# Patient Record
Sex: Male | Born: 1960 | Race: White | Hispanic: No | Smoking: Current every day smoker
Health system: Southern US, Community
[De-identification: ages and names within clinical notes are randomized; demographics above are authoritative.]

## PROBLEM LIST (undated history)

## (undated) DIAGNOSIS — H409 Unspecified glaucoma: Secondary | ICD-10-CM

## (undated) DIAGNOSIS — J439 Emphysema, unspecified: Secondary | ICD-10-CM

## (undated) DIAGNOSIS — M1990 Unspecified osteoarthritis, unspecified site: Secondary | ICD-10-CM

## (undated) DIAGNOSIS — A159 Respiratory tuberculosis unspecified: Secondary | ICD-10-CM

## (undated) DIAGNOSIS — R918 Other nonspecific abnormal finding of lung field: Secondary | ICD-10-CM

## (undated) DIAGNOSIS — J45909 Unspecified asthma, uncomplicated: Secondary | ICD-10-CM

## (undated) DIAGNOSIS — J449 Chronic obstructive pulmonary disease, unspecified: Secondary | ICD-10-CM

## (undated) DIAGNOSIS — K759 Inflammatory liver disease, unspecified: Secondary | ICD-10-CM

## (undated) DIAGNOSIS — F1910 Other psychoactive substance abuse, uncomplicated: Secondary | ICD-10-CM

## (undated) DIAGNOSIS — I10 Essential (primary) hypertension: Secondary | ICD-10-CM

## (undated) HISTORY — PX: EYE SURGERY: SHX253

## (undated) HISTORY — DX: Essential (primary) hypertension: I10

## (undated) HISTORY — DX: Chronic obstructive pulmonary disease, unspecified: J44.9

## (undated) HISTORY — DX: Unspecified glaucoma: H40.9

## (undated) HISTORY — DX: Emphysema, unspecified: J43.9

## (undated) HISTORY — DX: Other psychoactive substance abuse, uncomplicated: F19.10

## (undated) HISTORY — PX: HERNIA REPAIR: SHX51

---

## 2015-08-03 ENCOUNTER — Encounter: Payer: Self-pay | Admitting: Physician Assistant

## 2015-08-03 ENCOUNTER — Ambulatory Visit: Payer: Self-pay | Admitting: Physician Assistant

## 2015-08-03 VITALS — BP 120/76 | HR 56 | Temp 97.9°F | Ht 70.5 in | Wt 168.2 lb

## 2015-08-03 DIAGNOSIS — Z13220 Encounter for screening for lipoid disorders: Secondary | ICD-10-CM

## 2015-08-03 DIAGNOSIS — F17218 Nicotine dependence, cigarettes, with other nicotine-induced disorders: Secondary | ICD-10-CM

## 2015-08-03 DIAGNOSIS — R21 Rash and other nonspecific skin eruption: Secondary | ICD-10-CM

## 2015-08-03 DIAGNOSIS — Z125 Encounter for screening for malignant neoplasm of prostate: Secondary | ICD-10-CM

## 2015-08-03 DIAGNOSIS — J449 Chronic obstructive pulmonary disease, unspecified: Secondary | ICD-10-CM

## 2015-08-03 DIAGNOSIS — F1020 Alcohol dependence, uncomplicated: Secondary | ICD-10-CM | POA: Insufficient documentation

## 2015-08-03 DIAGNOSIS — F1021 Alcohol dependence, in remission: Secondary | ICD-10-CM

## 2015-08-03 DIAGNOSIS — Z131 Encounter for screening for diabetes mellitus: Secondary | ICD-10-CM

## 2015-08-03 LAB — GLUCOSE, POCT (MANUAL RESULT ENTRY): POC Glucose: 72 mg/dl (ref 70–99)

## 2015-08-03 MED ORDER — ALBUTEROL SULFATE (2.5 MG/3ML) 0.083% IN NEBU: 2.5000 mg | INHALATION_SOLUTION | Freq: Four times a day (QID) | RESPIRATORY_TRACT | Status: DC | PRN

## 2015-08-03 MED ORDER — MOMETASONE FURO-FORMOTEROL FUM 200-5 MCG/ACT IN AERO: 2.0000 | INHALATION_SPRAY | Freq: Two times a day (BID) | RESPIRATORY_TRACT | Status: DC

## 2015-08-03 MED ORDER — TRIAMCINOLONE ACETONIDE 0.1 % EX CREA: 1.0000 "application " | TOPICAL_CREAM | Freq: Two times a day (BID) | CUTANEOUS | Status: DC

## 2015-08-03 NOTE — Progress Notes (Signed)
BP 120/76 mmHg  Pulse 56  Temp(Src) 97.9 F (36.6 C)  Ht 5' 10.5" (1.791 m)  Wt 168 lb 3.2 oz (76.295 kg)  BMI 23.79 kg/m2  SpO2 97%   Subjective:    Patient ID: Derek [name], male    DOB: 1960-10-20, 54 y.o.   MRN: [id number]  HPI: Derek [name] is a 54 y.o. male presenting on 08/03/2015 for New Patient (Initial Visit); Rash; and Hepatitis C   HPI  Chief Complaint  Patient presents with  . New Patient (Initial Visit)  . Rash    pt states he is sensitive to the sun. pt has rash on his back.  . Hepatitis C    pt thinks he may have hep c. due to pt being a previous heavy drinker and "turning yellow" 27 years ago.     -Pt is going to [facility] (who is prescribing his Naltrexone) -pt says the rash on his back comes and goes. He says it itches and burns -he states he is sensitive to the sun.  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 Interim medical history since our last visit reviewed. Allergies and medications reviewed and updated.   Current outpatient prescriptions:  .  albuterol (PROVENTIL) (2.5 MG/3ML) 0.083% nebulizer solution, Take 2.5 mg by nebulization every 6 (six) hours as needed for wheezing or shortness of breath., Disp: , Rfl:  .  Naltrexone HCl (REVIA PO), Take by mouth daily., Disp: , Rfl:    Review of Systems  Constitutional: Negative for fever, chills, diaphoresis, appetite change, fatigue and unexpected weight change.  HENT: Positive for congestion. Negative for dental problem, drooling, ear pain, facial swelling, hearing loss, mouth sores, sneezing, sore throat, trouble swallowing and voice change.   Eyes: Negative for pain, discharge, redness, itching and visual disturbance.   Respiratory: Positive for cough, shortness of breath and wheezing. Negative for choking.   Cardiovascular: Negative for chest pain, palpitations and leg swelling.  Gastrointestinal: Negative for vomiting, abdominal pain, diarrhea, constipation and blood in stool.  Endocrine: Positive for cold intolerance and heat intolerance. Negative for polydipsia.  Genitourinary: Negative for dysuria, hematuria and decreased urine volume.  Musculoskeletal: Positive for back pain. Negative for arthralgias and gait problem.  Skin: Positive for rash.  Allergic/Immunologic: Negative for environmental allergies.  Neurological: Negative for seizures, syncope, light-headedness and headaches.  Hematological: Negative for adenopathy.  Psychiatric/Behavioral: Negative for suicidal ideas, dysphoric mood and agitation. The patient is not nervous/anxious.     Per HPI unless specifically indicated above     Objective:    BP 120/76 mmHg  Pulse 56  Temp(Src) 97.9 F (36.6 C)  Ht 5' 10.5" (1.791 m)  Wt 168 lb 3.2 oz (76.295 kg)  BMI 23.79 kg/m2  SpO2 97%  Wt Readings from Last 3 Encounters:  08/03/15 168 lb 3.2 oz (76.295 kg)    Physical Exam  Constitutional: He is oriented to person, place, and time. Vital signs are normal.  Appears older than stated age  HENT:  Head: Normocephalic and atraumatic.  Mouth/Throat: Oropharynx is clear and moist. No oropharyngeal exudate.  Eyes: Conjunctivae and EOM are normal. Pupils are equal, round, and reactive to light.  Neck: Neck supple.  No thyromegaly present.  Cardiovascular: Normal rate and regular rhythm.   Pulmonary/Chest: Effort normal and breath sounds normal. He has no wheezes. He has no rales.  Abdominal: Soft. Bowel sounds are normal. He exhibits no mass. There is no hepatosplenomegaly. There is no tenderness.  Musculoskeletal: He exhibits no edema.  Lymphadenopathy:    He has no cervical adenopathy.  Neurological: He is alert and oriented to person,  place, and time.  Skin: Skin is warm and dry. Rash noted.  Significant sun damage over all exposed skin.  Possible rosecea on nose with early rhinophyma.   There are dry areas on L flank and lower back which also have multiple tiny scabs and some excoriation.    Psychiatric: He has a normal mood and affect. His behavior is normal. Thought content normal.  Vitals reviewed.   R neck mass- 2 cm soft mobile nontender   Results for orders placed or performed in visit on 08/03/15  POCT Glucose (CBG)  Result Value Ref Range   POC Glucose 72 70 - 99 mg/dl      Assessment & Plan:   Encounter Diagnoses  Name Primary?  . Alcoholism in remission ([place])   . Chronic obstructive pulmonary disease, unspecified COPD type ([place]) Yes  . Cigarette nicotine dependence with other nicotine-induced disorder   . Screening for diabetes mellitus   . Screening cholesterol level   . Screening for prostate cancer   . Rash and nonspecific skin eruption     -Get baseline labs including hepatitis screen -Needs cxr -pt given [facility] discount application  -Counseled smoking cessation -rx TAC for rash -Gave sample dulera. Set up for [facility] and order from there. -rx Albuterol for nebs sent to [facility] -Likely lipoma on neck-  On list for eval but farther down priority list -F/u 1 mo

## 2015-08-03 NOTE — Patient Instructions (Addendum)
-get blood labs done -turn in [facility] application -get nebulizer medicine and cream for rash from pharmacy -get chest x-ray done   Smoking Cessation, Tips for Success If you are ready to quit smoking, congratulations! You have chosen to help yourself be healthier. Cigarettes bring nicotine, tar, carbon monoxide, and other irritants into your body. Your lungs, heart, and blood vessels will be able to work better without these poisons. There are many different ways to quit smoking. Nicotine gum, nicotine patches, a nicotine inhaler, or nicotine nasal spray can help with physical craving. Hypnosis, support groups, and medicines help break the habit of smoking. WHAT THINGS CAN I DO TO MAKE QUITTING EASIER?  Here are some tips to help you quit for good:  Pick a date when you will quit smoking completely. Tell all of your friends and family about your plan to quit on that date.  Do not try to slowly cut down on the number of cigarettes you are smoking. Pick a quit date and quit smoking completely starting on that day.  Throw away all cigarettes.   Clean and remove all ashtrays from your home, work, and car.  On a card, write down your reasons for quitting. Carry the card with you and read it when you get the urge to smoke.  Cleanse your body of nicotine. Drink enough water and fluids to keep your urine clear or pale yellow. Do this after quitting to flush the nicotine from your body.  Learn to predict your moods. Do not let a bad situation be your excuse to have a cigarette. Some situations in your life might tempt you into wanting a cigarette.  Never have "just one" cigarette. It leads to wanting another and another. Remind yourself of your decision to quit.  Change habits associated with smoking. If you smoked while driving or when feeling stressed, try other activities to replace smoking. Stand up when drinking your coffee. Brush your teeth after eating. Sit in a different chair when  you read the paper. Avoid alcohol while trying to quit, and try to drink fewer caffeinated beverages. Alcohol and caffeine may urge you to smoke.  Avoid foods and drinks that can trigger a desire to smoke, such as sugary or spicy foods and alcohol.  Ask people who smoke not to smoke around you.  Have something planned to do right after eating or having a cup of coffee. For example, plan to take a walk or exercise.  Try a relaxation exercise to calm you down and decrease your stress. Remember, you may be tense and nervous for the first 2 weeks after you quit, but this will pass.  Find new activities to keep your hands busy. Play with a pen, coin, or rubber band. Doodle or draw things on paper.  Brush your teeth right after eating. This will help cut down on the craving for the taste of tobacco after meals. You can also try mouthwash.   Use oral substitutes in place of cigarettes. Try using lemon drops, carrots, cinnamon sticks, or chewing gum. Keep them handy so they are available when you have the urge to smoke.  When you have the urge to smoke, try deep breathing.  Designate your home as a nonsmoking area.  If you are a heavy smoker, ask your health care provider about a prescription for nicotine chewing gum. It can ease your withdrawal from nicotine.  Reward yourself. Set aside the cigarette money you save and buy yourself something nice.  Look for  support from others. Join a support group or smoking cessation program. Ask someone at home or at work to help you with your plan to quit smoking.  Always ask yourself, "Do I need this cigarette or is this just a reflex?" Tell yourself, "Today, I choose not to smoke," or "I do not want to smoke." You are reminding yourself of your decision to quit.  Do not replace cigarette smoking with electronic cigarettes (commonly called e-cigarettes). The safety of e-cigarettes is unknown, and some may contain harmful chemicals.  If you relapse, do  not give up! Plan ahead and think about what you will do the next time you get the urge to smoke. HOW WILL I FEEL WHEN I QUIT SMOKING? You may have symptoms of withdrawal because your body is used to nicotine (the addictive substance in cigarettes). You may crave cigarettes, be irritable, feel very hungry, cough often, get headaches, or have difficulty concentrating. The withdrawal symptoms are only temporary. They are strongest when you first quit but will go away within 10-14 days. When withdrawal symptoms occur, stay in control. Think about your reasons for quitting. Remind yourself that these are signs that your body is healing and getting used to being without cigarettes. Remember that withdrawal symptoms are easier to treat than the major diseases that smoking can cause.  Even after the withdrawal is over, expect periodic urges to smoke. However, these cravings are generally short lived and will go away whether you smoke or not. Do not smoke! WHAT RESOURCES ARE AVAILABLE TO HELP ME QUIT SMOKING? Your health care provider can direct you to community resources or hospitals for support, which may include:  Group support.  Education.  Hypnosis.  Therapy.   This information is not intended to replace advice given to you by your health care provider. Make sure you discuss any questions you have with your health care provider.   Document Released: 05/12/2004 Document Revised: 09/04/2014 Document Reviewed: 01/30/2013 Elsevier Interactive Patient Education [employer].

## 2015-08-12 LAB — CBC WITH DIFFERENTIAL/PLATELET
BASOS ABS: 0.1 10*3/uL (ref 0.0–0.1)
BASOS PCT: 1 % (ref 0–1)
EOS PCT: 4 % (ref 0–5)
Eosinophils Absolute: 0.3 10*3/uL (ref 0.0–0.7)
HEMATOCRIT: 50.8 % (ref 39.0–52.0)
Hemoglobin: 17.1 g/dL — ABNORMAL HIGH (ref 13.0–17.0)
LYMPHS PCT: 31 % (ref 12–46)
Lymphs Abs: 2.6 10*3/uL (ref 0.7–4.0)
MCH: 33.1 pg (ref 26.0–34.0)
MCHC: 33.7 g/dL (ref 30.0–36.0)
MCV: 98.3 fL (ref 78.0–100.0)
MPV: 10.4 fL (ref 8.6–12.4)
Monocytes Absolute: 0.9 10*3/uL (ref 0.1–1.0)
Monocytes Relative: 11 % (ref 3–12)
Neutro Abs: 4.5 10*3/uL (ref 1.7–7.7)
Neutrophils Relative %: 53 % (ref 43–77)
PLATELETS: 341 10*3/uL (ref 150–400)
RBC: 5.17 MIL/uL (ref 4.22–5.81)
RDW: 13.4 % (ref 11.5–15.5)
WBC: 8.5 10*3/uL (ref 4.0–10.5)

## 2015-08-13 LAB — LIPID PANEL
CHOL/HDL RATIO: 8.8 ratio — AB (ref <=5.0)
CHOLESTEROL: 149 mg/dL (ref 125–200)
HDL: 17 mg/dL — ABNORMAL LOW (ref >=40)
LDL Cholesterol: 111 mg/dL (ref <130)
TRIGLYCERIDES: 105 mg/dL (ref <150)
VLDL: 21 mg/dL (ref <30)

## 2015-08-13 LAB — PSA: PSA: 0.78 ng/mL (ref <=4.00)

## 2015-08-13 LAB — COMPLETE METABOLIC PANEL WITH GFR
ALBUMIN: 3.7 g/dL (ref 3.6–5.1)
ALK PHOS: 76 U/L (ref 40–115)
ALT: 86 U/L — AB (ref 9–46)
AST: 49 U/L — ABNORMAL HIGH (ref 10–35)
BUN: 9 mg/dL (ref 7–25)
CALCIUM: 9.6 mg/dL (ref 8.6–10.3)
CHLORIDE: 103 mmol/L (ref 98–110)
CO2: 31 mmol/L (ref 20–31)
CREATININE: 0.77 mg/dL (ref 0.70–1.33)
GFR, Est Non African American: >89 mL/min (ref >=60)
Glucose, Bld: 91 mg/dL (ref 65–99)
POTASSIUM: 4.9 mmol/L (ref 3.5–5.3)
Sodium: 142 mmol/L (ref 135–146)
Total Bilirubin: 0.6 mg/dL (ref 0.2–1.2)
Total Protein: 7.9 g/dL (ref 6.1–8.1)

## 2015-08-13 LAB — HEPATITIS PANEL, ACUTE
HCV AB: REACTIVE — AB
HEP B C IGM: NONREACTIVE
HEP B S AG: NEGATIVE
Hep A IgM: NONREACTIVE

## 2015-08-15 LAB — HEPATITIS C RNA QUANTITATIVE
HCV QUANT: 1530809 [IU]/mL — AB (ref <15)
HCV Quantitative Log: 6.18 {Log} — ABNORMAL HIGH (ref <1.18)

## 2015-09-02 ENCOUNTER — Encounter: Payer: Self-pay | Admitting: Physician Assistant

## 2015-09-02 ENCOUNTER — Ambulatory Visit: Payer: Self-pay | Admitting: Physician Assistant

## 2015-09-02 VITALS — BP 112/72 | HR 87 | Temp 98.1°F | Ht 70.5 in | Wt 166.0 lb

## 2015-09-02 DIAGNOSIS — F1021 Alcohol dependence, in remission: Secondary | ICD-10-CM

## 2015-09-02 DIAGNOSIS — F17218 Nicotine dependence, cigarettes, with other nicotine-induced disorders: Secondary | ICD-10-CM

## 2015-09-02 DIAGNOSIS — B1920 Unspecified viral hepatitis C without hepatic coma: Secondary | ICD-10-CM

## 2015-09-02 DIAGNOSIS — J449 Chronic obstructive pulmonary disease, unspecified: Secondary | ICD-10-CM

## 2015-09-02 MED ORDER — TRIAMCINOLONE ACETONIDE 0.5 % EX CREA: 1.0000 "application " | TOPICAL_CREAM | Freq: Every day | CUTANEOUS | Status: DC

## 2015-09-02 NOTE — Progress Notes (Signed)
BP 112/72 mmHg  Pulse 87  Temp(Src) 98.1 F (36.7 C)  Ht 5' 10.5" (1.791 m)  Wt 166 lb (75.297 kg)  BMI 23.47 kg/m2  SpO2 92%   Subjective:    Patient ID: Derek [name], male    DOB: May 14, 1961, 55 y.o.   MRN: [id number]  HPI: Linn Goetze is a 55 y.o. male presenting on 09/02/2015 for COPD   HPI Pt states breathing much better on dulera.    Pt is still off etoh.  States dry x 96 days now.  Relevant past medical, surgical, family and social history reviewed and updated as indicated. Interim medical history since our last visit reviewed. Allergies and medications reviewed and updated.  Review of Systems  Constitutional: Positive for fever, chills, diaphoresis, appetite change, fatigue and unexpected weight change.  HENT: Positive for congestion. Negative for dental problem, drooling, ear pain, facial swelling, hearing loss, mouth sores, sneezing, sore throat, trouble swallowing and voice change.   Eyes: Negative for pain, discharge, redness, itching and visual disturbance.  Respiratory: Positive for cough, shortness of breath and wheezing. Negative for choking.   Cardiovascular: Positive for chest pain. Negative for palpitations and leg swelling.  Gastrointestinal: Negative for vomiting, abdominal pain, diarrhea, constipation and blood in stool.  Endocrine: Positive for cold intolerance, heat intolerance and polydipsia.  Genitourinary: Negative for dysuria, hematuria and decreased urine volume.  Musculoskeletal: Positive for back pain. Negative for arthralgias and gait problem.  Skin: Positive for rash.  Allergic/Immunologic: Negative for environmental allergies.  Neurological: Negative for seizures, syncope, light-headedness and headaches.  Hematological: Negative for adenopathy.  Psychiatric/Behavioral: Positive for dysphoric mood. Negative for suicidal ideas and agitation. The patient is nervous/anxious.     Per HPI unless specifically indicated above    Objective:    BP 112/72 mmHg  Pulse 87  Temp(Src) 98.1 F (36.7 C)  Ht 5' 10.5" (1.791 m)  Wt 166 lb (75.297 kg)  BMI 23.47 kg/m2  SpO2 92%  Wt Readings from Last 3 Encounters:  09/02/15 166 lb (75.297 kg)  08/03/15 168 lb 3.2 oz (76.295 kg)    Physical Exam  Constitutional: He is oriented to person, place, and time. He appears well-developed and well-nourished.  HENT:  Head: Normocephalic and atraumatic.  Neck: Neck supple.  Cardiovascular: Normal rate and regular rhythm.   Pulmonary/Chest: Effort normal. No respiratory distress. He has wheezes.  Abdominal: Soft. Bowel sounds are normal. There is no tenderness.  Musculoskeletal: He exhibits no edema.  Lymphadenopathy:    He has no cervical adenopathy.  Neurological: He is alert and oriented to person, place, and time.  Skin: Skin is warm and dry.  Psychiatric: He has a normal mood and affect. His behavior is normal.  Vitals reviewed.   Results for orders placed or performed in visit on 08/03/15  Lipid Profile  Result Value Ref Range   Cholesterol 149 125 - 200 mg/dL   Triglycerides 105 <150 mg/dL   HDL 17 (L) >=40 mg/dL   Total CHOL/HDL Ratio 8.8 (H) <=5.0 Ratio   VLDL 21 <30 mg/dL   LDL Cholesterol 111 <130 mg/dL  COMPLETE METABOLIC PANEL WITH GFR  Result Value Ref Range   Sodium 142 135 - 146 mmol/L   Potassium 4.9 3.5 - 5.3 mmol/L   Chloride 103 98 - 110 mmol/L   CO2 31 20 - 31 mmol/L   Glucose, Bld 91 65 - 99 mg/dL   BUN 9 7 - 25 mg/dL   Creat 0.77 0.70 -  1.33 mg/dL   Total Bilirubin 0.6 0.2 - 1.2 mg/dL   Alkaline Phosphatase 76 40 - 115 U/L   AST 49 (H) 10 - 35 U/L   ALT 86 (H) 9 - 46 U/L   Total Protein 7.9 6.1 - 8.1 g/dL   Albumin 3.7 3.6 - 5.1 g/dL   Calcium 9.6 8.6 - 10.3 mg/dL   GFR, Est African American >89 >=60 mL/min   GFR, Est Non African American >89 >=60 mL/min  CBC w/Diff/Platelet  Result Value Ref Range   WBC 8.5 4.0 - 10.5 K/uL   RBC 5.17 4.22 - 5.81 MIL/uL   Hemoglobin 17.1 (H) 13.0  - 17.0 g/dL   HCT 50.8 39.0 - 52.0 %   MCV 98.3 78.0 - 100.0 fL   MCH 33.1 26.0 - 34.0 pg   MCHC 33.7 30.0 - 36.0 g/dL   RDW 13.4 11.5 - 15.5 %   Platelets 341 150 - 400 K/uL   MPV 10.4 8.6 - 12.4 fL   Neutrophils Relative % 53 43 - 77 %   Neutro Abs 4.5 1.7 - 7.7 K/uL   Lymphocytes Relative 31 12 - 46 %   Lymphs Abs 2.6 0.7 - 4.0 K/uL   Monocytes Relative 11 3 - 12 %   Monocytes Absolute 0.9 0.1 - 1.0 K/uL   Eosinophils Relative 4 0 - 5 %   Eosinophils Absolute 0.3 0.0 - 0.7 K/uL   Basophils Relative 1 0 - 1 %   Basophils Absolute 0.1 0.0 - 0.1 K/uL   Smear Review Criteria for review not met   Hepatitis, Acute  Result Value Ref Range   Hepatitis B Surface Ag NEGATIVE NEGATIVE   HCV Ab REACTIVE (A) NEGATIVE   Hep B C IgM NON REACTIVE NON REACTIVE   Hep A IgM NON REACTIVE NON REACTIVE  PSA  Result Value Ref Range   PSA 0.78 <=4.00 ng/mL  Hepatitis C RNA quantitative  Result Value Ref Range   HCV Quantitative 3151761 (H) <15 IU/mL   HCV Quantitative Log 6.18 (H) <1.18 log 10  POCT Glucose (CBG)  Result Value Ref Range   POC Glucose 72 70 - 99 mg/dl      Assessment & Plan:   Encounter Diagnoses  Name Primary?  . Chronic obstructive pulmonary disease, unspecified COPD type ([place]) Yes  . Cigarette nicotine dependence with other nicotine-induced disorder   . Hepatitis C virus infection without hepatic coma, unspecified chronicity   . Alcoholism in remission [facility])      -reviewed labs with pt  -Pt to get CXR done. -Refer to GI for treatment hepatitis -Counseled on smoking cessation -counseled on Lowfat diet -f/u 3 mo. RTO sooner prn

## 2015-09-02 NOTE — Patient Instructions (Signed)

## 2015-09-06 ENCOUNTER — Ambulatory Visit (HOSPITAL_COMMUNITY)
Admission: RE | Admit: 2015-09-06 | Discharge: 2015-09-06 | Disposition: A | Discharge status: Home / Self Care | Payer: Self-pay | Source: Ambulatory Visit | Attending: Physician Assistant | Admitting: Physician Assistant

## 2015-09-06 DIAGNOSIS — J45909 Unspecified asthma, uncomplicated: Secondary | ICD-10-CM | POA: Insufficient documentation

## 2015-09-06 DIAGNOSIS — R918 Other nonspecific abnormal finding of lung field: Secondary | ICD-10-CM | POA: Insufficient documentation

## 2015-09-06 DIAGNOSIS — B182 Chronic viral hepatitis C: Secondary | ICD-10-CM | POA: Insufficient documentation

## 2015-09-06 DIAGNOSIS — R05 Cough: Secondary | ICD-10-CM | POA: Insufficient documentation

## 2015-09-20 ENCOUNTER — Encounter: Payer: Self-pay | Admitting: Gastroenterology

## 2015-10-07 ENCOUNTER — Ambulatory Visit (INDEPENDENT_AMBULATORY_CARE_PROVIDER_SITE_OTHER): Payer: Self-pay | Admitting: Gastroenterology

## 2015-10-07 ENCOUNTER — Other Ambulatory Visit: Payer: Self-pay

## 2015-10-07 ENCOUNTER — Encounter: Payer: Self-pay | Admitting: Gastroenterology

## 2015-10-07 VITALS — BP 128/80 | HR 77 | Temp 96.9°F | Ht 72.0 in | Wt 167.6 lb

## 2015-10-07 DIAGNOSIS — Z1211 Encounter for screening for malignant neoplasm of colon: Secondary | ICD-10-CM

## 2015-10-07 DIAGNOSIS — B182 Chronic viral hepatitis C: Secondary | ICD-10-CM

## 2015-10-07 NOTE — Progress Notes (Signed)
cc'ed to pcp °

## 2015-10-07 NOTE — Assessment & Plan Note (Addendum)
LIKELY CONTRACTED > 20 YRS AND ASSOCIATED WITH ETOH ABUSE AND ADDITIONAL POLYSUBSTANCE USE.  NEED HEP C GENOTYPE & PT/INR, HEP B sAb AND TOTAL HEP A Ab. RUQ U/S TO ASSESS FOR CIRRHOSIS. EGD TO SCREEN FOR BARRETT'S AND VARICES. DISCUSSED PROCEDURE, BENEFITS, & RISKS: < 1% chance of medication reaction, PERFORATION, OR bleeding. FOLLOW UP IN 3 MOS.   GREATER THAN 50% WAS SPENT IN COUNSELING & COORDINATION OF CARE WITH THE PATIENT: DISCUSSED DIFFERENTIAL DIAGNOSIS, PROCEDURE, BENEFITS, RISKS, AND MANAGEMENT OF HEPATITIS C, BARRETT'S ESOPHAGUS, COLON CANCER SCREENING. TOTAL ENCOUNTER TIME: 41 MINS.

## 2015-10-07 NOTE — Patient Instructions (Addendum)
Your ENDOSCOPY NEEDS TO BE WITH PROPOFOL. YOU WILL NEED TO COME FOR A PREOP VISIT AND THEY WILL DRAW YOUR LABS AT THAT TIME. YOUR ENDOSCOPY WIILL BE MAR 14.  COMPLETE ultrasound IN 2-3 WEEKS.  FOLLOW UP IN 4 MOS.

## 2015-10-07 NOTE — Progress Notes (Signed)
ON RECALL  °

## 2015-10-07 NOTE — Assessment & Plan Note (Addendum)
AVERAGE RISK. NO WARNING SIGNS/SYMPTOMS  TCS W/ MAC DUE TO POLYSUBSTANCE ABUSE. LATE MAR 2017 OR EARLY APR 2017. DISCUSSED PROCEDURE, BENEFITS, & RISKS: < 1% chance of medication reaction, bleeding, perforation, or rupture of spleen/liver. APPLYING FOR [facility] ASSISTANCE FOLLOW UP IN 4 MOS.

## 2015-10-07 NOTE — Progress Notes (Addendum)
Subjective:    Patient ID: Derek [name], male    DOB: 1960-09-30, 55 y.o.   MRN: [id number]  Soyla Dryer, PA-C  HPI Was using MDA and cocaine IN THE 70s. Grew up in [place], AT AGE 17 TURNED [place] 2-3 WEEKS. WENT TO EMERGENCY CARE. ETOH: DRY FOR 126 DAYS. MAY HAVE DARK URINE. NEVER HAD A COLONOSCOPY. BMs: REGULAR. HAS COPD: 1-2 PKS/DAY FOR 40 YRS. USU WEIGHT: 160-170 LBS. MARIJUANA YESTERDAY. NO OTHER RECREATIONAL DRUGS. RARE HEARTBURN.  PT DENIES FEVER, CHILLS, HEMATOCHEZIA, nausea, vomiting, melena, diarrhea, CHEST PAIN, SHORTNESS OF BREATH, WEIGHT LOSS, CHANGE IN BOWEL IN HABITS, constipation, abdominal pain, problems swallowing, OR problems with sedation.  Past Medical History  Diagnosis Date  . COPD (chronic obstructive pulmonary disease) ([place])   . Substance abuse    Past Surgical History  Procedure Laterality Date  . Hernia repair Left   . Eye surgery Right    No Known Allergies  Current Outpatient Prescriptions  Medication Sig Dispense Refill  . albuterol (PROVENTIL) (2.5 MG/3ML) 0.083% nebulizer solution Take 3 mLs (2.5 mg total) by nebulization every 6 (six) hours as needed for wheezing or shortness of breath.    . mometasone-formoterol (DULERA) 200-5 MCG/ACT AERO Inhale 2 puffs into the lungs 2 (two) times daily.    . Naltrexone HCl (REVIA PO) Take by mouth daily. TO HELP IN STOP DRINKING    .      Marland Kitchen       Family History  Problem Relation Age of Onset  . Cancer Mother     lymph nodes  . Ulcers Father   . Cholecystitis Father   . Colon cancer Neg Hx   . Colon polyps Neg Hx      Social History   Social History  . Marital Status: Married    Spouse Name: N/A  . Number of Children: N/A  . Years of Education: N/A   Social History Main Topics  . Smoking status: Current Every Day Smoker -- 2.00 packs/day for 40 years    Types: Cigarettes  . Smokeless tobacco: Never Used  . Alcohol Use: No     Comment: hx alcoholism. sober  since 04-2015  . Drug Use: Yes    Special: Marijuana, Cocaine  . Sexual Activity: Not Asked   Other Topics Concern  . None   Social History Narrative   SON LIVES IN [place], [place] LIVES IN [place] AND WORKING IN [employer]. BORN AND RAISED IN [employer]. 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        Review of Systems PER HPI OTHERWISE ALL SYSTEMS ARE NEGATIVE.    Objective:   Physical Exam  Constitutional: He is oriented to person, place, and time. He appears well-developed and well-nourished. No distress.  HENT:  Head: Normocephalic and atraumatic.  Mouth/Throat: Oropharynx is clear and moist. No oropharyngeal exudate.  SCLERA ANICTERIC   Eyes: Pupils are equal, round, and reactive to light. No scleral icterus.  Neck: Normal range of motion. Neck supple.  Cardiovascular: Normal rate, regular rhythm and normal heart sounds.   Pulmonary/Chest: Effort normal and breath sounds normal. No respiratory distress.  Abdominal: Soft. Bowel sounds are normal. He exhibits no distension. There is no tenderness.  Musculoskeletal: He exhibits no edema.  Lymphadenopathy:    He has no cervical adenopathy.  Neurological: He is alert and oriented to person, place, and time.  NO FOCAL DEFICITS  Psychiatric: He has  a normal mood and affect.  Vitals reviewed.     Assessment & Plan:

## 2015-10-11 ENCOUNTER — Ambulatory Visit (HOSPITAL_COMMUNITY)
Admission: RE | Admit: 2015-10-11 | Discharge: 2015-10-11 | Disposition: A | Discharge status: Home / Self Care | Payer: Self-pay | Source: Ambulatory Visit | Attending: Gastroenterology | Admitting: Gastroenterology

## 2015-10-11 DIAGNOSIS — B182 Chronic viral hepatitis C: Secondary | ICD-10-CM | POA: Insufficient documentation

## 2015-10-20 ENCOUNTER — Telehealth: Payer: Self-pay | Admitting: Gastroenterology

## 2015-10-20 NOTE — Telephone Encounter (Signed)
PLEASE CALL PT. HIS U/S SHOWS GALLSTONES. HE DOES NOT HAVE CIRRHOSIS.

## 2015-10-20 NOTE — Telephone Encounter (Signed)
Tried to call and could not leave a message.  

## 2015-10-21 NOTE — Telephone Encounter (Signed)
LMOM to call. Letter mailed to call also.  

## 2015-11-03 ENCOUNTER — Other Ambulatory Visit: Payer: Self-pay

## 2015-11-03 NOTE — Patient Instructions (Signed)
Derek [name]  11/03/2015     @PREFPERIOPPHARMACY @   Your procedure is scheduled on 11/09/15.  Report to [facility] at 9:00 A.M.  Call this number if you have problems the morning of surgery:  [phone]   Remember:  Do not eat food or drink liquids after midnight.  Take these medicines the morning of surgery with A SIP OF WATER NA - Take Dulera inhaler and Albuterol nebulizer   Do not wear jewelry, make-up or nail polish.  Do not wear lotions, powders, or perfumes.  You may wear deodorant.  Do not shave 48 hours prior to surgery.  Men may shave face and neck.  Do not bring valuables to the hospital.  [facility] is not responsible for any belongings or valuables.  Contacts, dentures or bridgework may not be worn into surgery.  Leave your suitcase in the car.  After surgery it may be brought to your room.  For patients admitted to the hospital, discharge time will be determined by your treatment team.  Patients discharged the day of surgery will not be allowed to drive home.    Please read over the following fact sheets that you were given. Anesthesia Post-op Instructions    PATIENT INSTRUCTIONS POST-ANESTHESIA  IMMEDIATELY FOLLOWING SURGERY:  Do not drive or operate machinery for the first twenty four hours after surgery.  Do not make any important decisions for twenty four hours after surgery or while taking narcotic pain medications or sedatives.  If you develop intractable nausea and vomiting or a severe headache please notify your doctor immediately.  FOLLOW-UP:  Please make an appointment with your surgeon as instructed. You do not need to follow up with anesthesia unless specifically instructed to do so.  WOUND CARE INSTRUCTIONS (if applicable):  Keep a dry clean dressing on the anesthesia/puncture wound site if there is drainage.  Once the wound has quit draining you may leave it open to air.  Generally you should leave the bandage intact for twenty four hours  unless there is drainage.  If the epidural site drains for more than 36-48 hours please call the anesthesia department.  QUESTIONS?:  Please feel free to call your physician or the hospital operator if you have any questions, and they will be happy to assist you.         Esophagogastroduodenoscopy Esophagogastroduodenoscopy (EGD) is a procedure that is used to examine the lining of the esophagus, stomach, and first part of the small intestine (duodenum). A long, flexible, lighted tube with a camera attached (endoscope) is inserted down the throat to view these organs. This procedure is done to detect problems or abnormalities, such as inflammation, bleeding, ulcers, or growths, in order to treat them. The procedure lasts 5-20 minutes. It is usually an outpatient procedure, but it may need to be performed in a hospital in emergency cases. LET [facility] CARE PROVIDER KNOW ABOUT:  Any allergies you have.  All medicines you are taking, including vitamins, herbs, eye drops, creams, and over-the-counter medicines.  Previous problems you or members of your family have had with the use of anesthetics.  Any blood disorders you have.  Previous surgeries you have had.  Medical conditions you have. RISKS AND COMPLICATIONS Generally, this is a safe procedure. However, problems can occur and include:  Infection.  Bleeding.  Tearing (perforation) of the esophagus, stomach, or duodenum.  Difficulty breathing or not being able to breathe.  Excessive sweating.  Spasms of the larynx.  Slowed heartbeat.  Low blood pressure. BEFORE THE PROCEDURE  Do not eat or drink anything after midnight on the night before the procedure or as directed by your health care provider.  Do not take your regular medicines before the procedure if your health care provider asks you not to. Ask your health care provider about changing or stopping those medicines.  If you wear dentures, be prepared to remove them  before the procedure.  Arrange for someone to drive you home after the procedure. PROCEDURE  A numbing medicine (local anesthetic) may be sprayed in your throat for comfort and to stop you from gagging or coughing.  You will have an IV tube inserted in a vein in your hand or arm. You will receive medicines and fluids through this tube.  You will be given a medicine to relax you (sedative).  A pain reliever will be given through the IV tube.  A mouth guard may be placed in your mouth to protect your teeth and to keep you from biting on the endoscope.  You will be asked to lie on your left side.  The endoscope will be inserted down your throat and into your esophagus, stomach, and duodenum.  Air will be put through the endoscope to allow your health care provider to clearly view the lining of your esophagus.  The lining of your esophagus, stomach, and duodenum will be examined. During the exam, your health care provider may:  Remove tissue to be examined under a microscope (biopsy) for inflammation, infection, or other medical problems.  Remove growths.  Remove objects (foreign bodies) that are stuck.  Treat any bleeding with medicines or other devices that stop tissues from bleeding (hot cautery, clipping devices).  Widen (dilate) or stretch narrowed areas of your esophagus and stomach.  The endoscope will be withdrawn. AFTER THE PROCEDURE  You will be taken to a recovery area for observation. Your blood pressure, heart rate, breathing rate, and blood oxygen level will be monitored often until the medicines you were given have worn off.  Do not eat or drink anything until the numbing medicine has worn off and your gag reflex has returned. You may choke.  Your health care provider should be able to discuss his or her findings with you. It will take longer to discuss the test results if any biopsies were taken.   This information is not intended to replace advice given to you  by your health care provider. Make sure you discuss any questions you have with your health care provider.   Document Released: 12/15/2004 Document Revised: 09/04/2014 Document Reviewed: 07/17/2012 Elsevier Interactive Patient Education 2016 [employer]. Colonoscopy A colonoscopy is an exam to look at the entire large intestine (colon). This exam can help find problems such as tumors, polyps, inflammation, and areas of bleeding. The exam takes about 1 hour.  LET [facility] CARE PROVIDER KNOW ABOUT:   Any allergies you have.  All medicines you are taking, including vitamins, herbs, eye drops, creams, and over-the-counter medicines.  Previous problems you or members of your family have had with the use of anesthetics.  Any blood disorders you have.  Previous surgeries you have had.  Medical conditions you have. RISKS AND COMPLICATIONS  Generally, this is a safe procedure. However, as with any procedure, complications can occur. Possible complications include:  Bleeding.  Tearing or rupture of the colon wall.  Reaction to medicines given during the exam.  Infection (rare). BEFORE THE PROCEDURE   Ask your health care provider  about changing or stopping your regular medicines.  You may be prescribed an oral bowel prep. This involves drinking a large amount of medicated liquid, starting the day before your procedure. The liquid will cause you to have multiple loose stools until your stool is almost clear or light green. This cleans out your colon in preparation for the procedure.  Do not eat or drink anything else once you have started the bowel prep, unless your health care provider tells you it is safe to do so.  Arrange for someone to drive you home after the procedure. PROCEDURE   You will be given medicine to help you relax (sedative).  You will lie on your side with your knees bent.  A long, flexible tube with a light and camera on the end (colonoscope) will be inserted  through the rectum and into the colon. The camera sends video back to a computer screen as it moves through the colon. The colonoscope also releases carbon dioxide gas to inflate the colon. This helps your health care provider see the area better.  During the exam, your health care provider may take a small tissue sample (biopsy) to be examined under a microscope if any abnormalities are found.  The exam is finished when the entire colon has been viewed. AFTER THE PROCEDURE   Do not drive for 24 hours after the exam.  You may have a small amount of blood in your stool.  You may pass moderate amounts of gas and have mild abdominal cramping or bloating. This is caused by the gas used to inflate your colon during the exam.  Ask when your test results will be ready and how you will get your results. Make sure you get your test results.   This information is not intended to replace advice given to you by your health care provider. Make sure you discuss any questions you have with your health care provider.   Document Released: 08/11/2000 Document Revised: 06/04/2013 Document Reviewed: 04/21/2013 Elsevier Interactive Patient Education [employer].

## 2015-11-03 NOTE — Patient Instructions (Signed)
Derek [name]  11/03/2015     @PREFPERIOPPHARMACY @   Your procedure is scheduled on 11/09/2015.  Report to [facility] at 9:00 A.M.  Call this number if you have problems the morning of surgery:  [phone]   Remember:  Do not eat food or drink liquids after midnight.  Take these medicines the morning of surgery with A SIP OF WATER Dulera and Albuterol Neb   Do not wear jewelry, make-up or nail polish.  Do not wear lotions, powders, or perfumes.  You may wear deodorant.  Do not shave 48 hours prior to surgery.  Men may shave face and neck.  Do not bring valuables to the hospital.  [facility] is not responsible for any belongings or valuables.  Contacts, dentures or bridgework may not be worn into surgery.  Leave your suitcase in the car.  After surgery it may be brought to your room.  For patients admitted to the hospital, discharge time will be determined by your treatment team.  Patients discharged the day of surgery will not be allowed to drive home.    Please read over the following fact sheets that you were given. Anesthesia Post-op Instructions     PATIENT INSTRUCTIONS POST-ANESTHESIA  IMMEDIATELY FOLLOWING SURGERY:  Do not drive or operate machinery for the first twenty four hours after surgery.  Do not make any important decisions for twenty four hours after surgery or while taking narcotic pain medications or sedatives.  If you develop intractable nausea and vomiting or a severe headache please notify your doctor immediately.  FOLLOW-UP:  Please make an appointment with your surgeon as instructed. You do not need to follow up with anesthesia unless specifically instructed to do so.  WOUND CARE INSTRUCTIONS (if applicable):  Keep a dry clean dressing on the anesthesia/puncture wound site if there is drainage.  Once the wound has quit draining you may leave it open to air.  Generally you should leave the bandage intact for twenty four hours unless there is  drainage.  If the epidural site drains for more than 36-48 hours please call the anesthesia department.  QUESTIONS?:  Please feel free to call your physician or the hospital operator if you have any questions, and they will be happy to assist you.      Esophagogastroduodenoscopy Esophagogastroduodenoscopy (EGD) is a procedure that is used to examine the lining of the esophagus, stomach, and first part of the small intestine (duodenum). A long, flexible, lighted tube with a camera attached (endoscope) is inserted down the throat to view these organs. This procedure is done to detect problems or abnormalities, such as inflammation, bleeding, ulcers, or growths, in order to treat them. The procedure lasts 5-20 minutes. It is usually an outpatient procedure, but it may need to be performed in a hospital in emergency cases. LET [facility] CARE PROVIDER KNOW ABOUT:  Any allergies you have.  All medicines you are taking, including vitamins, herbs, eye drops, creams, and over-the-counter medicines.  Previous problems you or members of your family have had with the use of anesthetics.  Any blood disorders you have.  Previous surgeries you have had.  Medical conditions you have. RISKS AND COMPLICATIONS Generally, this is a safe procedure. However, problems can occur and include:  Infection.  Bleeding.  Tearing (perforation) of the esophagus, stomach, or duodenum.  Difficulty breathing or not being able to breathe.  Excessive sweating.  Spasms of the larynx.  Slowed heartbeat.  Low blood pressure. BEFORE THE PROCEDURE  Do not eat or drink anything after midnight on the night before the procedure or as directed by your health care provider.  Do not take your regular medicines before the procedure if your health care provider asks you not to. Ask your health care provider about changing or stopping those medicines.  If you wear dentures, be prepared to remove them before the  procedure.  Arrange for someone to drive you home after the procedure. PROCEDURE  A numbing medicine (local anesthetic) may be sprayed in your throat for comfort and to stop you from gagging or coughing.  You will have an IV tube inserted in a vein in your hand or arm. You will receive medicines and fluids through this tube.  You will be given a medicine to relax you (sedative).  A pain reliever will be given through the IV tube.  A mouth guard may be placed in your mouth to protect your teeth and to keep you from biting on the endoscope.  You will be asked to lie on your left side.  The endoscope will be inserted down your throat and into your esophagus, stomach, and duodenum.  Air will be put through the endoscope to allow your health care provider to clearly view the lining of your esophagus.  The lining of your esophagus, stomach, and duodenum will be examined. During the exam, your health care provider may:  Remove tissue to be examined under a microscope (biopsy) for inflammation, infection, or other medical problems.  Remove growths.  Remove objects (foreign bodies) that are stuck.  Treat any bleeding with medicines or other devices that stop tissues from bleeding (hot cautery, clipping devices).  Widen (dilate) or stretch narrowed areas of your esophagus and stomach.  The endoscope will be withdrawn. AFTER THE PROCEDURE  You will be taken to a recovery area for observation. Your blood pressure, heart rate, breathing rate, and blood oxygen level will be monitored often until the medicines you were given have worn off.  Do not eat or drink anything until the numbing medicine has worn off and your gag reflex has returned. You may choke.  Your health care provider should be able to discuss his or her findings with you. It will take longer to discuss the test results if any biopsies were taken.   This information is not intended to replace advice given to you by your  health care provider. Make sure you discuss any questions you have with your health care provider.   Document Released: 12/15/2004 Document Revised: 09/04/2014 Document Reviewed: 07/17/2012 Elsevier Interactive Patient Education 2016 [employer]. Colonoscopy A colonoscopy is an exam to look at the entire large intestine (colon). This exam can help find problems such as tumors, polyps, inflammation, and areas of bleeding. The exam takes about 1 hour.  LET [facility] CARE PROVIDER KNOW ABOUT:   Any allergies you have.  All medicines you are taking, including vitamins, herbs, eye drops, creams, and over-the-counter medicines.  Previous problems you or members of your family have had with the use of anesthetics.  Any blood disorders you have.  Previous surgeries you have had.  Medical conditions you have. RISKS AND COMPLICATIONS  Generally, this is a safe procedure. However, as with any procedure, complications can occur. Possible complications include:  Bleeding.  Tearing or rupture of the colon wall.  Reaction to medicines given during the exam.  Infection (rare). BEFORE THE PROCEDURE   Ask your health care provider about changing or stopping your regular medicines.  You may be prescribed an oral bowel prep. This involves drinking a large amount of medicated liquid, starting the day before your procedure. The liquid will cause you to have multiple loose stools until your stool is almost clear or light green. This cleans out your colon in preparation for the procedure.  Do not eat or drink anything else once you have started the bowel prep, unless your health care provider tells you it is safe to do so.  Arrange for someone to drive you home after the procedure. PROCEDURE   You will be given medicine to help you relax (sedative).  You will lie on your side with your knees bent.  A long, flexible tube with a light and camera on the end (colonoscope) will be inserted through  the rectum and into the colon. The camera sends video back to a computer screen as it moves through the colon. The colonoscope also releases carbon dioxide gas to inflate the colon. This helps your health care provider see the area better.  During the exam, your health care provider may take a small tissue sample (biopsy) to be examined under a microscope if any abnormalities are found.  The exam is finished when the entire colon has been viewed. AFTER THE PROCEDURE   Do not drive for 24 hours after the exam.  You may have a small amount of blood in your stool.  You may pass moderate amounts of gas and have mild abdominal cramping or bloating. This is caused by the gas used to inflate your colon during the exam.  Ask when your test results will be ready and how you will get your results. Make sure you get your test results.   This information is not intended to replace advice given to you by your health care provider. Make sure you discuss any questions you have with your health care provider.   Document Released: 08/11/2000 Document Revised: 06/04/2013 Document Reviewed: 04/21/2013 Elsevier Interactive Patient Education [employer].

## 2015-11-04 ENCOUNTER — Encounter (HOSPITAL_COMMUNITY)
Admission: RE | Admit: 2015-11-04 | Discharge: 2015-11-04 | Disposition: A | Discharge status: Home / Self Care | Payer: Self-pay | Source: Ambulatory Visit | Attending: Gastroenterology | Admitting: Gastroenterology

## 2015-11-04 ENCOUNTER — Telehealth: Payer: Self-pay

## 2015-11-04 ENCOUNTER — Encounter (HOSPITAL_COMMUNITY): Payer: Self-pay

## 2015-11-04 ENCOUNTER — Other Ambulatory Visit: Payer: Self-pay

## 2015-11-04 DIAGNOSIS — Z01812 Encounter for preprocedural laboratory examination: Secondary | ICD-10-CM | POA: Insufficient documentation

## 2015-11-04 DIAGNOSIS — Z01810 Encounter for preprocedural cardiovascular examination: Secondary | ICD-10-CM | POA: Insufficient documentation

## 2015-11-04 HISTORY — DX: Unspecified asthma, uncomplicated: J45.909

## 2015-11-04 HISTORY — DX: Respiratory tuberculosis unspecified: A15.9

## 2015-11-04 HISTORY — DX: Inflammatory liver disease, unspecified: K75.9

## 2015-11-04 LAB — CBC
HEMATOCRIT: 46.9 % (ref 39.0–52.0)
HEMOGLOBIN: 15.8 g/dL (ref 13.0–17.0)
MCH: 33.4 pg (ref 26.0–34.0)
MCHC: 33.7 g/dL (ref 30.0–36.0)
MCV: 99.2 fL (ref 78.0–100.0)
Platelets: 295 10*3/uL (ref 150–400)
RBC: 4.73 MIL/uL (ref 4.22–5.81)
RDW: 14.6 % (ref 11.5–15.5)
WBC: 12.8 10*3/uL — ABNORMAL HIGH (ref 4.0–10.5)

## 2015-11-04 LAB — BASIC METABOLIC PANEL
ANION GAP: 8 (ref 5–15)
BUN: 18 mg/dL (ref 6–20)
CALCIUM: 9.7 mg/dL (ref 8.9–10.3)
CO2: 25 mmol/L (ref 22–32)
Chloride: 104 mmol/L (ref 101–111)
Creatinine, Ser: 0.78 mg/dL (ref 0.61–1.24)
GLUCOSE: 83 mg/dL (ref 65–99)
POTASSIUM: 4.1 mmol/L (ref 3.5–5.1)
Sodium: 137 mmol/L (ref 135–145)

## 2015-11-04 NOTE — Telephone Encounter (Signed)
Called pt and patients sister. Reviewed medications and no changes noted.

## 2015-11-09 ENCOUNTER — Ambulatory Visit (HOSPITAL_COMMUNITY): Payer: Self-pay | Admitting: Anesthesiology

## 2015-11-09 ENCOUNTER — Encounter (HOSPITAL_COMMUNITY)
Admission: RE | Disposition: A | Discharge status: Home / Self Care | Payer: Self-pay | Source: Ambulatory Visit | Attending: Gastroenterology

## 2015-11-09 ENCOUNTER — Ambulatory Visit (HOSPITAL_COMMUNITY)
Admission: RE | Admit: 2015-11-09 | Discharge: 2015-11-09 | Disposition: A | Discharge status: Home / Self Care | Payer: Self-pay | Source: Ambulatory Visit | Attending: Gastroenterology | Admitting: Gastroenterology

## 2015-11-09 ENCOUNTER — Encounter (HOSPITAL_COMMUNITY): Payer: Self-pay | Admitting: *Deleted

## 2015-11-09 DIAGNOSIS — F17210 Nicotine dependence, cigarettes, uncomplicated: Secondary | ICD-10-CM | POA: Insufficient documentation

## 2015-11-09 DIAGNOSIS — K648 Other hemorrhoids: Secondary | ICD-10-CM | POA: Insufficient documentation

## 2015-11-09 DIAGNOSIS — D127 Benign neoplasm of rectosigmoid junction: Secondary | ICD-10-CM

## 2015-11-09 DIAGNOSIS — Z1211 Encounter for screening for malignant neoplasm of colon: Secondary | ICD-10-CM

## 2015-11-09 DIAGNOSIS — D128 Benign neoplasm of rectum: Secondary | ICD-10-CM

## 2015-11-09 DIAGNOSIS — D125 Benign neoplasm of sigmoid colon: Secondary | ICD-10-CM

## 2015-11-09 DIAGNOSIS — Z13810 Encounter for screening for upper gastrointestinal disorder: Secondary | ICD-10-CM

## 2015-11-09 DIAGNOSIS — K3189 Other diseases of stomach and duodenum: Secondary | ICD-10-CM

## 2015-11-09 DIAGNOSIS — K319 Disease of stomach and duodenum, unspecified: Secondary | ICD-10-CM | POA: Insufficient documentation

## 2015-11-09 DIAGNOSIS — J45909 Unspecified asthma, uncomplicated: Secondary | ICD-10-CM | POA: Insufficient documentation

## 2015-11-09 DIAGNOSIS — J449 Chronic obstructive pulmonary disease, unspecified: Secondary | ICD-10-CM | POA: Insufficient documentation

## 2015-11-09 HISTORY — PX: POLYPECTOMY: SHX5525

## 2015-11-09 HISTORY — PX: COLONOSCOPY WITH PROPOFOL: SHX5780

## 2015-11-09 HISTORY — PX: ESOPHAGOGASTRODUODENOSCOPY (EGD) WITH PROPOFOL: SHX5813

## 2015-11-09 HISTORY — PX: BIOPSY: SHX5522

## 2015-11-09 LAB — RAPID URINE DRUG SCREEN, HOSP PERFORMED
AMPHETAMINES: NOT DETECTED
Barbiturates: NOT DETECTED
Benzodiazepines: POSITIVE — AB
COCAINE: NOT DETECTED
OPIATES: NOT DETECTED
TETRAHYDROCANNABINOL: POSITIVE — AB

## 2015-11-09 SURGERY — COLONOSCOPY WITH PROPOFOL
Anesthesia: Monitor Anesthesia Care

## 2015-11-09 MED ORDER — PROPOFOL 10 MG/ML IV BOLUS
INTRAVENOUS | Status: AC
  Filled 2015-11-09: qty 20

## 2015-11-09 MED ORDER — LIDOCAINE VISCOUS 2 % MT SOLN
OROMUCOSAL | Status: AC
  Filled 2015-11-09: qty 15

## 2015-11-09 MED ORDER — STERILE WATER FOR IRRIGATION IR SOLN
Status: DC | PRN
  Administered 2015-11-09: 10:00:00

## 2015-11-09 MED ORDER — MIDAZOLAM HCL 2 MG/2ML IJ SOLN
INTRAMUSCULAR | Status: AC
  Filled 2015-11-09: qty 2

## 2015-11-09 MED ORDER — LIDOCAINE HCL (CARDIAC) 10 MG/ML IV SOLN
INTRAVENOUS | Status: DC | PRN
  Administered 2015-11-09: 50 mg via INTRAVENOUS

## 2015-11-09 MED ORDER — LACTATED RINGERS IV SOLN
INTRAVENOUS | Status: DC
  Administered 2015-11-09 (×2): via INTRAVENOUS

## 2015-11-09 MED ORDER — OMEPRAZOLE 20 MG PO CPDR: DELAYED_RELEASE_CAPSULE | ORAL | Status: DC

## 2015-11-09 MED ORDER — LIDOCAINE VISCOUS 2 % MT SOLN
15.0000 mL | Freq: Once | OROMUCOSAL | Status: AC
  Administered 2015-11-09: 6 mL via OROMUCOSAL

## 2015-11-09 MED ORDER — PROPOFOL 500 MG/50ML IV EMUL
INTRAVENOUS | Status: DC | PRN
  Administered 2015-11-09: 11:00:00 via INTRAVENOUS
  Administered 2015-11-09: 150 ug/kg/min via INTRAVENOUS
  Administered 2015-11-09: 11:00:00 via INTRAVENOUS

## 2015-11-09 MED ORDER — FENTANYL CITRATE (PF) 100 MCG/2ML IJ SOLN
INTRAMUSCULAR | Status: AC
  Filled 2015-11-09: qty 2

## 2015-11-09 MED ORDER — MIDAZOLAM HCL 5 MG/5ML IJ SOLN
INTRAMUSCULAR | Status: DC | PRN
  Administered 2015-11-09: 2 mg via INTRAVENOUS

## 2015-11-09 MED ORDER — LIDOCAINE HCL (PF) 1 % IJ SOLN
INTRAMUSCULAR | Status: AC
  Filled 2015-11-09: qty 5

## 2015-11-09 MED ORDER — MIDAZOLAM HCL 2 MG/2ML IJ SOLN
1.0000 mg | INTRAMUSCULAR | Status: DC | PRN
  Administered 2015-11-09: 2 mg via INTRAVENOUS

## 2015-11-09 MED ORDER — FENTANYL CITRATE (PF) 100 MCG/2ML IJ SOLN
25.0000 ug | INTRAMUSCULAR | Status: AC
  Administered 2015-11-09 (×2): 25 ug via INTRAVENOUS

## 2015-11-09 NOTE — Op Note (Signed)
[facility] Patient Name: Derek [name] Procedure Date: 11/09/2015 11:08 AM MRN: [id number] Date of Birth: [birth year] Attending MD: Barney Drain , MD CSN: [id number] Age: 55 Admit Type: Outpatient Procedure:                Upper GI endoscopy Indications:              Screening for Barrett's esophagus, Variceal                            screening (no known varices or prior bleeding) Providers:                Barney Drain, MD, Janeece Riggers, RN, Randa Spike,                            Technician Referring MD:             Charlott Holler Medicines:                Propofol per Anesthesia Complications:            No immediate complications. Estimated Blood Loss:     Estimated blood loss: none. Procedure:                Pre-Anesthesia Assessment:                           - Prior to the procedure, a History and Physical                            was performed, and patient medications and                            allergies were reviewed. The patient's tolerance of                            previous anesthesia was also reviewed. The risks                            and benefits of the procedure and the sedation                            options and risks were discussed with the patient.                            All questions were answered, and informed consent                            was obtained. Prior Anticoagulants: The patient has                            taken no previous anticoagulant or antiplatelet                            agents. ASA Grade Assessment: II - A patient with  mild systemic disease. After reviewing the risks                            and benefits, the patient was deemed in                            satisfactory condition to undergo the procedure.                           After obtaining informed consent, the endoscope was                            passed under direct vision. Throughout the    procedure, the patient's blood pressure, pulse, and                            oxygen saturations were monitored continuously. The                            [id number] scope was introduced through the                            mouth, and advanced to the second part of duodenum.                            The upper GI endoscopy was accomplished without                            difficulty. The patient tolerated the procedure                            well. Scope In: 11:12:22 AM Scope Out: 11:19:20 AM Total Procedure Duration: 0 hours 6 minutes 58 seconds  Findings:      The examined esophagus was normal.      Multiple dispersed, non-bleeding erosions were found in the gastric       antrum. There were no stigmata of recent bleeding. Biopsies were taken       with a cold forceps for Helicobacter pylori testing.      Patchy mildly erythematous mucosa without active bleeding and with no       stigmata of bleeding was found in the duodenal bulb. Impression:               - Normal esophagus.                           - Non-bleeding erosive gastropathy. Biopsied.                           - Erythematous duodenopathy. Moderate Sedation:      Per Anesthesia Care Recommendation:           - Patient has a contact number available for                            emergencies. The signs and symptoms of potential  delayed complications were discussed with the                            patient. Return to normal activities tomorrow.                            Written discharge instructions were provided to the                            patient.                           - High fiber diet and low fat diet.                           - Medication reconciliation was performed, and a                            list of the patient's discharge medications was                            provided to the patient.                           - Await pathology results.                            - No repeat upper endoscopy.                           AVOID ITEMS THAT TRIGGER GASTRITIS.                           START OMEPRAZOLE. TAKE 30 MINUTES PRIOR TO YOUR                            FIRST MEAL.                           FOLLOW UP IN MAY 2017. Procedure Code(s):        --- Professional ---                           [phone], Esophagogastroduodenoscopy, flexible,                            transoral; with biopsy, single or multiple Diagnosis Code(s):        --- Professional ---                           K31.89, Other diseases of stomach and duodenum                           Z13.810, Encounter for screening for upper                            gastrointestinal disorder  CPT copyright 2016 [employer]. All rights reserved. The codes documented in this report are preliminary and upon coder review may  be revised to meet current compliance requirements. Barney Drain, MD Barney Drain, MD 11/09/2015 3:27:36 PM This report has been signed electronically. Number of Addenda: 0

## 2015-11-09 NOTE — Discharge Instructions (Signed)
You have internal hemorrhoids, and HAD 6 Polyps removed. You have EROSIVE GASTRITIS DUE TO NAPROXEN. I biopsied your stomach.    FOLLOW A HIGH FIBER/LOW FAT DIET. AVOID ITEMS THAT CAUSE BLOATING. SEE INFO BELOW.  AVOID ITEMS THAT TRIGGER GASTRITIS. SEE INFO BELOW.  START OMEPRAZOLE.  TAKE 30 MINUTES PRIOR TO YOUR FIRST MEAL.  FOLLOW UP IN MAY 2017.   Next colonoscopy in 5-10 years.   ENDOSCOPY Care After Read the instructions outlined below and refer to this sheet in the next week. These discharge instructions provide you with general information on caring for yourself after you leave the hospital. While your treatment has been planned according to the most current medical practices available, unavoidable complications occasionally occur. If you have any problems or questions after discharge, call DR. Purity Irmen, [phone].  ACTIVITY  You may resume your regular activity, but move at a slower pace for the next 24 hours.   Take frequent rest periods for the next 24 hours.   Walking will help get rid of the air and reduce the bloated feeling in your belly (abdomen).   No driving for 24 hours (because of the medicine (anesthesia) used during the test).   You may shower.   Do not sign any important legal documents or operate any machinery for 24 hours (because of the anesthesia used during the test).    NUTRITION  Drink plenty of fluids.   You may resume your normal diet as instructed by your doctor.   Begin with a light meal and progress to your normal diet. Heavy or fried foods are harder to digest and may make you feel sick to your stomach (nauseated).   Avoid alcoholic beverages for 24 hours or as instructed.    MEDICATIONS  You may resume your normal medications.   WHAT YOU CAN EXPECT TODAY  Some feelings of bloating in the abdomen.   Passage of more gas than usual.   Spotting of blood in your stool or on the toilet paper  .  IF YOU HAD POLYPS REMOVED DURING  THE ENDOSCOPY:  Eat a soft diet IF YOU HAVE NAUSEA, BLOATING, ABDOMINAL PAIN, OR VOMITING.    FINDING OUT THE RESULTS OF YOUR TEST Not all test results are available during your visit. DR. Oneida Alar WILL CALL YOU WITHIN 14 DAYS OF YOUR PROCEDUE WITH YOUR RESULTS. Do not assume everything is normal if you have not heard from DR. Dov Dill , CALL HER OFFICE AT [phone].  SEEK IMMEDIATE MEDICAL ATTENTION AND CALL THE OFFICE: [phone] IF:  You have more than a spotting of blood in your stool.   Your belly is swollen (abdominal distention).   You are nauseated or vomiting.   You have a temperature over 101F.   You have abdominal pain or discomfort that is severe or gets worse throughout the day.  Polyps, Colon  A polyp is extra tissue that grows inside your body. Colon polyps grow in the large intestine. The large intestine, also called the colon, is part of your digestive system. It is a long, hollow tube at the end of your digestive tract where your body makes and stores stool. Most polyps are not dangerous. They are benign. This means they are not cancerous. But over time, some types of polyps can turn into cancer. Polyps that are smaller than a pea are usually not harmful. But larger polyps could someday become or may already be cancerous. To be safe, doctors remove all polyps and test them.   PREVENTION  There is not one sure way to prevent polyps. You might be able to lower your risk of getting them if you:  Eat more fruits and vegetables and less fatty food.   Do not smoke.   Avoid alcohol.   Exercise every day.   Lose weight if you are overweight.   Eating more calcium and folate can also lower your risk of getting polyps. Some foods that are rich in calcium are milk, cheese, and broccoli. Some foods that are rich in folate are chickpeas, kidney beans, and spinach.    Gastritis  Gastritis is an inflammation (the body's way of reacting to injury and/or infection) of the  stomach. It is often caused by viral or bacterial (germ) infections. It can also be caused BY ASPIRIN, BC/GOODY POWDER'S, (IBUPROFEN) MOTRIN, OR ALEVE (NAPROXEN), chemicals (including alcohol), SPICY FOODS, and medications. This illness may be associated with generalized malaise (feeling tired, not well), UPPER ABDOMINAL STOMACH cramps, and fever. One common bacterial cause of gastritis is an organism known as H. Pylori. This can be treated with antibiotics.    High-Fiber Diet A high-fiber diet changes your normal diet to include more whole grains, legumes, fruits, and vegetables. Changes in the diet involve replacing refined carbohydrates with unrefined foods. The calorie level of the diet is essentially unchanged. The Dietary Reference Intake (recommended amount) for adult males is 38 grams per day. For adult females, it is 25 grams per day. Pregnant and lactating women should consume 28 grams of fiber per day. Fiber is the intact part of a plant that is not broken down during digestion. Functional fiber is fiber that has been isolated from the plant to provide a beneficial effect in the body. PURPOSE  Increase stool bulk.   Ease and regulate bowel movements.   Lower cholesterol.  REDUCE RISK OF COLON CANCER  INDICATIONS THAT YOU NEED MORE FIBER  Constipation and hemorrhoids.   Uncomplicated diverticulosis (intestine condition) and irritable bowel syndrome.   Weight management.   As a protective measure against hardening of the arteries (atherosclerosis), diabetes, and cancer.   GUIDELINES FOR INCREASING FIBER IN THE DIET  Start adding fiber to the diet slowly. A gradual increase of about 5 more grams (2 slices of whole-wheat bread, 2 servings of most fruits or vegetables, or 1 bowl of high-fiber cereal) per day is best. Too rapid an increase in fiber may result in constipation, flatulence, and bloating.   Drink enough water and fluids to keep your urine clear or pale yellow. Water,  juice, or caffeine-free drinks are recommended. Not drinking enough fluid may cause constipation.   Eat a variety of high-fiber foods rather than one type of fiber.   Try to increase your intake of fiber through using high-fiber foods rather than fiber pills or supplements that contain small amounts of fiber.   The goal is to change the types of food eaten. Do not supplement your present diet with high-fiber foods, but replace foods in your present diet.   INCLUDE A VARIETY OF FIBER SOURCES  Replace refined and processed grains with whole grains, canned fruits with fresh fruits, and incorporate other fiber sources. White rice, white breads, and most bakery goods contain little or no fiber.   Brown whole-grain rice, buckwheat oats, and many fruits and vegetables are all good sources of fiber. These include: broccoli, Brussels sprouts, cabbage, cauliflower, beets, sweet potatoes, white potatoes (skin on), carrots, tomatoes, eggplant, squash, berries, fresh fruits, and dried fruits.   Cereals appear to  be the richest source of fiber. Cereal fiber is found in whole grains and bran. Bran is the fiber-rich outer coat of cereal grain, which is largely removed in refining. In whole-grain cereals, the bran remains. In breakfast cereals, the largest amount of fiber is found in those with "bran" in their names. The fiber content is sometimes indicated on the label.   You may need to include additional fruits and vegetables each day.   In baking, for 1 cup white flour, you may use the following substitutions:   1 cup whole-wheat flour minus 2 tablespoons.   1/2 cup white flour plus 1/2 cup whole-wheat flour.   Low-Fat Diet BREADS, CEREALS, PASTA, RICE, DRIED PEAS, AND BEANS These products are high in carbohydrates and most are low in fat. Therefore, they can be increased in the diet as substitutes for fatty foods. They too, however, contain calories and should not be eaten in excess. Cereals can be  eaten for snacks as well as for breakfast.  Include foods that contain fiber (fruits, vegetables, whole grains, and legumes). Research shows that fiber may lower blood cholesterol levels, especially the water-soluble fiber found in fruits, vegetables, oat products, and legumes. FRUITS AND VEGETABLES It is good to eat fruits and vegetables. Besides being sources of fiber, both are rich in vitamins and some minerals. They help you get the daily allowances of these nutrients. Fruits and vegetables can be used for snacks and desserts. MEATS Limit lean meat, chicken, [place], and fish to no more than 6 ounces per day. Beef, Pork, and Lamb Use lean cuts of beef, pork, and lamb. Lean cuts include:  Extra-lean ground beef.  Arm roast.  Sirloin tip.  Center-cut ham.  Round steak.  Loin chops.  Rump roast.  Tenderloin.  Trim all fat off the outside of meats before cooking. It is not necessary to severely decrease the intake of red meat, but lean choices should be made. Lean meat is rich in protein and contains a highly absorbable form of iron. Premenopausal women, in particular, should avoid reducing lean red meat because this could increase the risk for low red blood cells (iron-deficiency anemia). The organ meats, such as liver, sweetbreads, kidneys, and brain are very rich in cholesterol. They should be limited. Chicken and [place] These are good sources of protein. The fat of poultry can be reduced by removing the skin and underlying fat layers before cooking. Chicken and [place] can be substituted for lean red meat in the diet. Poultry should not be fried or covered with high-fat sauces. Fish and Shellfish Fish is a good source of protein. Shellfish contain cholesterol, but they usually are low in saturated fatty acids. The preparation of fish is important. Like chicken and [place], they should not be fried or covered with high-fat sauces. EGGS Egg whites contain no fat or cholesterol. They can be  eaten often. Try 1 to 2 egg whites instead of whole eggs in recipes or use egg substitutes that do not contain yolk. MILK AND DAIRY PRODUCTS Use skim or 1% milk instead of 2% or whole milk. Decrease whole milk, natural, and processed cheeses. Use nonfat or low-fat (2%) cottage cheese or low-fat cheeses made from vegetable oils. Choose nonfat or low-fat (1 to 2%) yogurt. Experiment with evaporated skim milk in recipes that call for heavy cream. Substitute low-fat yogurt or low-fat cottage cheese for sour cream in dips and salad dressings. Have at least 2 servings of low-fat dairy products, such as 2 glasses of skim (  or 1%) milk each day to help get your daily calcium intake.  FATS AND OILS Reduce the total intake of fats, especially saturated fat. Butterfat, lard, and beef fats are high in saturated fat and cholesterol. These should be avoided as much as possible. Vegetable fats do not contain cholesterol, but certain vegetable fats, such as coconut oil, palm oil, and palm kernel oil are very high in saturated fats. These should be limited. These fats are often used in bakery goods, processed foods, popcorn, oils, and nondairy creamers. Vegetable shortenings and some peanut butters contain hydrogenated oils, which are also saturated fats. Read the labels on these foods and check for saturated vegetable oils. Unsaturated vegetable oils and fats do not raise blood cholesterol. However, they should be limited because they are fats and are high in calories. Total fat should still be limited to 30% of your daily caloric intake. Desirable liquid vegetable oils are corn oil, cottonseed oil, olive oil, canola oil, safflower oil, soybean oil, and sunflower oil. Peanut oil is not as good, but small amounts are acceptable. Buy a heart-healthy tub margarine that has no partially hydrogenated oils in the ingredients. Mayonnaise and salad dressings often are made from unsaturated fats, but they should also be limited because  of their high calorie and fat content. Seeds, nuts, peanut butter, olives, and avocados are high in fat, but the fat is mainly the unsaturated type. These foods should be limited mainly to avoid excess calories and fat. OTHER EATING TIPS Snacks  Most sweets should be limited as snacks. They tend to be rich in calories and fats, and their caloric content outweighs their nutritional value. Some good choices in snacks are graham crackers, melba toast, soda crackers, bagels (no egg), English muffins, fruits, and vegetables. These snacks are preferable to snack crackers, [place] fries, and chips. Popcorn should be air-popped or cooked in small amounts of liquid vegetable oil. Desserts Eat fruit, low-fat yogurt, and fruit ices. AVOID pastries, cake, and cookies. Sherbet, angel food cake, gelatin dessert, frozen low-fat yogurt, or other frozen products that do not contain saturated fat (pure fruit juice bars, frozen ice pops) are also acceptable.  COOKING METHODS Choose those methods that use little or no fat. They include: Poaching.  Braising.  Steaming.  Grilling.  Baking.  Stir-frying.  Broiling.  Microwaving.  Foods can be cooked in a nonstick pan without added fat, or use a nonfat cooking spray in regular cookware. Limit fried foods and avoid frying in saturated fat. Add moisture to lean meats by using water, broth, cooking wines, and other nonfat or low-fat sauces along with the cooking methods mentioned above. Soups and stews should be chilled after cooking. The fat that forms on top after a few hours in the refrigerator should be skimmed off. When preparing meals, avoid using excess salt. Salt can contribute to raising blood pressure in some people. EATING AWAY FROM HOME Order entres, potatoes, and vegetables without sauces or butter. When meat exceeds the size of a deck of cards (3 to 4 ounces), the rest can be taken home for another meal. Choose vegetable or fruit salads and ask for  low-calorie salad dressings to be served on the side. Use dressings sparingly. Limit high-fat toppings, such as bacon, crumbled eggs, cheese, sunflower seeds, and olives. Ask for heart-healthy tub margarine instead of butter.   Hemorrhoids Hemorrhoids are dilated (enlarged) veins around the rectum. Sometimes clots will form in the veins. This makes them swollen and painful. These are called thrombosed  hemorrhoids. Causes of hemorrhoids include:  Constipation.   Straining to have a bowel movement.   HEAVY LIFTING  HOME CARE INSTRUCTIONS  Eat a well balanced diet and drink 6 to 8 glasses of water every day to avoid constipation. You may also use a bulk laxative.   Avoid straining to have bowel movements.   Keep anal area dry and clean.   Do not use a donut shaped pillow or sit on the toilet for long periods. This increases blood pooling and pain.   Move your bowels when your body has the urge; this will require less straining and will decrease pain and pressure.

## 2015-11-09 NOTE — Telephone Encounter (Signed)
REVIEWED-NO ADDITIONAL RECOMMENDATIONS. 

## 2015-11-09 NOTE — Transfer of Care (Signed)
Immediate Anesthesia Transfer of Care Note  Patient: Derek [name]  Procedure(s) Performed: Procedure(s) with comments: COLONOSCOPY WITH PROPOFOL (N/A) - 1030 - pt unable to come earlier due to transportation  ESOPHAGOGASTRODUODENOSCOPY (EGD) WITH PROPOFOL (N/A) - 1030  BIOPSY - sigmoid colon polyps x3 POLYPECTOMY - rectal polyps x3( one not retrieved  Patient Location: PACU  Anesthesia Type:MAC  Level of Consciousness: awake, alert , oriented and patient cooperative  Airway & Oxygen Therapy: Patient Spontanous Breathing and Patient connected to nasal cannula oxygen  Post-op Assessment: Report given to RN and Post -op Vital signs reviewed and stable  Post vital signs: Reviewed and stable  Last Vitals:  Filed Vitals:   11/09/15 1005 11/09/15 1010  BP: 127/84 126/79  Temp:    Resp: 20 17    Complications: No apparent anesthesia complications

## 2015-11-09 NOTE — Anesthesia Preprocedure Evaluation (Signed)
Anesthesia Evaluation  Patient identified by MRN, date of birth, ID band Patient awake    Reviewed: Allergy & Precautions, NPO status , Patient's Chart, lab work & pertinent test results  Airway Mallampati: II  TM Distance: >3 FB     Dental  (+) Teeth Intact   Pulmonary asthma , COPD, Current Smoker,    breath sounds clear to auscultation       Cardiovascular negative cardio ROS   Rhythm:Regular Rate:Normal     Neuro/Psych    GI/Hepatic (+)     substance abuse  alcohol use, Hepatitis -, C  Endo/Other    Renal/GU      Musculoskeletal   Abdominal   Peds  Hematology   Anesthesia Other Findings   Reproductive/Obstetrics                             Anesthesia Physical Anesthesia Plan  ASA: III  Anesthesia Plan: MAC   Post-op Pain Management:    Induction: Intravenous  Airway Management Planned: Simple Face Mask  Additional Equipment:   Intra-op Plan:   Post-operative Plan:   Informed Consent: I have reviewed the patients History and Physical, chart, labs and discussed the procedure including the risks, benefits and alternatives for the proposed anesthesia with the patient or authorized representative who has indicated his/her understanding and acceptance.     Plan Discussed with:   Anesthesia Plan Comments:         Anesthesia Quick Evaluation

## 2015-11-09 NOTE — H&P (Signed)
  Primary Care Physician:  Soyla Dryer, PA-C Primary Gastroenterologist:  Dr. Oneida Alar  Pre-Procedure History & Physical: HPI:  Derek [name] is a 55 y.o. male here for SCREENING FOR COLON CA AND VARICES.  Past Medical History  Diagnosis Date  . COPD (chronic obstructive pulmonary disease) ([place])   . Substance abuse   . Asthma   . Tuberculosis   . Hepatitis     Past Surgical History  Procedure Laterality Date  . Hernia repair Left   . Eye surgery Right     Prior to Admission medications   Medication Sig Start Date End Date Taking? Authorizing Provider  albuterol (PROVENTIL) (2.5 MG/3ML) 0.083% nebulizer solution Take 3 mLs (2.5 mg total) by nebulization every 6 (six) hours as needed for wheezing or shortness of breath. 08/03/15  Yes Soyla Dryer, PA-C  Cyanocobalamin (VITAMIN B-12 PO) Take 1 tablet by mouth daily.   Yes Historical Provider, MD  mometasone-formoterol (DULERA) 200-5 MCG/ACT AERO Inhale 2 puffs into the lungs 2 (two) times daily. 08/03/15  Yes Soyla Dryer, PA-C  naproxen sodium (ANAPROX) 220 MG tablet Take 220 mg by mouth daily as needed (pain).   Yes Historical Provider, MD    Allergies as of 10/07/2015  . (No Known Allergies)    Family History  Problem Relation Age of Onset  . Cancer Mother     lymph nodes  . Ulcers Father   . Cholecystitis Father   . Colon cancer Neg Hx   . Colon polyps Neg Hx     Social History   Social History  . Marital Status: Married    Spouse Name: N/A  . Number of Children: N/A  . Years of Education: N/A   Occupational History  . Not on file.   Social History Main Topics  . Smoking status: Current Every Day Smoker -- 2.00 packs/day for 40 years    Types: Cigarettes  . Smokeless tobacco: Never Used  . Alcohol Use: No     Comment: hx alcoholism. sober since 04-2015  . Drug Use: Yes    Special: Marijuana, Cocaine     Comment: Remote cocaine use, Current marjiuana use  . Sexual Activity: Not on file   Other  Topics Concern  . Not on file   Social History Narrative   SON LIVES IN [place], [place] IN [place] AND WORKING IN [employer]. BORN AND RAISED IN [employer]. 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     Review of Systems: See HPI, otherwise negative ROS   Physical Exam: BP 126/79 mmHg  Temp(Src) 97.8 F (36.6 C) (Oral)  Resp 17  SpO2 96% General:   Alert,  pleasant and cooperative in NAD Head:  Normocephalic and atraumatic. Neck:  Supple; Lungs:  Clear throughout to auscultation.    Heart:  Regular rate and rhythm. Abdomen:  Soft, nontender and nondistended. Normal bowel sounds, without guarding, and without rebound.   Neurologic:  Alert and  oriented x4;  grossly normal neurologically.  Impression/Plan:     SCREENING FOR COLON CA AND VARICES  PLAN:  1.EGD/TCS TODAY

## 2015-11-09 NOTE — Op Note (Signed)
[facility] Patient Name: Derek [name] Procedure Date: 11/09/2015 10:37 AM MRN: [id number] Date of Birth: 1961/05/05 Attending MD: Barney Drain , MD CSN: [id number] Age: 55 Admit Type: Outpatient Procedure:                Colonoscopy Indications:              Screening for colorectal malignant neoplasm Providers:                Barney Drain, MD, Janeece Riggers, RN, Randa Spike,                            Technician Referring MD:             Charlott Holler Medicines:                Propofol per Anesthesia Complications:            No immediate complications. Estimated Blood Loss:     Estimated blood loss: none. Procedure:                Pre-Anesthesia Assessment:                           - Prior to the procedure, a History and Physical                            was performed, and patient medications and                            allergies were reviewed. The patient's tolerance of                            previous anesthesia was also reviewed. The risks                            and benefits of the procedure and the sedation                            options and risks were discussed with the patient.                            All questions were answered, and informed consent                            was obtained. Prior Anticoagulants: The patient has                            taken naproxen, last dose was day of procedure. ASA                            Grade Assessment: II - A patient with mild systemic                            disease. After reviewing the risks and benefits,  the patient was deemed in satisfactory condition to                            undergo the procedure.                           - Prior to the procedure, a History and Physical                            was performed, and patient medications and                            allergies were reviewed. The patient's tolerance of                            previous  anesthesia was also reviewed. The risks                            and benefits of the procedure and the sedation                            options and risks were discussed with the patient.                            All questions were answered, and informed consent                            was obtained. Prior Anticoagulants: The patient has                            taken naproxen, last dose was day of procedure. ASA                            Grade Assessment: II - A patient with mild systemic                            disease. After reviewing the risks and benefits,                            the patient was deemed in satisfactory condition to                            undergo the procedure.                           After obtaining informed consent, the colonoscope                            was passed under direct vision. Throughout the                            procedure, the patient's blood pressure, pulse, and  oxygen saturations were monitored continuously. The                            [id number] scope was introduced through                            the anus and advanced to the the cecum, identified                            by appendiceal orifice and ileocecal valve. The                            colonoscopy was performed without difficulty. The                            patient tolerated the procedure well. The quality                            of the bowel preparation was good. The ileocecal                            valve, appendiceal orifice, and rectum were                            photographed. Scope In: 10:36:44 AM Scope Out: 11:05:11 AM Scope Withdrawal Time: 0 hours 23 minutes 30 seconds  Total Procedure Duration: 0 hours 28 minutes 27 seconds  Findings:      Five sessile polyps were found in the recto-sigmoid colon and sigmoid       colon. The polyps were 3 to 5 mm in size. These polyps were removed with       a  cold biopsy forceps. Resection and retrieval were complete.      A 6 mm polyp was found in the rectum. The polyp was sessile. The polyp       was removed with a hot snare. Resection and retrieval were complete.      Internal hemorrhoids were found. The hemorrhoids were small.      The digital rectal exam findings include non-thrombosed external       hemorrhoids. [pertinent negatives]. Impression:               - Five 3 to 5 mm polyps at the recto-sigmoid colon                            and in the sigmoid colon, removed with a cold                            biopsy forceps. Resected and retrieved.                           - One 6 mm polyp in the rectum, removed with a hot                            snare. Resected and retrieved.                           -  Internal hemorrhoids. Moderate Sedation:      Per Anesthesia Care Recommendation:           - Patient has a contact number available for                            emergencies. The signs and symptoms of potential                            delayed complications were discussed with the                            patient. Return to normal activities tomorrow.                            Written discharge instructions were provided to the                            patient.                           - High fiber diet and low fat diet.                           - Medication reconciliation was performed, and a                            list of the patient's discharge medications was                            provided to the patient.                           - Await pathology results.                           - Repeat colonoscopy in 5-10 years for surveillance.                           - Return to GI clinic in 2 months. Procedure Code(s):        --- Professional ---                           [phone], Colonoscopy, flexible; with removal of                            tumor(s), polyp(s), or other lesion(s) by snare                             technique                           [id number], 59, Colonoscopy, flexible; with biopsy,                            single or multiple Diagnosis Code(s):        ---  Professional ---                           Z12.11, Encounter for screening for malignant                            neoplasm of colon                           D12.7, Benign neoplasm of rectosigmoid junction                           D12.5, Benign neoplasm of sigmoid colon                           K62.1, Rectal polyp                           K64.8, Other hemorrhoids CPT copyright 2016 American Medical Association. All rights reserved. The codes documented in this report are preliminary and upon coder review may  be revised to meet current compliance requirements. Barney Drain, MD Barney Drain, MD 11/09/2015 3:33:35 PM This report has been signed electronically. Number of Addenda: 0

## 2015-11-09 NOTE — Anesthesia Procedure Notes (Signed)
Procedure Name: MAC Date/Time: 11/09/2015 10:22 AM Performed by: Andree Elk, AMY A Pre-anesthesia Checklist: Patient identified, Emergency Drugs available, Suction available, Patient being monitored and Timeout performed Oxygen Delivery Method: Simple face mask

## 2015-11-09 NOTE — Anesthesia Postprocedure Evaluation (Signed)
Anesthesia Post Note  Patient: Derek [name]  Procedure(s) Performed: Procedure(s) (LRB): COLONOSCOPY WITH PROPOFOL (N/A) ESOPHAGOGASTRODUODENOSCOPY (EGD) WITH PROPOFOL (N/A) BIOPSY POLYPECTOMY  Patient location during evaluation: PACU Anesthesia Type: MAC Level of consciousness: awake and alert and oriented Pain management: pain level controlled Vital Signs Assessment: post-procedure vital signs reviewed and stable Respiratory status: spontaneous breathing and patient connected to nasal cannula oxygen Cardiovascular status: stable Postop Assessment: no signs of nausea or vomiting Anesthetic complications: no    Last Vitals:  Filed Vitals:   11/09/15 1005 11/09/15 1010  BP: 127/84 126/79  Temp:    Resp: 20 17    Last Pain: There were no vitals filed for this visit.               ADAMS, AMY A

## 2015-11-15 ENCOUNTER — Encounter (HOSPITAL_COMMUNITY): Payer: Self-pay | Admitting: Gastroenterology

## 2015-12-01 ENCOUNTER — Encounter: Payer: Self-pay | Admitting: Physician Assistant

## 2015-12-01 ENCOUNTER — Ambulatory Visit: Payer: Self-pay | Admitting: Physician Assistant

## 2015-12-01 VITALS — BP 126/74 | HR 73 | Temp 98.1°F | Ht 70.5 in | Wt 164.1 lb

## 2015-12-01 DIAGNOSIS — K219 Gastro-esophageal reflux disease without esophagitis: Secondary | ICD-10-CM

## 2015-12-01 DIAGNOSIS — F17218 Nicotine dependence, cigarettes, with other nicotine-induced disorders: Secondary | ICD-10-CM

## 2015-12-01 DIAGNOSIS — J449 Chronic obstructive pulmonary disease, unspecified: Secondary | ICD-10-CM

## 2015-12-01 DIAGNOSIS — B182 Chronic viral hepatitis C: Secondary | ICD-10-CM

## 2015-12-01 NOTE — Progress Notes (Signed)
BP 126/74 mmHg  Pulse 73  Temp(Src) 98.1 F (36.7 C)  Ht 5' 10.5" (1.791 m)  Wt 164 lb 1.6 oz (74.435 kg)  BMI 23.21 kg/m2  SpO2 97%   Subjective:    Patient ID: Derek [name], male    DOB: 05-03-1961, 55 y.o.   MRN: [id number]  HPI: Derek [name] is a 55 y.o. male presenting on 12/01/2015 for COPD   HPI Pt feeling much better on dulera.  He is still smoking.    He is not drinking  He has seen GI for hepatitis  Relevant past medical, surgical, family and social history reviewed and updated as indicated. Interim medical history since our last visit reviewed. Allergies and medications reviewed and updated.  Current outpatient prescriptions:  .  albuterol (PROVENTIL) (2.5 MG/3ML) 0.083% nebulizer solution, Take 3 mLs (2.5 mg total) by nebulization every 6 (six) hours as needed for wheezing or shortness of breath., Disp: 150 mL, Rfl: 1 .  Cyanocobalamin (VITAMIN B-12 PO), Take 1 tablet by mouth daily., Disp: , Rfl:  .  mometasone-formoterol (DULERA) 200-5 MCG/ACT AERO, Inhale 2 puffs into the lungs 2 (two) times daily., Disp: 3 Inhaler, Rfl: 1 .  omeprazole (PRILOSEC) 20 MG capsule, 1 po every morning 30 minutes prior to your first meal., Disp: 31 capsule, Rfl: 11  Review of Systems  Constitutional: Negative for fever, chills, diaphoresis, appetite change, fatigue and unexpected weight change.  HENT: Negative for congestion, dental problem, drooling, ear pain, facial swelling, hearing loss, mouth sores, sneezing, sore throat, trouble swallowing and voice change.   Eyes: Negative for pain, discharge, redness, itching and visual disturbance.  Respiratory: Positive for cough, shortness of breath and wheezing. Negative for choking.   Cardiovascular: Negative for chest pain, palpitations and leg swelling.  Gastrointestinal: Negative for vomiting, abdominal pain, diarrhea, constipation and blood in stool.  Endocrine: Positive for cold intolerance and heat intolerance. Negative for  polydipsia.  Genitourinary: Negative for dysuria, hematuria and decreased urine volume.  Musculoskeletal: Negative for back pain, arthralgias and gait problem.  Skin: Positive for rash.  Allergic/Immunologic: Negative for environmental allergies.  Neurological: Negative for seizures, syncope, light-headedness and headaches.  Hematological: Negative for adenopathy.  Psychiatric/Behavioral: Negative for suicidal ideas, dysphoric mood and agitation. The patient is not nervous/anxious.     Per HPI unless specifically indicated above     Objective:    BP 126/74 mmHg  Pulse 73  Temp(Src) 98.1 F (36.7 C)  Ht 5' 10.5" (1.791 m)  Wt 164 lb 1.6 oz (74.435 kg)  BMI 23.21 kg/m2  SpO2 97%  Wt Readings from Last 3 Encounters:  12/01/15 164 lb 1.6 oz (74.435 kg)  11/04/15 167 lb 6 oz (75.921 kg)  10/07/15 167 lb 9.6 oz (76.023 kg)    Physical Exam  Constitutional: He is oriented to person, place, and time. He appears well-developed and well-nourished.  HENT:  Head: Normocephalic and atraumatic.  Neck: Neck supple.  Cardiovascular: Normal rate and regular rhythm.   Pulmonary/Chest: Effort normal and breath sounds normal. He has no wheezes.  Abdominal: Soft. Bowel sounds are normal. There is no hepatosplenomegaly. There is no tenderness.  Musculoskeletal: He exhibits no edema.  Lymphadenopathy:    He has no cervical adenopathy.  Neurological: He is alert and oriented to person, place, and time.  Skin: Skin is warm and dry.  Psychiatric: He has a normal mood and affect. His behavior is normal.  Vitals reviewed.       Assessment & Plan:  Encounter Diagnoses  Name Primary?  . Chronic obstructive pulmonary disease, unspecified COPD type ([place]) Yes  . Cigarette nicotine dependence with other nicotine-induced disorder   . Gastroesophageal reflux disease, esophagitis presence not specified   . Chronic hepatitis C without hepatic coma ([facility])     -Pt counseled to call medassist for  refills of his dulera -continue current medications -continue with GI for hepatitis -counseled on smoking cessation -f/u 4 months.  RTO sooner prn

## 2015-12-05 DIAGNOSIS — K219 Gastro-esophageal reflux disease without esophagitis: Secondary | ICD-10-CM | POA: Insufficient documentation

## 2015-12-07 ENCOUNTER — Encounter (HOSPITAL_COMMUNITY): Payer: Self-pay | Admitting: Gastroenterology

## 2015-12-12 ENCOUNTER — Telehealth: Payer: Self-pay | Admitting: Gastroenterology

## 2015-12-12 NOTE — Telephone Encounter (Signed)
Please call pt. He had ONE simple adenoma AND FIVE HYPERPLASTIC POLYPS removed. His stomach Bx shows gastritis.    FOLLOW A HIGH FIBER/LOW FAT DIET. AVOID ITEMS THAT CAUSE BLOATING.  AVOID ITEMS THAT TRIGGER GASTRITIS.  CONTINUE OMEPRAZOLE.  TAKE 30 MINUTES PRIOR TO YOUR FIRST MEAL.  FOLLOW UP IN MAY 2017 E30 CIRRHOSIS/GERD.   Next colonoscopy in 5-10 years.

## 2015-12-13 NOTE — Telephone Encounter (Signed)
LMOM to call.

## 2015-12-13 NOTE — Telephone Encounter (Signed)
ON RECALL  °

## 2015-12-15 NOTE — Telephone Encounter (Signed)
Letter mailed for pt to call for results.  

## 2016-01-10 ENCOUNTER — Ambulatory Visit (INDEPENDENT_AMBULATORY_CARE_PROVIDER_SITE_OTHER): Payer: Self-pay | Admitting: Gastroenterology

## 2016-01-10 ENCOUNTER — Other Ambulatory Visit: Payer: Self-pay

## 2016-01-10 ENCOUNTER — Other Ambulatory Visit: Payer: Self-pay | Admitting: Gastroenterology

## 2016-01-10 ENCOUNTER — Encounter: Payer: Self-pay | Admitting: Gastroenterology

## 2016-01-10 VITALS — BP 131/83 | HR 76 | Temp 97.4°F | Ht 72.0 in | Wt 167.2 lb

## 2016-01-10 DIAGNOSIS — B182 Chronic viral hepatitis C: Secondary | ICD-10-CM

## 2016-01-10 DIAGNOSIS — K2970 Gastritis, unspecified, without bleeding: Secondary | ICD-10-CM | POA: Insufficient documentation

## 2016-01-10 DIAGNOSIS — K2990 Gastroduodenitis, unspecified, without bleeding: Secondary | ICD-10-CM

## 2016-01-10 NOTE — Assessment & Plan Note (Signed)
Clinically doing well. Rarely uses omeprazole. Avoiding alcohol.

## 2016-01-10 NOTE — Patient Instructions (Signed)
1. Once your labs and ultrasound are done and results reviewed, we will proceed with Harvoni approval. 2. You need to stay away from alcohol FOREVER. 3. Once we start treatment for hepatitis C, you will need to let us know any time that you decide to add or change any medications whether they are prescription or over-the-counter medications. Many medications can interfere with hepatitis C treatment drug. 4. It will be crucial that you take your hepatitis C medication at the same time every day, not forgetting any doses.  5. Please start reading over the [facility] patient information provided today. Call with any questions.

## 2016-01-10 NOTE — Assessment & Plan Note (Signed)
Patient is interested in pursuing treatment of his hepatitis C. Genotype unknown. No evidence of cirrhosis on ultrasound. He needs Elastography for fibrosis testing. We discussed potential hepatitis C treatment. He continues to need to avoid alcohol forever. We discussed potential of reinfection of hepatitis C if he pursues risky behavior. He is aware that he needs to be compliant with all of his medications. He has to inform us if there are any new medications being prescribed or any that he decided to utilize from over-the-counter prior to starting them because it can interfere with hepatitis C treatment medications. Once we obtain pending labs, fibrosis testing we will pursue patient assistance for hepatitis C treatment as appropriate.

## 2016-01-10 NOTE — Progress Notes (Signed)
CC'ED TO PCP 

## 2016-01-10 NOTE — Progress Notes (Signed)
      Primary Care Physician: Soyla Dryer, PA-C  Primary Gastroenterologist:  Barney Drain, MD   Chief Complaint  Patient presents with  . Follow-up    HPI: Derek [name] is a 55 y.o. male here for follow up of chronic HCV, GERD. Recent EGD/TCS for screening for Barrett's, varices, colon cancer. Patient had several rectal polyps removed, simple adenomas. Next colonoscopy recommended 5-10 years. On endoscopy he had nonbleeding erosive gastropathy, erythematous duodenopathy. Stomach biopsy benign.  Patient is interested in pursuing hepatitis C treatment. He has seen the abdomen TV for Harvoni. He quit drinking alcohol in September 2016. He states that he had small relapse about 1 month ago but has been clean since then. Clinically he is feeling well. He is using omeprazole as needed only. Started after his endoscopy back in March. Denies heartburn, abdominal pain, constipation, diarrhea, melena, rectal bleeding. Tries to stay away from medication as much as possible.  Current Outpatient Prescriptions  Medication Sig Dispense Refill  . albuterol (PROVENTIL) (2.5 MG/3ML) 0.083% nebulizer solution Take 3 mLs (2.5 mg total) by nebulization every 6 (six) hours as needed for wheezing or shortness of breath. 150 mL 1  . Cyanocobalamin (VITAMIN B-12 PO) Take 1 tablet by mouth daily.    . mometasone-formoterol (DULERA) 200-5 MCG/ACT AERO Inhale 2 puffs into the lungs 2 (two) times daily. 3 Inhaler 1  . omeprazole (PRILOSEC) 20 MG capsule 1 po every morning 30 minutes prior to your first meal. 31 capsule 11  . naltrexone (DEPADE) 50 MG tablet Take 50 mg by mouth daily.  2   No current facility-administered medications for this visit.    Allergies as of 01/10/2016  . (No Known Allergies)    ROS:  General: Negative for anorexia, weight loss, fever, chills, fatigue, weakness. ENT: Negative for hoarseness, difficulty swallowing , nasal congestion. CV: Negative for chest pain, angina,  palpitations, dyspnea on exertion, peripheral edema.  Respiratory: Negative for dyspnea at rest, dyspnea on exertion, cough, sputum, wheezing.  GI: See history of present illness. GU:  Negative for dysuria, hematuria, urinary incontinence, urinary frequency, nocturnal urination.  Endo: Negative for unusual weight change.    Physical Examination:   BP 131/83 mmHg  Pulse 76  Temp(Src) 97.4 F (36.3 C) (Oral)  Ht 6' (1.829 m)  Wt 167 lb 3.2 oz (75.841 kg)  BMI 22.67 kg/m2  General: Well-nourished, well-developed in no acute distress.  Eyes: No icterus. Mouth: Oropharyngeal mucosa moist and pink , no lesions erythema or exudate. Lungs: Clear to auscultation bilaterally.  Heart: Regular rate and rhythm, no murmurs rubs or gallops.  Abdomen: Bowel sounds are normal, nontender, nondistended, no hepatosplenomegaly or masses, no abdominal bruits or hernia , no rebound or guarding.   Extremities: No lower extremity edema. No clubbing or deformities. Neuro: Alert and oriented x 4   Skin: Warm and dry, no jaundice.   Psych: Alert and cooperative, normal mood and affect.  Labs:  Lab Results  Component Value Date   WBC 12.8* 11/04/2015   HGB 15.8 11/04/2015   HCT 46.9 11/04/2015   MCV 99.2 11/04/2015   PLT 295 11/04/2015   Lab Results  Component Value Date   CREATININE 0.78 11/04/2015   BUN 18 11/04/2015   NA 137 11/04/2015   K 4.1 11/04/2015   CL 104 11/04/2015   CO2 25 11/04/2015    Imaging Studies: No results found.

## 2016-01-11 ENCOUNTER — Other Ambulatory Visit: Payer: Self-pay | Admitting: Physician Assistant

## 2016-01-11 DIAGNOSIS — B182 Chronic viral hepatitis C: Secondary | ICD-10-CM

## 2016-01-11 DIAGNOSIS — K2990 Gastroduodenitis, unspecified, without bleeding: Secondary | ICD-10-CM

## 2016-01-11 DIAGNOSIS — K2970 Gastritis, unspecified, without bleeding: Secondary | ICD-10-CM

## 2016-01-13 ENCOUNTER — Ambulatory Visit (HOSPITAL_COMMUNITY)
Admission: RE | Admit: 2016-01-13 | Discharge: 2016-01-13 | Disposition: A | Discharge status: Home / Self Care | Payer: Self-pay | Source: Ambulatory Visit | Attending: Gastroenterology | Admitting: Gastroenterology

## 2016-01-13 DIAGNOSIS — K2990 Gastroduodenitis, unspecified, without bleeding: Secondary | ICD-10-CM | POA: Insufficient documentation

## 2016-01-13 DIAGNOSIS — B182 Chronic viral hepatitis C: Secondary | ICD-10-CM | POA: Insufficient documentation

## 2016-01-13 DIAGNOSIS — K2970 Gastritis, unspecified, without bleeding: Secondary | ICD-10-CM | POA: Insufficient documentation

## 2016-01-14 NOTE — Progress Notes (Signed)
Quick Note:  Liver looks normal, F3/F4 Metavir fibrosis score however. Did he eat prior to u/s or was he fasting. Await pending labs. ______

## 2016-01-20 NOTE — Progress Notes (Signed)
Quick Note:  LMOM for a return call. ______ 

## 2016-01-21 NOTE — Progress Notes (Signed)
Quick Note:  Tried to call and could not leave a message, VM is full. Mailing a letter for pt to call. ______

## 2016-02-03 ENCOUNTER — Telehealth: Payer: Self-pay | Admitting: Gastroenterology

## 2016-02-03 NOTE — Telephone Encounter (Signed)
Patient still needs to have his labs done.  Please also verify whether or not patient ever received any treatment previously for HCV.

## 2016-02-10 NOTE — Telephone Encounter (Signed)
LMOM to call.

## 2016-02-14 NOTE — Telephone Encounter (Signed)
Tried to call and mail box is full. Mailing a letter for pt to call.

## 2016-02-21 ENCOUNTER — Other Ambulatory Visit: Payer: Self-pay | Admitting: Physician Assistant

## 2016-02-21 LAB — HEPATIC FUNCTION PANEL
ALK PHOS: 80 U/L (ref 40–115)
ALT: 82 U/L — AB (ref 9–46)
AST: 72 U/L — ABNORMAL HIGH (ref 10–35)
Albumin: 3.7 g/dL (ref 3.6–5.1)
BILIRUBIN DIRECT: 0.1 mg/dL (ref <=0.2)
BILIRUBIN INDIRECT: 0.3 mg/dL (ref 0.2–1.2)
BILIRUBIN TOTAL: 0.4 mg/dL (ref 0.2–1.2)
TOTAL PROTEIN: 7.7 g/dL (ref 6.1–8.1)

## 2016-02-21 LAB — PROTIME-INR
INR: 1
Prothrombin Time: 10.7 s (ref 9.0–11.5)

## 2016-02-22 LAB — HEPATITIS A ANTIBODY, TOTAL: HEP A TOTAL AB: NONREACTIVE

## 2016-02-22 LAB — HEPATITIS B SURFACE ANTIGEN: Hepatitis B Surface Ag: NEGATIVE

## 2016-02-22 LAB — HEPATITIS B CORE ANTIBODY, TOTAL: Hep B Core Total Ab: NONREACTIVE

## 2016-02-24 LAB — HEPATITIS C GENOTYPE

## 2016-03-03 NOTE — Progress Notes (Signed)
Quick Note:  Hep B surface antibody was not done.  LFTs stable.  HCV genotype 1a.   It has been difficult to get patient to communicate with [place] and delay in care since he has not had labs in timely fashion. This may be evidence of ongoing etoh use and/or noncompliance risk.  Let's see if we can get patient to return for OV. If he is still candidate for HCV treatment (ie not abusing etoh) then he will need Hep B surf ANTIBODY and fibrosure blood testing. ______

## 2016-03-06 NOTE — Progress Notes (Signed)
Quick Note:  LMOM to call. ______ 

## 2016-03-07 NOTE — Progress Notes (Signed)
Quick Note:  Letter mailed to pt that it is VERY IMPORTANT to call and schedule an OV. ______

## 2016-03-30 ENCOUNTER — Ambulatory Visit: Payer: Self-pay | Admitting: Physician Assistant

## 2016-04-05 ENCOUNTER — Encounter: Payer: Self-pay | Admitting: Gastroenterology

## 2016-04-10 ENCOUNTER — Encounter: Payer: Self-pay | Admitting: Physician Assistant

## 2016-04-20 ENCOUNTER — Ambulatory Visit: Payer: Self-pay | Admitting: Gastroenterology

## 2016-04-27 ENCOUNTER — Ambulatory Visit (INDEPENDENT_AMBULATORY_CARE_PROVIDER_SITE_OTHER): Payer: Self-pay | Admitting: Gastroenterology

## 2016-04-27 ENCOUNTER — Encounter: Payer: Self-pay | Admitting: Gastroenterology

## 2016-04-27 VITALS — BP 138/82 | HR 72 | Temp 98.1°F | Ht 72.0 in | Wt 174.0 lb

## 2016-04-27 DIAGNOSIS — B182 Chronic viral hepatitis C: Secondary | ICD-10-CM

## 2016-04-27 NOTE — Progress Notes (Signed)
Referring Provider: Soyla Dryer, PA-C Primary Care Physician:  Soyla Dryer, PA-C  Primary GI: Dr. Oneida Alar   Chief Complaint  Patient presents with  . Follow-up    HPI:   Derek [name] is a 55 y.o. male presenting today with a history of chronic Hep C genotype 1a GERD. Next colonoscopy due in 5-10 years. EGD up-to-date. Quit drinking ETOH in Sept 2016. Hep B surface antigen negative and Hep B core total antibody negative. Metavir score F3/F4.   No relapses. No prior treatment for Hep C. Some occasional bloating otherwise no issues. No constipation or diarrhea. Prilosec 20 mg once daily. No OTC agents. Will need patient assistance.   Past Medical History:  Diagnosis Date  . Asthma   . COPD (chronic obstructive pulmonary disease) ([place])   . Hepatitis   . Substance abuse   . Tuberculosis     Past Surgical History:  Procedure Laterality Date  . BIOPSY  11/09/2015   Procedure: BIOPSY;  Surgeon: Danie Binder, MD;  Location: [facility];  Service: Endoscopy;;  sigmoid colon polyps x3  . COLONOSCOPY WITH PROPOFOL N/A 11/09/2015   SLF:5 POLYPSREMOVED FROM RECTO-SIGMOID COLON/INTERNAL HEMORRHOIDS  . ESOPHAGOGASTRODUODENOSCOPY (EGD) WITH PROPOFOL N/A 11/09/2015   [id number]  . EYE SURGERY Right   . HERNIA REPAIR Left   . POLYPECTOMY  11/09/2015   Procedure: POLYPECTOMY;  Surgeon: Danie Binder, MD;  Location: [facility];  Service: Endoscopy;;  rectal polyps x3( one not retrieved    Current Outpatient Prescriptions  Medication Sig Dispense Refill  . albuterol (PROVENTIL) (2.5 MG/3ML) 0.083% nebulizer solution Take 3 mLs (2.5 mg total) by nebulization every 6 (six) hours as needed for wheezing or shortness of breath. 150 mL 1  . Cyanocobalamin (VITAMIN B-12 PO) Take 1 tablet by mouth daily.    . mometasone-formoterol (DULERA) 200-5 MCG/ACT AERO Inhale 2 puffs into the lungs 2 (two) times daily. 3 Inhaler 1  . naltrexone (DEPADE) 50 MG tablet Take 50 mg by mouth daily.   2  . omeprazole (PRILOSEC) 20 MG capsule 1 po every morning 30 minutes prior to your first meal. 31 capsule 11   No current facility-administered medications for this visit.     Allergies as of 04/27/2016  . (No Known Allergies)    Family History  Problem Relation Age of Onset  . Cancer Mother     lymph nodes  . Ulcers Father   . Cholecystitis Father   . Colon cancer Neg Hx   . Colon polyps Neg Hx     Social History   Social History  . Marital status: Divorced    Spouse name: N/A  . Number of children: N/A  . Years of education: N/A   Social History Main Topics  . Smoking status: Current Every Day Smoker    Packs/day: 2.00    Years: 40.00    Types: Cigarettes  . Smokeless tobacco: Never Used  . Alcohol use No     Comment: hx alcoholism. sober since 04-2015  . Drug use:     Types: Marijuana, Cocaine     Comment: Remote cocaine use, Current marjiuana use  . Sexual activity: Not Asked   Other Topics Concern  . None   Social History Narrative   SON LIVES IN [place], [place] LIVES IN [place] AND WORKING IN [employer]. BORN AND RAISED IN [employer]. 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     Review of  Systems: As mentioned in HPI   Physical Exam: BP 138/82   Pulse 72   Temp 98.1 F (36.7 C) (Oral)   Ht 6' (1.829 m)   Wt 174 lb (78.9 kg)   BMI 23.60 kg/m  General:   Alert and oriented. No distress noted. Pleasant and cooperative.  Head:  Normocephalic and atraumatic. Eyes:  Conjuctiva clear without scleral icterus. Heart:  S1, S2 present without murmurs, rubs, or gallops. Regular rate and rhythm. Abdomen:  +BS, soft, non-tender and non-distended. No rebound or guarding. No HSM or masses noted. Msk:  Symmetrical without gross deformities. Normal posture. Extremities:  Without edema. Psych:  Alert and cooperative. Normal mood and affect.  Lab Results  Component Value Date   ALT 82 (H)  02/21/2016   AST 72 (H) 02/21/2016   ALKPHOS 80 02/21/2016   BILITOT 0.4 02/21/2016   Lab Results  Component Value Date   CREATININE 0.78 11/04/2015   BUN 18 11/04/2015   NA 137 11/04/2015   K 4.1 11/04/2015   CL 104 11/04/2015   CO2 25 11/04/2015   Lab Results  Component Value Date   WBC 12.8 (H) 11/04/2015   HGB 15.8 11/04/2015   HCT 46.9 11/04/2015   MCV 99.2 11/04/2015   PLT 295 11/04/2015   Lab Results  Component Value Date   INR 1.0 02/21/2016

## 2016-04-27 NOTE — Patient Instructions (Signed)
We are submitting for Harvoni approval.  Continue alcohol avoidance.  I am proud of you!

## 2016-05-05 NOTE — Assessment & Plan Note (Signed)
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 No prior treatment. Continues to avoid ETOH. He will need to obtain financial assistance. He is ready to pursue treatment. Will submit for Harvoni for 12 weeks once he has completed the paperwork. As of note, discussed at length compliance, avoidance of OTC medications, and not taking a PPI if he is able to stay off of a PPI during therapy. However, if he has to take a PPI, we will proceed with taking at the same time of day with Harvoni. Will follow closely during treatment and see at 4 weeks into treatment and then again at end of treatment. Will submit once he completes patient assistance forms.

## 2016-05-08 NOTE — Progress Notes (Signed)
CC'ED TO PCP 

## 2016-05-09 ENCOUNTER — Other Ambulatory Visit: Payer: Self-pay | Admitting: Physician Assistant

## 2016-05-15 ENCOUNTER — Ambulatory Visit: Payer: Self-pay | Admitting: Physician Assistant

## 2016-05-15 ENCOUNTER — Encounter: Payer: Self-pay | Admitting: Physician Assistant

## 2016-05-15 VITALS — BP 120/72 | HR 87 | Temp 98.1°F | Ht 72.0 in | Wt 173.0 lb

## 2016-05-15 DIAGNOSIS — F17218 Nicotine dependence, cigarettes, with other nicotine-induced disorders: Secondary | ICD-10-CM

## 2016-05-15 DIAGNOSIS — J441 Chronic obstructive pulmonary disease with (acute) exacerbation: Secondary | ICD-10-CM

## 2016-05-15 DIAGNOSIS — R062 Wheezing: Secondary | ICD-10-CM

## 2016-05-15 MED ORDER — ALBUTEROL SULFATE (2.5 MG/3ML) 0.083% IN NEBU
2.5000 mg | INHALATION_SOLUTION | Freq: Once | RESPIRATORY_TRACT | Status: AC
  Administered 2016-05-15: 2.5 mg via RESPIRATORY_TRACT

## 2016-05-15 MED ORDER — PREDNISONE 10 MG PO TABS: ORAL_TABLET | ORAL | 0 refills | Status: AC

## 2016-05-15 MED ORDER — ALBUTEROL SULFATE (2.5 MG/3ML) 0.083% IN NEBU: 2.5000 mg | INHALATION_SOLUTION | Freq: Four times a day (QID) | RESPIRATORY_TRACT | 1 refills | Status: DC | PRN

## 2016-05-15 NOTE — Progress Notes (Signed)
BP 120/72 (BP Location: Left Arm, Patient Position: Sitting, Cuff Size: Normal)   Pulse 87   Temp 98.1 F (36.7 C)   Ht 6' (1.829 m)   Wt 173 lb (78.5 kg)   SpO2 93%   BMI 23.46 kg/m    Subjective:    Patient ID: Derek [name], male    DOB: 1960-12-18, 55 y.o.   MRN: [id number]  HPI: KWAME CASTALDO is a 55 y.o. male presenting on 05/15/2016 for Follow-up   HPI   Pt sick for "3 or 4 weeks".  He was a no-show for his last appointment  Pt still smokiing  Pt seen by GI- awaiting treatment with harvoni.  Relevant past medical, surgical, family and social history reviewed and updated as indicated. Interim medical history since our last visit reviewed. Allergies and medications reviewed and updated.   Current Outpatient Prescriptions:  .  albuterol (PROVENTIL) (2.5 MG/3ML) 0.083% nebulizer solution, Take 3 mLs (2.5 mg total) by nebulization every 6 (six) hours as needed for wheezing or shortness of breath., Disp: 150 mL, Rfl: 1 .  Cyanocobalamin (VITAMIN B-12 PO), Take 1 tablet by mouth daily., Disp: , Rfl:  .  mometasone-formoterol (DULERA) 200-5 MCG/ACT AERO, Inhale 2 puffs into the lungs 2 (two) times daily. (Patient not taking: Reported on 05/15/2016), Disp: 3 Inhaler, Rfl: 1 .  naltrexone (DEPADE) 50 MG tablet, Take 50 mg by mouth daily., Disp: , Rfl: 2 .  omeprazole (PRILOSEC) 20 MG capsule, 1 po every morning 30 minutes prior to your first meal. (Patient not taking: Reported on 05/15/2016), Disp: 31 capsule, Rfl: 11   Review of Systems  Constitutional: Positive for chills, diaphoresis and fatigue. Negative for appetite change, fever and unexpected weight change.  HENT: Positive for congestion and sneezing. Negative for dental problem, drooling, ear pain, facial swelling, hearing loss, mouth sores, sore throat, trouble swallowing and voice change.   Eyes: Negative for pain, discharge, redness, itching and visual disturbance.  Respiratory: Positive for cough, shortness of  breath and wheezing. Negative for choking.   Cardiovascular: Negative for chest pain, palpitations and leg swelling.  Gastrointestinal: Negative for abdominal pain, blood in stool, constipation, diarrhea and vomiting.  Endocrine: Positive for polydipsia. Negative for cold intolerance and heat intolerance.  Genitourinary: Negative for decreased urine volume, dysuria and hematuria.  Musculoskeletal: Negative for arthralgias, back pain and gait problem.  Skin: Negative for rash.  Allergic/Immunologic: Negative for environmental allergies.  Neurological: Positive for headaches. Negative for seizures, syncope and light-headedness.  Hematological: Negative for adenopathy.  Psychiatric/Behavioral: Negative for agitation, dysphoric mood and suicidal ideas. The patient is not nervous/anxious.     Per HPI unless specifically indicated above     Objective:    BP 120/72 (BP Location: Left Arm, Patient Position: Sitting, Cuff Size: Normal)   Pulse 87   Temp 98.1 F (36.7 C)   Ht 6' (1.829 m)   Wt 173 lb (78.5 kg)   SpO2 93%   BMI 23.46 kg/m   Wt Readings from Last 3 Encounters:  05/15/16 173 lb (78.5 kg)  04/27/16 174 lb (78.9 kg)  01/10/16 167 lb 3.2 oz (75.8 kg)    Physical Exam  Constitutional: He is oriented to person, place, and time. He appears well-developed and well-nourished.  HENT:  Head: Normocephalic and atraumatic.  Neck: Neck supple.  Cardiovascular: Normal rate and regular rhythm.   Pulmonary/Chest: Effort normal. No respiratory distress. He has wheezes. He has no rales.  Abdominal: Soft. Bowel sounds are  normal. There is no hepatosplenomegaly. There is no tenderness.  Musculoskeletal: He exhibits no edema.  Lymphadenopathy:    He has no cervical adenopathy.  Neurological: He is alert and oriented to person, place, and time.  Skin: Skin is warm and dry.  Psychiatric: He has a normal mood and affect. His behavior is normal.  Vitals reviewed.  Pt breathing improved  after nebulizer treatment in office.       Assessment & Plan:   Encounter Diagnoses  Name Primary?  Marland Kitchen COPD exacerbation ([place]) Yes  . Wheezing   . Cigarette nicotine dependence with other nicotine-induced disorder     -rx Nebulizers. Urged pt to avoid smoking. rx Prednisone taper and counseled pt on proper way to take it.  He says he has been on this medication in the past and is familiar with it. Get back on dulera (he says it's in the mail) -F/u 3 months.  RTO sooner prn

## 2016-06-03 IMAGING — DX DG CHEST 2V
2 series · 2 of 2 positions shown · non-contrast
Comparison: 10/30/2009.

CLINICAL DATA: Asthma.  Cough.

EXAM:
CHEST  2 VIEW

[chest pa]
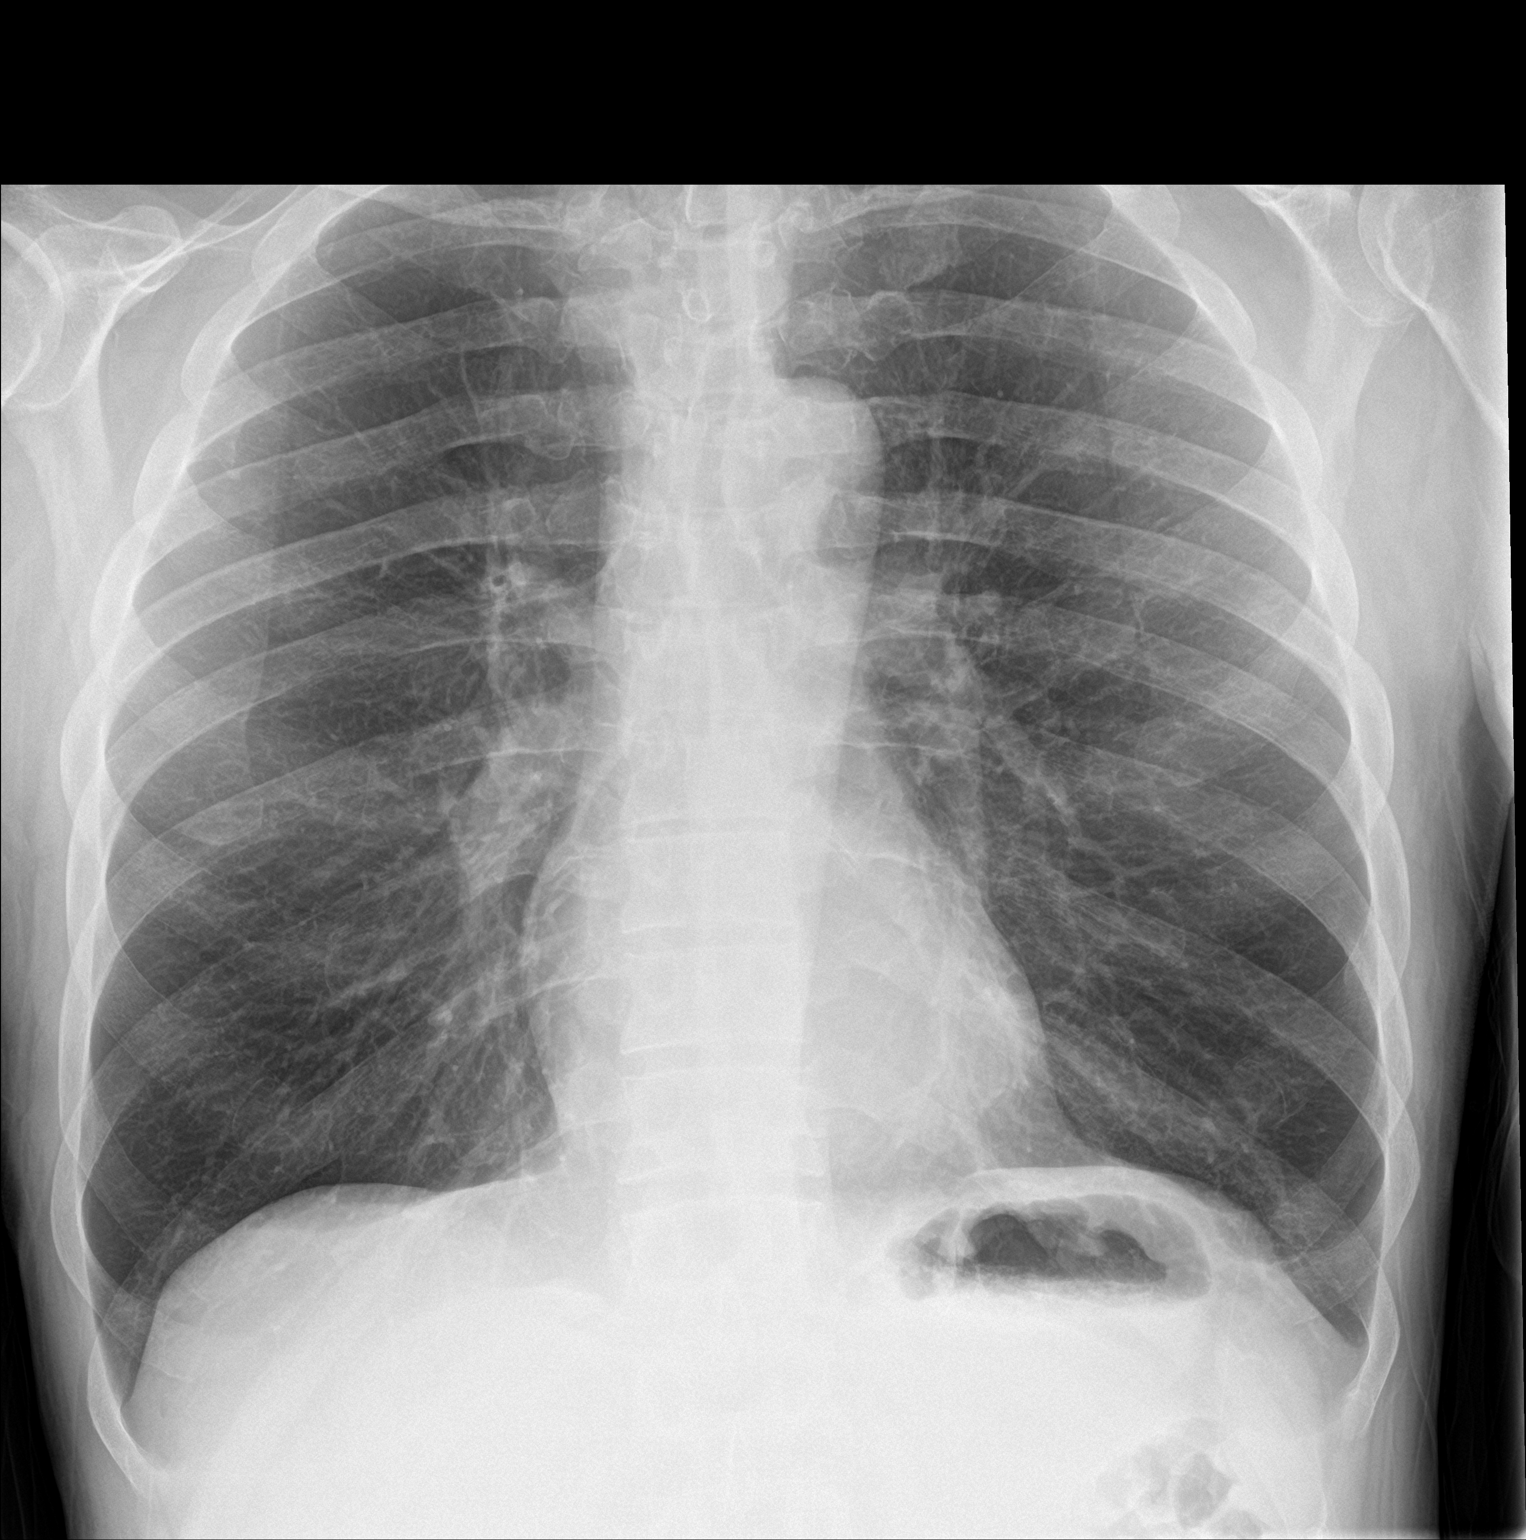

[chest lat]
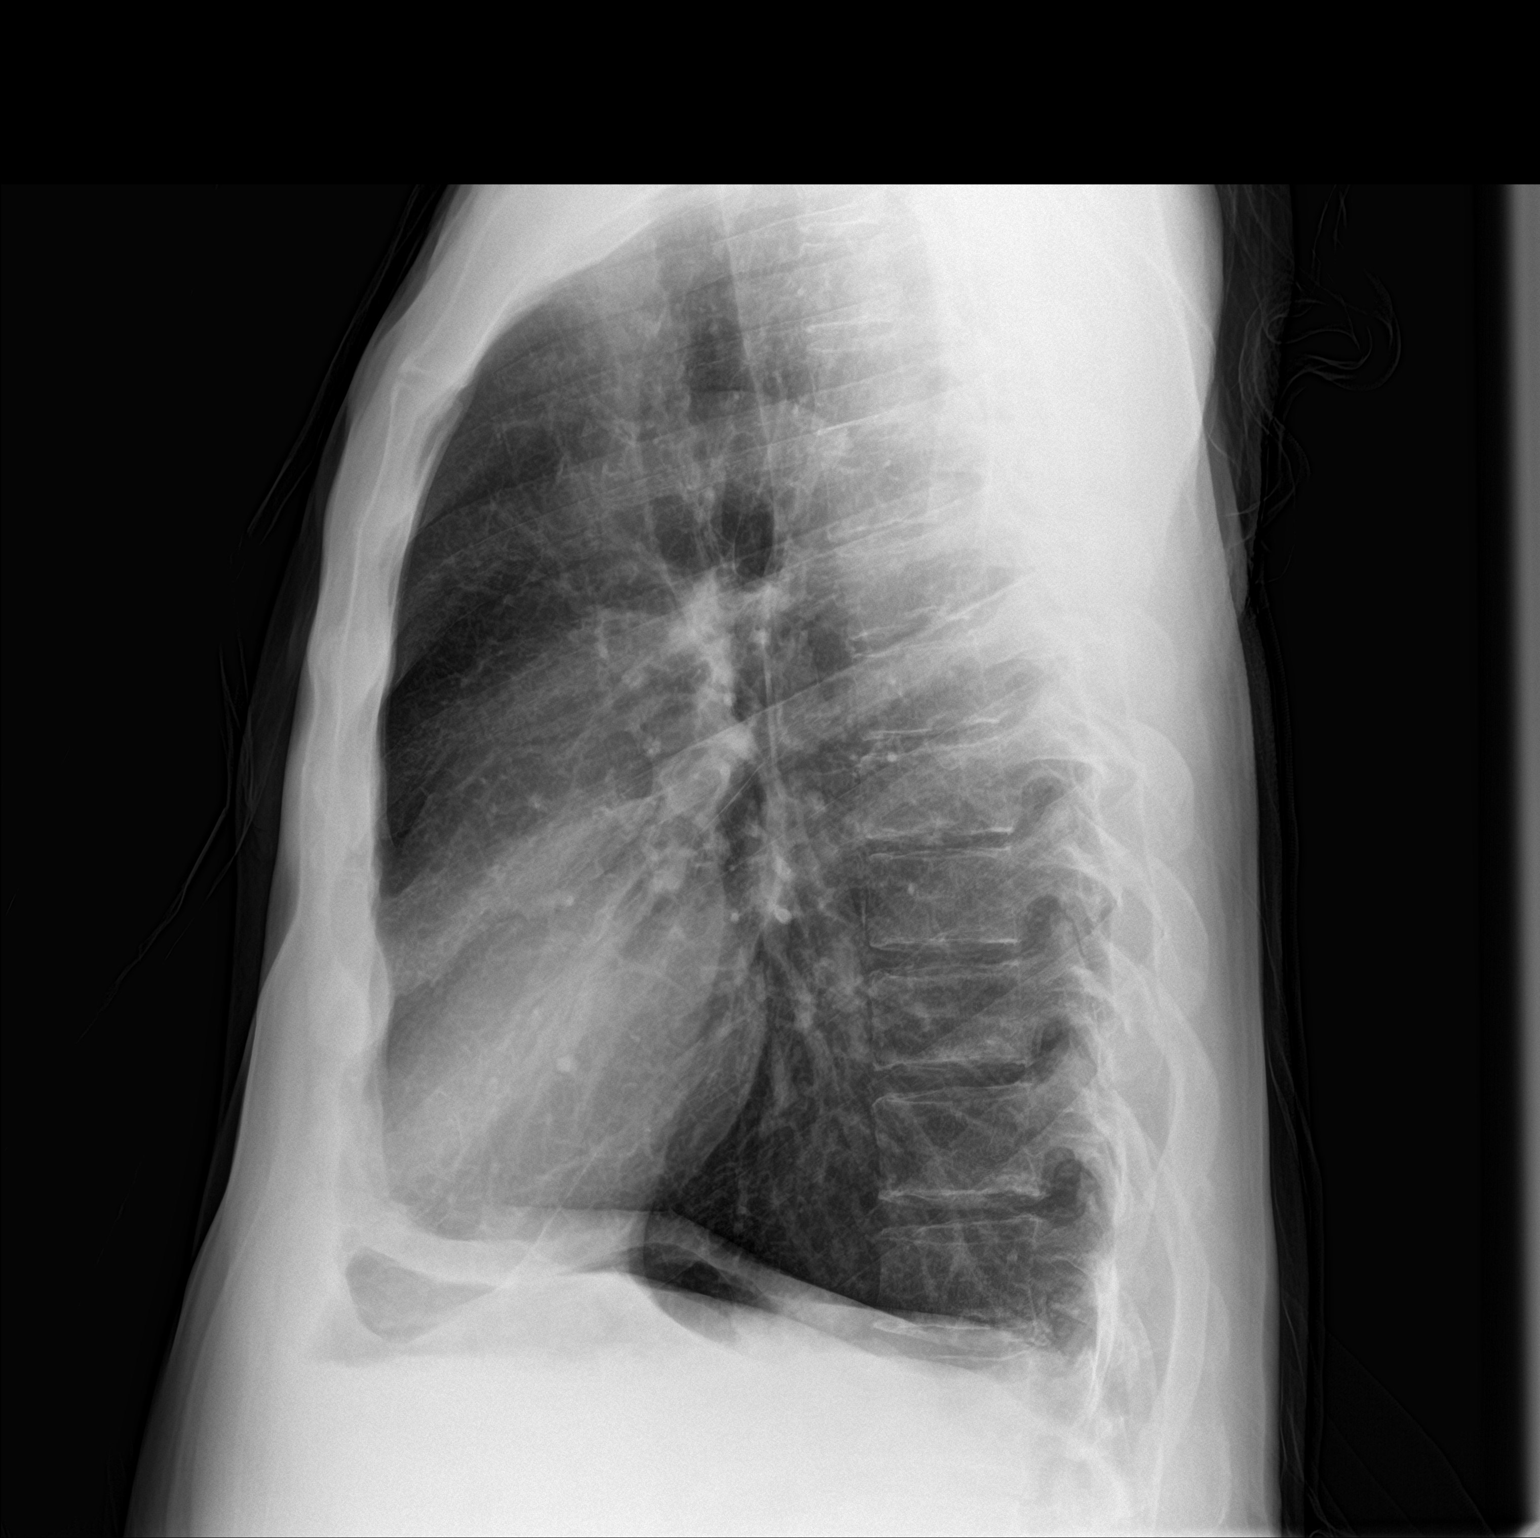

[2 of 2 positions shown; findings below may reference images not displayed]

FINDINGS: Mediastinum and hilar structures normal. No focal infiltrate.
Chronic interstitial prominence. Heart size normal. No pleural
effusion or pneumothorax . No acute bony abnormality.
IMPRESSION: No acute cardiopulmonary disease. Mild interstitial prominence again
noted suggesting chronic interstitial lung disease.

## 2016-07-13 ENCOUNTER — Telehealth: Payer: Self-pay

## 2016-07-13 ENCOUNTER — Ambulatory Visit: Payer: Self-pay | Admitting: Physician Assistant

## 2016-07-13 NOTE — Telephone Encounter (Signed)
Patients Harvoni has arrived. What instructions do you want me to give him?

## 2016-07-17 NOTE — Telephone Encounter (Signed)
He is on Prilosec. If he absolutely has to stay on this, then do the following:  1. Take Prilosec AT THE SAME TIME as Harvoni on an empty stomach.  2. Take Harvoni at the same time every day. Do not take any OTC medications without calling us first.  3. Return 4 weeks after starting Harvoni for labs.

## 2016-07-18 NOTE — Telephone Encounter (Signed)
Instructions completed. Tried to call pt- NA and voicemail was full.

## 2016-07-24 ENCOUNTER — Encounter: Payer: Self-pay | Admitting: Physician Assistant

## 2016-07-24 NOTE — Telephone Encounter (Signed)
Tried to call pt- NA-voicemail was full and couldn't leave a message.

## 2016-07-25 NOTE — Telephone Encounter (Signed)
Letter mailed to the pt to come by the office and pick up his medication.

## 2016-08-24 ENCOUNTER — Encounter: Payer: Self-pay | Admitting: Physician Assistant

## 2016-08-24 ENCOUNTER — Ambulatory Visit: Payer: Self-pay | Admitting: Physician Assistant

## 2016-08-24 VITALS — BP 160/90 | HR 75 | Temp 98.1°F | Ht 72.0 in | Wt 176.8 lb

## 2016-08-24 DIAGNOSIS — B1920 Unspecified viral hepatitis C without hepatic coma: Secondary | ICD-10-CM

## 2016-08-24 DIAGNOSIS — I159 Secondary hypertension, unspecified: Secondary | ICD-10-CM

## 2016-08-24 DIAGNOSIS — J449 Chronic obstructive pulmonary disease, unspecified: Secondary | ICD-10-CM

## 2016-08-24 DIAGNOSIS — F17218 Nicotine dependence, cigarettes, with other nicotine-induced disorders: Secondary | ICD-10-CM

## 2016-08-24 MED ORDER — ALBUTEROL SULFATE HFA 108 (90 BASE) MCG/ACT IN AERS: 2.0000 | INHALATION_SPRAY | Freq: Four times a day (QID) | RESPIRATORY_TRACT | 1 refills | Status: DC | PRN

## 2016-08-24 MED ORDER — LISINOPRIL 10 MG PO TABS: 10.0000 mg | ORAL_TABLET | Freq: Every day | ORAL | 1 refills | Status: DC

## 2016-08-24 MED ORDER — MOMETASONE FURO-FORMOTEROL FUM 200-5 MCG/ACT IN AERO: 2.0000 | INHALATION_SPRAY | Freq: Two times a day (BID) | RESPIRATORY_TRACT | 1 refills | Status: DC

## 2016-08-24 NOTE — Progress Notes (Signed)
BP (!) 160/90 (BP Location: Left Arm, Patient Position: Sitting, Cuff Size: Normal)   Pulse 75   Temp 98.1 F (36.7 C)   Ht 6' (1.829 m)   Wt 176 lb 12 oz (80.2 kg)   SpO2 92%   BMI 23.97 kg/m    Subjective:    Patient ID: Derek [name], male    DOB: [birth year], 55 y.o.   MRN: [id number]  HPI: Derek [name] is a 55 y.o. male presenting on 08/24/2016 for Follow-up   HPI   Pt still smoking  He is on harvoni.  He has no scheduled f/u with GI  Pt gets naltrexone from some place where he isn't going any longer.  He doesn't remember prescriber's name.  Relevant past medical, surgical, family and social history reviewed and updated as indicated. Interim medical history since our last visit reviewed. Allergies and medications reviewed and updated.   Current Outpatient Prescriptions:  .  albuterol (PROVENTIL) (2.5 MG/3ML) 0.083% nebulizer solution, Take 3 mLs (2.5 mg total) by nebulization every 6 (six) hours as needed for wheezing or shortness of breath., Disp: 150 mL, Rfl: 1 .  Cyanocobalamin (VITAMIN B-12 PO), Take 1 tablet by mouth daily., Disp: , Rfl:  .  Ledipasvir-Sofosbuvir (HARVONI PO), Take 1 tablet by mouth daily., Disp: , Rfl:  .  naltrexone (DEPADE) 50 MG tablet, Take 50 mg by mouth daily., Disp: , Rfl: 2 .  albuterol (PROVENTIL) (2.5 MG/3ML) 0.083% nebulizer solution, Take 3 mLs (2.5 mg total) by nebulization every 6 (six) hours as needed for wheezing or shortness of breath. (Patient not taking: Reported on 08/24/2016), Disp: 150 mL, Rfl: 1 .  mometasone-formoterol (DULERA) 200-5 MCG/ACT AERO, Inhale 2 puffs into the lungs 2 (two) times daily. (Patient not taking: Reported on 08/24/2016), Disp: 3 Inhaler, Rfl: 1 .  omeprazole (PRILOSEC) 20 MG capsule, 1 po every morning 30 minutes prior to your first meal. (Patient not taking: Reported on 08/24/2016), Disp: 31 capsule, Rfl: 11   Review of Systems  Constitutional: Negative for appetite change, chills, diaphoresis,  fatigue, fever and unexpected weight change.  HENT: Positive for congestion. Negative for dental problem, drooling, ear pain, facial swelling, hearing loss, mouth sores, sneezing, sore throat, trouble swallowing and voice change.   Eyes: Negative for pain, discharge, redness, itching and visual disturbance.  Respiratory: Positive for cough, shortness of breath and wheezing. Negative for choking.   Cardiovascular: Negative for chest pain, palpitations and leg swelling.  Gastrointestinal: Negative for abdominal pain, blood in stool, constipation, diarrhea and vomiting.  Endocrine: Negative for cold intolerance, heat intolerance and polydipsia.  Genitourinary: Negative for decreased urine volume, dysuria and hematuria.  Musculoskeletal: Negative for arthralgias, back pain and gait problem.  Skin: Negative for rash.  Allergic/Immunologic: Negative for environmental allergies.  Neurological: Negative for seizures, syncope, light-headedness and headaches.  Hematological: Negative for adenopathy.  Psychiatric/Behavioral: Negative for agitation, dysphoric mood and suicidal ideas. The patient is not nervous/anxious.     Per HPI unless specifically indicated above     Objective:    BP (!) 160/90 (BP Location: Left Arm, Patient Position: Sitting, Cuff Size: Normal)   Pulse 75   Temp 98.1 F (36.7 C)   Ht 6' (1.829 m)   Wt 176 lb 12 oz (80.2 kg)   SpO2 92%   BMI 23.97 kg/m   Wt Readings from Last 3 Encounters:  08/24/16 176 lb 12 oz (80.2 kg)  05/15/16 173 lb (78.5 kg)  04/27/16 174 lb (78.9 kg)  Physical Exam  Constitutional: He is oriented to person, place, and time. He appears well-developed and well-nourished.  HENT:  Head: Normocephalic and atraumatic.  Neck: Neck supple.  Cardiovascular: Normal rate and regular rhythm.   Pulmonary/Chest: No respiratory distress. He has wheezes. He has no rhonchi. He has no rales.  occassional expiratory wheeze  Abdominal: Soft. Bowel sounds are  normal. There is no hepatosplenomegaly. There is no tenderness.  Musculoskeletal: He exhibits no edema.  Lymphadenopathy:    He has no cervical adenopathy.  Neurological: He is alert and oriented to person, place, and time.  Skin: Skin is warm and dry.  Psychiatric: He has a normal mood and affect. His behavior is normal.  Vitals reviewed.       Assessment & Plan:   Encounter Diagnoses  Name Primary?  . Chronic obstructive pulmonary disease, unspecified COPD type ([place]) Yes  . Secondary hypertension   . Cigarette nicotine dependence with other nicotine-induced disorder   . Hepatitis C virus infection without hepatic coma, unspecified chronicity      -rx lisinopril for bp -Refilled inhalers -Counseled pt on smoking cessation -continue with GI for hepatitis treatment -follow up one month to recheck BP.  RTO sooner prn

## 2016-08-24 NOTE — Progress Notes (Signed)
follo

## 2016-08-31 NOTE — Telephone Encounter (Signed)
Pt picked up his medication on 08/03/16. He needed an office visit in 4 weeks of starting medication. His next bottle of harvoni is here. Please schedule pt ov asap and let him know. I tried to call him but got no answer, no voicemail.

## 2016-09-01 NOTE — Telephone Encounter (Signed)
APPT MADE AND PATIENT AWARE, WILL COME IN TODAY AND PICK UP HIS HARVONI

## 2016-09-05 ENCOUNTER — Encounter: Payer: Self-pay | Admitting: Gastroenterology

## 2016-09-05 ENCOUNTER — Ambulatory Visit (INDEPENDENT_AMBULATORY_CARE_PROVIDER_SITE_OTHER): Payer: Self-pay | Admitting: Gastroenterology

## 2016-09-05 VITALS — BP 139/88 | HR 84 | Temp 98.1°F | Ht 72.0 in | Wt 177.0 lb

## 2016-09-05 DIAGNOSIS — B182 Chronic viral hepatitis C: Secondary | ICD-10-CM

## 2016-09-05 NOTE — Progress Notes (Signed)
Referring Provider: Soyla Dryer, PA-C Primary Care Physician:  Soyla Dryer, PA-C Primary GI: Dr. Oneida Alar   Chief Complaint  Patient presents with  . Hepatitis C    HPI:   Derek [name] is a 56 y.o. male presenting today with a history of chronic Hep C genotype 1a, Metavir score F3/F4. EGD-up-to-date. Next colonoscopy due in 5-10 years. Started Harvoni on 08/03/16. Here for 4 week follow-up. Treatment in process with Harvoni. Due for US abdomen as well.   Not taking Prilosec often. No abdominal pain, N/V. No confusion, mental status changes. Doing well.     Past Medical History:  Diagnosis Date  . Asthma   . COPD (chronic obstructive pulmonary disease) ([place])   . Hepatitis   . Substance abuse   . Tuberculosis     Past Surgical History:  Procedure Laterality Date  . BIOPSY  11/09/2015   Procedure: BIOPSY;  Surgeon: Danie Binder, MD;  Location: [facility];  Service: Endoscopy;;  sigmoid colon polyps x3  . COLONOSCOPY WITH PROPOFOL N/A 11/09/2015   SLF:5 POLYPSREMOVED FROM RECTO-SIGMOID COLON/INTERNAL HEMORRHOIDS  . ESOPHAGOGASTRODUODENOSCOPY (EGD) WITH PROPOFOL N/A 11/09/2015   [id number]  . EYE SURGERY Right   . HERNIA REPAIR Left   . POLYPECTOMY  11/09/2015   Procedure: POLYPECTOMY;  Surgeon: Danie Binder, MD;  Location: [facility];  Service: Endoscopy;;  rectal polyps x3( one not retrieved    Current Outpatient Prescriptions  Medication Sig Dispense Refill  . albuterol (PROVENTIL HFA;VENTOLIN HFA) 108 (90 Base) MCG/ACT inhaler Inhale 2 puffs into the lungs every 6 (six) hours as needed for wheezing or shortness of breath. 3 Inhaler 1  . albuterol (PROVENTIL) (2.5 MG/3ML) 0.083% nebulizer solution Take 3 mLs (2.5 mg total) by nebulization every 6 (six) hours as needed for wheezing or shortness of breath. 150 mL 1  . Cyanocobalamin (VITAMIN B-12 PO) Take 1 tablet by mouth daily.    . Ledipasvir-Sofosbuvir (HARVONI PO) Take 1 tablet by mouth daily.    Marland Kitchen  lisinopril (PRINIVIL,ZESTRIL) 10 MG tablet Take 1 tablet (10 mg total) by mouth daily. 30 tablet 1  . mometasone-formoterol (DULERA) 200-5 MCG/ACT AERO Inhale 2 puffs into the lungs 2 (two) times daily. 3 Inhaler 1  . naltrexone (DEPADE) 50 MG tablet Take 50 mg by mouth daily.  2  . omeprazole (PRILOSEC) 20 MG capsule 1 po every morning 30 minutes prior to your first meal. 31 capsule 11  . albuterol (PROVENTIL) (2.5 MG/3ML) 0.083% nebulizer solution Take 3 mLs (2.5 mg total) by nebulization every 6 (six) hours as needed for wheezing or shortness of breath. (Patient not taking: Reported on 09/05/2016) 150 mL 1   No current facility-administered medications for this visit.     Allergies as of 09/05/2016  . (No Known Allergies)    Family History  Problem Relation Age of Onset  . Cancer Mother     lymph nodes  . Ulcers Father   . Cholecystitis Father   . Colon cancer Neg Hx   . Colon polyps Neg Hx     Social History   Social History  . Marital status: Divorced    Spouse name: N/A  . Number of children: N/A  . Years of education: N/A   Social History Main Topics  . Smoking status: Current Every Day Smoker    Packs/day: 2.00    Years: 40.00    Types: Cigarettes  . Smokeless tobacco: Never Used  . Alcohol use No  Comment: hx alcoholism. sober since 04-2015  . Drug use:     Types: Marijuana, Cocaine     Comment: Remote cocaine use, Current marjiuana use  . Sexual activity: Not Asked   Other Topics Concern  . None   Social History Narrative   SON LIVES IN [place], [place] LIVES IN [place] AND WORKING IN [employer]. BORN AND RAISED IN [employer] [place]. 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     Review of Systems: As mentioned in HPI   Physical Exam: BP 139/88   Pulse 84   Temp 98.1 F (36.7 C) (Oral)   Ht 6' (1.829 m)   Wt 177 lb (80.3 kg)   BMI 24.01 kg/m  General:   Alert and oriented. No distress  noted. Pleasant and cooperative.  Head:  Normocephalic and atraumatic. Eyes:  Conjuctiva clear without scleral icterus. Mouth:  Oral mucosa pink and moist. Good dentition. No lesions. Abdomen:  +BS, soft, non-tender and non-distended. No rebound or guarding. No HSM or masses noted. Msk:  Symmetrical without gross deformities. Normal posture. Extremities:  Without edema. Neurologic:  Alert and  oriented x4;  grossly normal neurologically. Psych:  Alert and cooperative. Normal mood and affect.

## 2016-09-05 NOTE — Patient Instructions (Signed)
Continue Harvoni. IF you need Prilosec, make sure you are taking it at the same time as Harvoni. If you can do without it, that's even better.   Please have blood work done. We have also ordered an ultrasound of your liver for routine purposes.   We will see you back in 2 months. We will do more lab work in 5 months.

## 2016-09-06 NOTE — Assessment & Plan Note (Signed)
4 weeks into Harvoni treatment, doing well. Check Hep C RNA, PT/INR, CMP, CBC. Return in 2 months and repeat labs in 5 months. Discussed avoidance of PPI unless absolutely necessary. He is to take it with Harvoni if needs. Compliant with therapy. Up-to-date on variceal screening. Routine US abdomen now as he has F3/F4 history.

## 2016-09-07 NOTE — Progress Notes (Signed)
cc'ed to pcp °

## 2016-09-08 ENCOUNTER — Ambulatory Visit (HOSPITAL_COMMUNITY)
Admission: RE | Admit: 2016-09-08 | Discharge: 2016-09-08 | Disposition: A | Discharge status: Home / Self Care | Payer: Self-pay | Source: Ambulatory Visit | Attending: Gastroenterology | Admitting: Gastroenterology

## 2016-09-08 DIAGNOSIS — N281 Cyst of kidney, acquired: Secondary | ICD-10-CM | POA: Insufficient documentation

## 2016-09-08 DIAGNOSIS — B182 Chronic viral hepatitis C: Secondary | ICD-10-CM | POA: Insufficient documentation

## 2016-09-11 NOTE — Progress Notes (Signed)
[place] without [place]. Needs to complete labs as requested at visit.

## 2016-09-11 NOTE — Progress Notes (Signed)
LMOM that [place] was fine and to please complete the lab work previously ordered. Call with questions.

## 2016-09-20 ENCOUNTER — Other Ambulatory Visit: Payer: Self-pay | Admitting: Physician Assistant

## 2016-09-20 DIAGNOSIS — B182 Chronic viral hepatitis C: Secondary | ICD-10-CM

## 2016-09-20 LAB — COMPLETE METABOLIC PANEL WITH GFR
ALBUMIN: 4.1 g/dL (ref 3.6–5.1)
ALK PHOS: 85 U/L (ref 40–115)
ALT: 25 U/L (ref 9–46)
AST: 30 U/L (ref 10–35)
BUN: 10 mg/dL (ref 7–25)
CALCIUM: 10.4 mg/dL — AB (ref 8.6–10.3)
CHLORIDE: 101 mmol/L (ref 98–110)
CO2: 29 mmol/L (ref 20–31)
Creat: 0.75 mg/dL (ref 0.70–1.33)
GFR, Est Non African American: >89 mL/min (ref >=60)
Glucose, Bld: 93 mg/dL (ref 65–99)
POTASSIUM: 5.6 mmol/L — AB (ref 3.5–5.3)
Sodium: 138 mmol/L (ref 135–146)
Total Bilirubin: 0.6 mg/dL (ref 0.2–1.2)
Total Protein: 8.3 g/dL — ABNORMAL HIGH (ref 6.1–8.1)

## 2016-09-20 LAB — CBC
HEMATOCRIT: 48.9 % (ref 38.5–50.0)
Hemoglobin: 16.5 g/dL (ref 13.2–17.1)
MCH: 34.7 pg — AB (ref 27.0–33.0)
MCHC: 33.7 g/dL (ref 32.0–36.0)
MCV: 102.7 fL — AB (ref 80.0–100.0)
MPV: 10.4 fL (ref 7.5–12.5)
Platelets: 357 10*3/uL (ref 140–400)
RBC: 4.76 MIL/uL (ref 4.20–5.80)
RDW: 13.1 % (ref 11.0–15.0)
WBC: 9.2 10*3/uL (ref 3.8–10.8)

## 2016-09-20 LAB — PROTIME-INR
INR: 1.1
Prothrombin Time: 11.2 s (ref 9.0–11.5)

## 2016-09-24 LAB — HEPATITIS C RNA QUANTITATIVE
HCV QUANT LOG: NOT DETECTED {Log_IU}/mL (ref <1.18)
HCV QUANT: NOT DETECTED [IU]/mL (ref <15)

## 2016-09-26 ENCOUNTER — Encounter: Payer: Self-pay | Admitting: Physician Assistant

## 2016-09-26 ENCOUNTER — Ambulatory Visit: Payer: Self-pay | Admitting: Physician Assistant

## 2016-09-26 VITALS — BP 136/88 | HR 79 | Temp 97.9°F | Ht 72.0 in | Wt 180.0 lb

## 2016-09-26 DIAGNOSIS — J449 Chronic obstructive pulmonary disease, unspecified: Secondary | ICD-10-CM

## 2016-09-26 DIAGNOSIS — I159 Secondary hypertension, unspecified: Secondary | ICD-10-CM

## 2016-09-26 DIAGNOSIS — B1920 Unspecified viral hepatitis C without hepatic coma: Secondary | ICD-10-CM

## 2016-09-26 DIAGNOSIS — F17218 Nicotine dependence, cigarettes, with other nicotine-induced disorders: Secondary | ICD-10-CM

## 2016-09-26 DIAGNOSIS — R062 Wheezing: Secondary | ICD-10-CM

## 2016-09-26 MED ORDER — ALBUTEROL SULFATE (2.5 MG/3ML) 0.083% IN NEBU
2.5000 mg | INHALATION_SOLUTION | Freq: Once | RESPIRATORY_TRACT | Status: AC
Start: 2016-09-26 — End: 2016-09-26
  Administered 2016-09-26: 2.5 mg via RESPIRATORY_TRACT

## 2016-09-26 NOTE — Progress Notes (Signed)
BP 136/88 (BP Location: Left Arm, Patient Position: Sitting, Cuff Size: Normal)   Pulse 79   Temp 97.9 F (36.6 C)   Ht 6' (1.829 m)   Wt 180 lb (81.6 kg)   SpO2 98%   BMI 24.41 kg/m    Subjective:    Patient ID: Derek [name], male    DOB: [birth year], 56 y.o.   MRN: [id number]  HPI: Derek [name] is a 56 y.o. male presenting on 09/26/2016 for Hypertension and COPD   HPI   Pt out of dulera- needs to renew with [facility].  Pt admits to wheezing but says breathing "isn't too bad".  He continues to smoke 2 ppd  Relevant past medical, surgical, family and social history reviewed and updated as indicated. Interim medical history since our last visit reviewed. Allergies and medications reviewed and updated.   Current Outpatient Prescriptions:  .  albuterol (PROVENTIL HFA;VENTOLIN HFA) 108 (90 Base) MCG/ACT inhaler, Inhale 2 puffs into the lungs every 6 (six) hours as needed for wheezing or shortness of breath., Disp: 3 Inhaler, Rfl: 1 .  albuterol (PROVENTIL) (2.5 MG/3ML) 0.083% nebulizer solution, Take 3 mLs (2.5 mg total) by nebulization every 6 (six) hours as needed for wheezing or shortness of breath., Disp: 150 mL, Rfl: 1 .  Cyanocobalamin (VITAMIN B-12 PO), Take 1 tablet by mouth daily., Disp: , Rfl:  .  Ledipasvir-Sofosbuvir (HARVONI PO), Take 1 tablet by mouth daily., Disp: , Rfl:  .  lisinopril (PRINIVIL,ZESTRIL) 10 MG tablet, Take 1 tablet (10 mg total) by mouth daily., Disp: 30 tablet, Rfl: 1 .  naltrexone (DEPADE) 50 MG tablet, Take 50 mg by mouth daily., Disp: , Rfl: 2 .  mometasone-formoterol (DULERA) 200-5 MCG/ACT AERO, Inhale 2 puffs into the lungs 2 (two) times daily. (Patient not taking: Reported on 09/26/2016), Disp: 3 Inhaler, Rfl: 1 .  omeprazole (PRILOSEC) 20 MG capsule, 1 po every morning 30 minutes prior to your first meal. (Patient not taking: Reported on 09/26/2016), Disp: 31 capsule, Rfl: 11   Review of Systems  Constitutional: Negative for appetite  change, chills, diaphoresis, fatigue, fever and unexpected weight change.  HENT: Positive for congestion. Negative for dental problem, drooling, ear pain, facial swelling, hearing loss, mouth sores, sneezing, sore throat, trouble swallowing and voice change.   Eyes: Negative for pain, discharge, redness, itching and visual disturbance.  Respiratory: Positive for shortness of breath and wheezing. Negative for cough and choking.   Cardiovascular: Negative for chest pain, palpitations and leg swelling.  Gastrointestinal: Negative for abdominal pain, blood in stool, constipation, diarrhea and vomiting.  Endocrine: Negative for cold intolerance, heat intolerance and polydipsia.  Genitourinary: Negative for decreased urine volume, dysuria and hematuria.  Musculoskeletal: Negative for arthralgias, back pain and gait problem.  Skin: Negative for rash.  Allergic/Immunologic: Negative for environmental allergies.  Neurological: Negative for seizures, syncope, light-headedness and headaches.  Hematological: Negative for adenopathy.  Psychiatric/Behavioral: Negative for agitation, dysphoric mood and suicidal ideas. The patient is not nervous/anxious.     Per HPI unless specifically indicated above     Objective:    BP 136/88 (BP Location: Left Arm, Patient Position: Sitting, Cuff Size: Normal)   Pulse 79   Temp 97.9 F (36.6 C)   Ht 6' (1.829 m)   Wt 180 lb (81.6 kg)   SpO2 98%   BMI 24.41 kg/m   Wt Readings from Last 3 Encounters:  09/26/16 180 lb (81.6 kg)  09/05/16 177 lb (80.3 kg)  08/24/16 176 lb  12 oz (80.2 kg)    Physical Exam  Constitutional: He is oriented to person, place, and time. He appears well-developed and well-nourished.  HENT:  Head: Normocephalic and atraumatic.  Neck: Neck supple.  Cardiovascular: Normal rate and regular rhythm.   Pulmonary/Chest: Effort normal. No tachypnea and no bradypnea. No respiratory distress. He has wheezes. He has no rhonchi. He has no rales.   After nebulizer treatment, wheezes lessened and air flow increased.   Abdominal: Soft. Bowel sounds are normal. There is no hepatosplenomegaly. There is no tenderness.  Musculoskeletal: He exhibits no edema.  Lymphadenopathy:    He has no cervical adenopathy.  Neurological: He is alert and oriented to person, place, and time.  Skin: Skin is warm and dry.  Psychiatric: He has a normal mood and affect. His behavior is normal.  Vitals reviewed.   Results for orders placed or performed in visit on 09/20/16  CBC  Result Value Ref Range   WBC 9.2 3.8 - 10.8 K/uL   RBC 4.76 4.20 - 5.80 MIL/uL   Hemoglobin 16.5 13.2 - 17.1 g/dL   HCT 48.9 38.5 - 50.0 %   MCV 102.7 (H) 80.0 - 100.0 fL   MCH 34.7 (H) 27.0 - 33.0 pg   MCHC 33.7 32.0 - 36.0 g/dL   RDW 13.1 11.0 - 15.0 %   Platelets 357 140 - 400 K/uL   MPV 10.4 7.5 - 12.5 fL  COMPLETE METABOLIC PANEL WITH GFR  Result Value Ref Range   Sodium 138 135 - 146 mmol/L   Potassium 5.6 (H) 3.5 - 5.3 mmol/L   Chloride 101 98 - 110 mmol/L   CO2 29 20 - 31 mmol/L   Glucose, Bld 93 65 - 99 mg/dL   BUN 10 7 - 25 mg/dL   Creat 0.75 0.70 - 1.33 mg/dL   Total Bilirubin 0.6 0.2 - 1.2 mg/dL   Alkaline Phosphatase 85 40 - 115 U/L   AST 30 10 - 35 U/L   ALT 25 9 - 46 U/L   Total Protein 8.3 (H) 6.1 - 8.1 g/dL   Albumin 4.1 3.6 - 5.1 g/dL   Calcium 10.4 (H) 8.6 - 10.3 mg/dL   GFR, Est African American >89 >=60 mL/min   GFR, Est Non African American >89 >=60 mL/min  Hepatitis C RNA quantitative  Result Value Ref Range   HCV Quantitative <15 NOT DETECTED <15 IU/mL   HCV Quantitative Log <1.18 NOT DETECTED <1.18 Log IU/mL  INR/PT  Result Value Ref Range   Prothrombin Time 11.2 9.0 - 11.5 sec   INR 1.1       Assessment & Plan:    Encounter Diagnoses  Name Primary?  . Chronic obstructive pulmonary disease, unspecified COPD type ([place]) Yes  . Wheezing   . Cigarette nicotine dependence with other nicotine-induced disorder   . Secondary  hypertension   . Hepatitis C virus infection without hepatic coma, unspecified chronicity    -Pt renewed with [facility].  He is given sample dulera -Counseled smoking cessation -Continue current medications -follow up 3 months.  RTO sooner prn

## 2016-09-27 ENCOUNTER — Other Ambulatory Visit: Payer: Self-pay | Admitting: Physician Assistant

## 2016-09-27 DIAGNOSIS — E875 Hyperkalemia: Secondary | ICD-10-CM

## 2016-09-28 ENCOUNTER — Telehealth: Payer: Self-pay | Admitting: Gastroenterology

## 2016-09-28 NOTE — Telephone Encounter (Signed)
Pt is coming in the morning to pick up his harvoni

## 2016-09-28 NOTE — Telephone Encounter (Signed)
harvoni is at the front desk.

## 2016-10-02 NOTE — Progress Notes (Signed)
HCV RNA not detected. CONTINUE HARVONI. Looks like this was routed to PCP, who addressed hyperkalemia. HOWEVER, he has not gotten it rechecked. We need to see if he will do this ASAP.

## 2016-10-02 NOTE — Progress Notes (Signed)
LMOM for a return call.  

## 2016-10-03 NOTE — Progress Notes (Signed)
LMOM for pt to call and it is very important.

## 2016-10-03 NOTE — Progress Notes (Signed)
Mailing a letter to pt with results and that he needs to have his potassium checked, his PCP has been trying to get in touch with him also.

## 2016-10-04 LAB — POTASSIUM: Potassium: 4.7 mmol/L (ref 3.5–5.3)

## 2016-11-06 ENCOUNTER — Ambulatory Visit: Payer: Self-pay | Admitting: Gastroenterology

## 2016-11-08 ENCOUNTER — Other Ambulatory Visit: Payer: Self-pay | Admitting: Physician Assistant

## 2016-11-08 MED ORDER — ALBUTEROL SULFATE (2.5 MG/3ML) 0.083% IN NEBU: 2.5000 mg | INHALATION_SOLUTION | Freq: Four times a day (QID) | RESPIRATORY_TRACT | 1 refills | Status: DC | PRN

## 2016-11-08 MED ORDER — MOMETASONE FURO-FORMOTEROL FUM 200-5 MCG/ACT IN AERO: 2.0000 | INHALATION_SPRAY | Freq: Two times a day (BID) | RESPIRATORY_TRACT | 1 refills | Status: DC

## 2016-11-08 MED ORDER — LISINOPRIL 10 MG PO TABS: 10.0000 mg | ORAL_TABLET | Freq: Every day | ORAL | 1 refills | Status: DC

## 2016-11-08 MED ORDER — ALBUTEROL SULFATE HFA 108 (90 BASE) MCG/ACT IN AERS: 2.0000 | INHALATION_SPRAY | Freq: Four times a day (QID) | RESPIRATORY_TRACT | 1 refills | Status: AC | PRN

## 2016-11-30 ENCOUNTER — Encounter: Payer: Self-pay | Admitting: Gastroenterology

## 2016-11-30 ENCOUNTER — Ambulatory Visit (INDEPENDENT_AMBULATORY_CARE_PROVIDER_SITE_OTHER): Payer: Self-pay | Admitting: Gastroenterology

## 2016-11-30 ENCOUNTER — Other Ambulatory Visit: Payer: Self-pay | Admitting: Gastroenterology

## 2016-11-30 VITALS — BP 140/84 | HR 78 | Temp 98.0°F | Ht 72.0 in | Wt 177.6 lb

## 2016-11-30 DIAGNOSIS — B182 Chronic viral hepatitis C: Secondary | ICD-10-CM

## 2016-11-30 NOTE — Progress Notes (Signed)
CC'ED TO PCP 

## 2016-11-30 NOTE — Progress Notes (Signed)
Referring Provider: Soyla Dryer, PA-C Primary Care Physician:  Soyla Dryer, PA-C Primary GI: Dr. Oneida Alar   Chief Complaint  Patient presents with  . Hepatitis C    HPI:   Derek [name] is a 56 y.o. male presenting today with a history of Hep C genotype 1a, Metavir score F3/F4, EGD up-to-date. Next colonoscopy due in 5-10 years. Started Harvoni 08/03/16. He has completed treatment as of the first week of March.  Seems to have more energy. HCV RNA undetectable at 4 weeks. No abdominal pain. No N/V. No reflux. Only takes Prilosec as needed.   Past Medical History:  Diagnosis Date  . Asthma   . COPD (chronic obstructive pulmonary disease) ([place])   . Hepatitis   . Substance abuse   . Tuberculosis     Past Surgical History:  Procedure Laterality Date  . BIOPSY  11/09/2015   Procedure: BIOPSY;  Surgeon: Danie Binder, MD;  [facility];  Service: Endoscopy;;  sigmoid colon polyps x3  . COLONOSCOPY WITH PROPOFOL N/A 11/09/2015   SLF:5 POLYPSREMOVED FROM RECTO-SIGMOID COLON/INTERNAL HEMORRHOIDS  . ESOPHAGOGASTRODUODENOSCOPY (EGD) WITH PROPOFOL N/A 11/09/2015   TOI:[id number]  . EYE SURGERY Right   . HERNIA REPAIR Left   . POLYPECTOMY  11/09/2015   Procedure: POLYPECTOMY;  Surgeon: Danie Binder, MD;  [facility];  Service: Endoscopy;;  rectal polyps x3( one not retrieved    Current Outpatient Prescriptions  Medication Sig Dispense Refill  . albuterol (PROVENTIL HFA;VENTOLIN HFA) 108 (90 Base) MCG/ACT inhaler Inhale 2 puffs into the lungs every 6 (six) hours as needed for wheezing or shortness of breath. 3 Inhaler 1  . albuterol (PROVENTIL) (2.5 MG/3ML) 0.083% nebulizer solution Take 3 mLs (2.5 mg total) by nebulization every 6 (six) hours as needed for wheezing or shortness of breath. 150 mL 1  . Cyanocobalamin (VITAMIN B-12 PO) Take 1 tablet by mouth daily.    Marland Kitchen lisinopril (PRINIVIL,ZESTRIL) 10 MG tablet Take 1 tablet (10 mg total) by mouth daily. 90  tablet 1  . mometasone-formoterol (DULERA) 200-5 MCG/ACT AERO Inhale 2 puffs into the lungs 2 (two) times daily. 3 Inhaler 1  . naltrexone (DEPADE) 50 MG tablet Take 50 mg by mouth daily.  2  . omeprazole (PRILOSEC) 20 MG capsule 1 po every morning 30 minutes prior to your first meal. 31 capsule 11   No current facility-administered medications for this visit.     Allergies as of 11/30/2016  . (No Known Allergies)    Family History  Problem Relation Age of Onset  . Cancer Mother     lymph nodes  . Ulcers Father   . Cholecystitis Father   . Colon cancer Neg Hx   . Colon polyps Neg Hx     Social History   Social History  . Marital status: Divorced    Spouse name: N/A  . Number of children: N/A  . Years of education: N/A   Social History Main Topics  . Smoking status: Current Every Day Smoker    Packs/day: 2.00    Years: 40.00    Types: Cigarettes  . Smokeless tobacco: Never Used  . Alcohol use No     Comment: hx alcoholism. sober since 04-2015  . Drug use: Yes    Types: Marijuana, Cocaine     Comment: Remote cocaine use, Current marjiuana use  . Sexual activity: Not Asked   Other Topics Concern  . None   Social History Narrative  SON LIVES IN [place], [place] LIVES IN [place] AND WORKING IN [employer]. BORN AND RAISED IN [employer]. 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     Review of Systems: Negative unless mentioned in HPI   Physical Exam: BP 140/84   Pulse 78   Temp 98 F (36.7 C) (Oral)   Ht 6' (1.829 m)   Wt 177 lb 9.6 oz (80.6 kg)   BMI 24.09 kg/m  General:   Alert and oriented. No distress noted. Pleasant and cooperative.  Head:  Normocephalic and atraumatic. Eyes:  Conjuctiva clear without scleral icterus. Mouth:  Oral mucosa pink and moist. Abdomen:  +BS, soft, non-tender and non-distended. No rebound or guarding. No HSM or masses noted. Msk:  Symmetrical without gross  deformities. Normal posture. Extremities:  Without edema. Neurologic:  Alert and  oriented x4;  grossly normal neurologically. Psych:  Alert and cooperative. Normal mood and affect.  Lab Results  Component Value Date   ALT 25 09/20/2016   AST 30 09/20/2016   ALKPHOS 85 09/20/2016   BILITOT 0.6 09/20/2016   Lab Results  Component Value Date   CREATININE 0.75 09/20/2016   BUN 10 09/20/2016   NA 138 09/20/2016   K 4.7 10/04/2016   CL 101 09/20/2016   CO2 29 09/20/2016   Lab Results  Component Value Date   WBC 9.2 09/20/2016   HGB 16.5 09/20/2016   HCT 48.9 09/20/2016   MCV 102.7 (H) 09/20/2016   PLT 357 09/20/2016

## 2016-11-30 NOTE — Assessment & Plan Note (Signed)
56 year old male completing Harvoni the first week of March. Will need labs to include HCV RNA, CMP, CBC, INR in 2 months. Next US abdomen due in July 2018. Return in July 2018 for follow-up.

## 2016-11-30 NOTE — Patient Instructions (Signed)
We will do labs in 2 months and send these to you.  We will see you in July 2018!  I am glad you are doing great!

## 2016-12-11 NOTE — Progress Notes (Signed)
REVIEWED-NO ADDITIONAL RECOMMENDATIONS. 

## 2016-12-25 ENCOUNTER — Ambulatory Visit: Payer: Self-pay | Admitting: Physician Assistant

## 2016-12-25 ENCOUNTER — Encounter: Payer: Self-pay | Admitting: Physician Assistant

## 2016-12-25 VITALS — BP 126/80 | HR 84 | Temp 98.1°F | Ht 72.0 in | Wt 177.5 lb

## 2016-12-25 DIAGNOSIS — F17218 Nicotine dependence, cigarettes, with other nicotine-induced disorders: Secondary | ICD-10-CM

## 2016-12-25 DIAGNOSIS — J449 Chronic obstructive pulmonary disease, unspecified: Secondary | ICD-10-CM

## 2016-12-25 DIAGNOSIS — I10 Essential (primary) hypertension: Secondary | ICD-10-CM

## 2016-12-25 NOTE — Progress Notes (Signed)
BP 126/80 (BP Location: Left Arm, Patient Position: Sitting, Cuff Size: Normal)   Pulse 84   Temp 98.1 F (36.7 C)   Ht 6' (1.829 m)   Wt 177 lb 8 oz (80.5 kg)   SpO2 95%   BMI 24.07 kg/m    Subjective:    Patient ID: Derek [name], male    DOB: 06/14/1961, 56 y.o.   MRN: [id number]  HPI: LONNEL GJERDE is a 56 y.o. male presenting on 12/25/2016 for Hypertension and Hyperlipidemia   HPI   Pt states dulera working well.  Says breathing okay.  Says he "tries" to keep his smoking under 1 1/2 ppd  Pt says he is feeling pretty well today  Relevant past medical, surgical, family and social history reviewed and updated as indicated. Interim medical history since our last visit reviewed. Allergies and medications reviewed and updated.   Current Outpatient Prescriptions:  .  albuterol (PROVENTIL HFA;VENTOLIN HFA) 108 (90 Base) MCG/ACT inhaler, Inhale 2 puffs into the lungs every 6 (six) hours as needed for wheezing or shortness of breath., Disp: 3 Inhaler, Rfl: 1 .  albuterol (PROVENTIL) (2.5 MG/3ML) 0.083% nebulizer solution, Take 3 mLs (2.5 mg total) by nebulization every 6 (six) hours as needed for wheezing or shortness of breath., Disp: 150 mL, Rfl: 1 .  Cyanocobalamin (VITAMIN B-12 PO), Take 1 tablet by mouth daily., Disp: , Rfl:  .  lisinopril (PRINIVIL,ZESTRIL) 10 MG tablet, Take 1 tablet (10 mg total) by mouth daily., Disp: 90 tablet, Rfl: 1 .  mometasone-formoterol (DULERA) 200-5 MCG/ACT AERO, Inhale 2 puffs into the lungs 2 (two) times daily., Disp: 3 Inhaler, Rfl: 1 .  naltrexone (DEPADE) 50 MG tablet, Take 50 mg by mouth daily., Disp: , Rfl: 2 .  omeprazole (PRILOSEC) 20 MG capsule, 1 po every morning 30 minutes prior to your first meal. (Patient not taking: Reported on 12/25/2016), Disp: 31 capsule, Rfl: 11   Review of Systems  Constitutional: Negative for appetite change, chills, diaphoresis, fatigue, fever and unexpected weight change.  HENT: Negative for congestion,  dental problem, drooling, ear pain, facial swelling, hearing loss, mouth sores, sneezing, sore throat, trouble swallowing and voice change.   Eyes: Negative for pain, discharge, redness, itching and visual disturbance.  Respiratory: Positive for cough and wheezing. Negative for choking and shortness of breath.   Cardiovascular: Negative for chest pain, palpitations and leg swelling.  Gastrointestinal: Negative for abdominal pain, blood in stool, constipation, diarrhea and vomiting.  Endocrine: Negative for cold intolerance, heat intolerance and polydipsia.  Genitourinary: Negative for decreased urine volume, dysuria and hematuria.  Musculoskeletal: Negative for arthralgias, back pain and gait problem.  Skin: Negative for rash.  Allergic/Immunologic: Negative for environmental allergies.  Neurological: Negative for seizures, syncope, light-headedness and headaches.  Hematological: Negative for adenopathy.  Psychiatric/Behavioral: Negative for agitation, dysphoric mood and suicidal ideas. The patient is not nervous/anxious.     Per HPI unless specifically indicated above     Objective:    BP 126/80 (BP Location: Left Arm, Patient Position: Sitting, Cuff Size: Normal)   Pulse 84   Temp 98.1 F (36.7 C)   Ht 6' (1.829 m)   Wt 177 lb 8 oz (80.5 kg)   SpO2 95%   BMI 24.07 kg/m   Wt Readings from Last 3 Encounters:  12/25/16 177 lb 8 oz (80.5 kg)  11/30/16 177 lb 9.6 oz (80.6 kg)  09/26/16 180 lb (81.6 kg)    Physical Exam  Constitutional: He is oriented  to person, place, and time. He appears well-developed and well-nourished.  HENT:  Head: Normocephalic and atraumatic.  Neck: Neck supple.  Cardiovascular: Normal rate and regular rhythm.   Pulmonary/Chest: Effort normal and breath sounds normal. He has no wheezes.  Abdominal: Soft. Bowel sounds are normal. There is no hepatosplenomegaly. There is no tenderness.  Musculoskeletal: He exhibits no edema.  Lymphadenopathy:    He has  no cervical adenopathy.  Neurological: He is alert and oriented to person, place, and time.  Skin: Skin is warm and dry.  Nails bitten.  Hands with multiple open bloody spots where he has picked.   Psychiatric: His speech is normal and behavior is normal. His mood appears anxious.  Pt pleasant but very fidgety. Can't sit still.  Vitals reviewed.       Assessment & Plan:    Encounter Diagnoses  Name Primary?  . Chronic obstructive pulmonary disease, unspecified COPD type ([place]) Yes  . Cigarette nicotine dependence with other nicotine-induced disorder   . Hypertension, unspecified type      -gave cardinal card to call for appointment for anxiety -pt counseled on smoking cessation -pt to continue current medication -pt to continue with GI for hepatitis -pt to follow up in 4 months.  RTO sooner prn

## 2017-01-08 ENCOUNTER — Ambulatory Visit: Payer: Self-pay | Admitting: Physician Assistant

## 2017-01-08 ENCOUNTER — Encounter: Payer: Self-pay | Admitting: Physician Assistant

## 2017-01-08 VITALS — BP 126/70 | HR 89 | Temp 97.5°F | Resp 22 | Ht 72.0 in | Wt 176.5 lb

## 2017-01-08 DIAGNOSIS — J441 Chronic obstructive pulmonary disease with (acute) exacerbation: Secondary | ICD-10-CM

## 2017-01-08 DIAGNOSIS — F17218 Nicotine dependence, cigarettes, with other nicotine-induced disorders: Secondary | ICD-10-CM

## 2017-01-08 MED ORDER — PREDNISONE 10 MG PO TABS: ORAL_TABLET | ORAL | 0 refills | Status: AC

## 2017-01-08 MED ORDER — SULFAMETHOXAZOLE-TRIMETHOPRIM 800-160 MG PO TABS: 1.0000 | ORAL_TABLET | Freq: Two times a day (BID) | ORAL | 0 refills | Status: DC

## 2017-01-08 NOTE — Progress Notes (Signed)
BP 126/70 (BP Location: Left Arm, Patient Position: Sitting, Cuff Size: Normal)   Pulse 89   Temp 97.5 F (36.4 C)   Resp (!) 22   Ht 6' (1.829 m)   Wt 176 lb 8 oz (80.1 kg)   SpO2 94%   BMI 23.94 kg/m    Subjective:    Patient ID: Derek [name], male    DOB: 09-23-1960, 56 y.o.   MRN: [id number]  HPI: Derek [name] is a 56 y.o. male presenting on 01/08/2017 for Nasal Congestion   HPI Pt states cough and congestion started Saturday.  Pt has been using his albuterol inhaler and nebulizer more than usual. + wheezing.  Pt is still smoking more than a pack a day. No fever.   Relevant past medical, surgical, family and social history reviewed and updated as indicated. Interim medical history since our last visit reviewed. Allergies and medications reviewed and updated.   Current Outpatient Prescriptions:  .  albuterol (PROVENTIL HFA;VENTOLIN HFA) 108 (90 Base) MCG/ACT inhaler, Inhale 2 puffs into the lungs every 6 (six) hours as needed for wheezing or shortness of breath., Disp: 3 Inhaler, Rfl: 1 .  albuterol (PROVENTIL) (2.5 MG/3ML) 0.083% nebulizer solution, Take 3 mLs (2.5 mg total) by nebulization every 6 (six) hours as needed for wheezing or shortness of breath., Disp: 150 mL, Rfl: 1 .  Cyanocobalamin (VITAMIN B-12 PO), Take 1 tablet by mouth daily., Disp: , Rfl:  .  lisinopril (PRINIVIL,ZESTRIL) 10 MG tablet, Take 1 tablet (10 mg total) by mouth daily., Disp: 90 tablet, Rfl: 1 .  mometasone-formoterol (DULERA) 200-5 MCG/ACT AERO, Inhale 2 puffs into the lungs 2 (two) times daily., Disp: 3 Inhaler, Rfl: 1 .  naltrexone (DEPADE) 50 MG tablet, Take 50 mg by mouth daily., Disp: , Rfl: 2 .  omeprazole (PRILOSEC) 20 MG capsule, 1 po every morning 30 minutes prior to your first meal. (Patient not taking: Reported on 01/08/2017), Disp: 31 capsule, Rfl: 11  Review of Systems  Constitutional: Negative for appetite change, chills, diaphoresis, fatigue, fever and unexpected weight  change.  HENT: Positive for congestion and sore throat. Negative for dental problem, drooling, ear pain, facial swelling, hearing loss, mouth sores, sneezing, trouble swallowing and voice change.   Eyes: Negative for pain, discharge, redness, itching and visual disturbance.  Respiratory: Positive for shortness of breath and wheezing. Negative for cough and choking.   Cardiovascular: Negative for chest pain, palpitations and leg swelling.  Gastrointestinal: Negative for abdominal pain, blood in stool, constipation, diarrhea and vomiting.  Endocrine: Negative for cold intolerance, heat intolerance and polydipsia.  Genitourinary: Negative for decreased urine volume, dysuria and hematuria.  Musculoskeletal: Negative for arthralgias, back pain and gait problem.  Skin: Negative for rash.  Allergic/Immunologic: Negative for environmental allergies.  Neurological: Negative for seizures, syncope, light-headedness and headaches.  Hematological: Negative for adenopathy.  Psychiatric/Behavioral: Negative for agitation, dysphoric mood and suicidal ideas. The patient is not nervous/anxious.     Per HPI unless specifically indicated above     Objective:    BP 126/70 (BP Location: Left Arm, Patient Position: Sitting, Cuff Size: Normal)   Pulse 89   Temp 97.5 F (36.4 C)   Resp (!) 22   Ht 6' (1.829 m)   Wt 176 lb 8 oz (80.1 kg)   SpO2 94%   BMI 23.94 kg/m   Wt Readings from Last 3 Encounters:  01/08/17 176 lb 8 oz (80.1 kg)  12/25/16 177 lb 8 oz (80.5 kg)  11/30/16 177 lb 9.6 oz (80.6 kg)    Physical Exam  Constitutional: He is oriented to person, place, and time. He appears well-developed and well-nourished.  HENT:  Head: Normocephalic and atraumatic.  Right Ear: Hearing, tympanic membrane, external ear and ear canal normal.  Left Ear: Hearing, tympanic membrane, external ear and ear canal normal.  Nose: Nose normal.  Mouth/Throat: Uvula is midline and oropharynx is clear and moist. No  uvula swelling. No oropharyngeal exudate, posterior oropharyngeal edema, posterior oropharyngeal erythema or tonsillar abscesses.  Neck: Neck supple.  Cardiovascular: Normal rate and regular rhythm.   Pulmonary/Chest: Effort normal. No respiratory distress. He has wheezes. He has no rales.  Lymphadenopathy:    He has no cervical adenopathy.  Neurological: He is alert and oriented to person, place, and time.  Skin: Skin is warm and dry.  Psychiatric: He has a normal mood and affect. His behavior is normal.  Vitals reviewed.       Assessment & Plan:   Encounter Diagnoses  Name Primary?  Marland Kitchen COPD exacerbation ([place]) Yes  . Cigarette nicotine dependence with other nicotine-induced disorder      -rx prednisone taper and septra -counseled pt No smoking -F/u as scheduled. RTO sooner prn new symptoms or if persists

## 2017-01-08 NOTE — Patient Instructions (Signed)
Chronic Obstructive Pulmonary Disease Exacerbation  Chronic obstructive pulmonary disease (COPD) is a common lung problem. In COPD, the flow of air from the lungs is limited. COPD exacerbations are times that breathing gets worse and you need extra treatment. Without treatment they can be life threatening. If they happen often, your lungs can become more damaged. If your COPD gets worse, your doctor may treat you with:  ? Medicines.  ? Oxygen.  ? Different ways to clear your airway, such as using a mask.    Follow these instructions at home:  ? Do not smoke.  ? Avoid tobacco smoke and other things that bother your lungs.  ? If given, take your antibiotic medicine as told. Finish the medicine even if you start to feel better.  ? Only take medicines as told by your doctor.  ? Drink enough fluids to keep your pee (urine) clear or pale yellow (unless your doctor has told you not to).  ? Use a cool mist machine (vaporizer).  ? If you use oxygen or a machine that turns liquid medicine into a mist (nebulizer), continue to use them as told.  ? Keep up with shots (vaccinations) as told by your doctor.  ? Exercise regularly.  ? Eat healthy foods.  ? Keep all doctor visits as told.  Get help right away if:  ? You are very short of breath and it gets worse.  ? You have trouble talking.  ? You have bad chest pain.  ? You have blood in your spit (sputum).  ? You have a fever.  ? You keep throwing up (vomiting).  ? You feel weak, or you pass out (faint).  ? You feel confused.  ? You keep getting worse.  This information is not intended to replace advice given to you by your health care provider. Make sure you discuss any questions you have with your health care provider.  Document Released: 08/03/2011 Document Revised: 01/20/2016 Document Reviewed: 04/18/2013  Elsevier Interactive Patient Education ? 2017 [employer] Inc[employer]

## 2017-01-15 ENCOUNTER — Other Ambulatory Visit: Payer: Self-pay

## 2017-01-15 DIAGNOSIS — B182 Chronic viral hepatitis C: Secondary | ICD-10-CM

## 2017-03-05 ENCOUNTER — Ambulatory Visit (INDEPENDENT_AMBULATORY_CARE_PROVIDER_SITE_OTHER): Payer: Self-pay | Admitting: Gastroenterology

## 2017-03-05 ENCOUNTER — Encounter: Payer: Self-pay | Admitting: Gastroenterology

## 2017-03-05 VITALS — BP 157/90 | HR 107 | Temp 97.4°F | Ht 72.0 in | Wt 176.4 lb

## 2017-03-05 DIAGNOSIS — B182 Chronic viral hepatitis C: Secondary | ICD-10-CM

## 2017-03-05 NOTE — Progress Notes (Signed)
Referring Provider: Soyla Dryer, PA-C Primary Care Physician:  Soyla Dryer, PA-C Primary GI: Dr. Oneida Alar   Chief Complaint  Patient presents with  . Hepatitis C    HPI:   JAQUES MINEER is a 56 y.o. male presenting today with a history of Hep C genotype 1a, Metavir score F3/F4, EGD up-to-date. Next colonoscopy due in 5-10 years. Started Harvoni 08/03/16. Completed treatment early March. Due for HCV RNA, CMP, CBC, INR now. US abdomen due now.   Drinks a 6 pack of beer about twice a week. No abdominal pain, N/V, confusion, mental status changes, jaundice, rectal bleeding. Feels much better since treatment. Going to see his daughter soon in [place].   Past Medical History:  Diagnosis Date  . Asthma   . COPD (chronic obstructive pulmonary disease) ([place])   . Hepatitis   . Substance abuse   . Tuberculosis     Past Surgical History:  Procedure Laterality Date  . BIOPSY  11/09/2015   Procedure: BIOPSY;  Surgeon: Danie Binder, MD;  [facility];  Service: Endoscopy;;  sigmoid colon polyps x3  . COLONOSCOPY WITH PROPOFOL N/A 11/09/2015   SLF:5 POLYPSREMOVED FROM RECTO-SIGMOID COLON/INTERNAL HEMORRHOIDS  . ESOPHAGOGASTRODUODENOSCOPY (EGD) WITH PROPOFOL N/A 11/09/2015   HAL:[id number]  . EYE SURGERY Right   . HERNIA REPAIR Left   . POLYPECTOMY  11/09/2015   Procedure: POLYPECTOMY;  Surgeon: Danie Binder, MD;  [facility];  Service: Endoscopy;;  rectal polyps x3( one not retrieved    Current Outpatient Prescriptions  Medication Sig Dispense Refill  . albuterol (PROVENTIL HFA;VENTOLIN HFA) 108 (90 Base) MCG/ACT inhaler Inhale 2 puffs into the lungs every 6 (six) hours as needed for wheezing or shortness of breath. 3 Inhaler 1  . albuterol (PROVENTIL) (2.5 MG/3ML) 0.083% nebulizer solution Take 3 mLs (2.5 mg total) by nebulization every 6 (six) hours as needed for wheezing or shortness of breath. 150 mL 1  . Cyanocobalamin (VITAMIN B-12 PO) Take 1 tablet  by mouth daily.    Marland Kitchen lisinopril (PRINIVIL,ZESTRIL) 10 MG tablet Take 1 tablet (10 mg total) by mouth daily. 90 tablet 1  . mometasone-formoterol (DULERA) 200-5 MCG/ACT AERO Inhale 2 puffs into the lungs 2 (two) times daily. 3 Inhaler 1  . naltrexone (DEPADE) 50 MG tablet Take 50 mg by mouth daily.  2  . omeprazole (PRILOSEC) 20 MG capsule 1 po every morning 30 minutes prior to your first meal. (Patient not taking: Reported on 01/08/2017) 31 capsule 11  . sulfamethoxazole-trimethoprim (BACTRIM DS,SEPTRA DS) 800-160 MG tablet Take 1 tablet by mouth 2 (two) times daily. (Patient not taking: Reported on 03/05/2017) 14 tablet 0   No current facility-administered medications for this visit.     Allergies as of 03/05/2017  . (No Known Allergies)    Family History  Problem Relation Age of Onset  . Cancer Mother        lymph nodes  . Ulcers Father   . Cholecystitis Father   . Colon cancer Neg Hx   . Colon polyps Neg Hx     Social History   Social History  . Marital status: Divorced    Spouse name: N/A  . Number of children: N/A  . Years of education: N/A   Social History Main Topics  . Smoking status: Current Every Day Smoker    Packs/day: 2.00    Years: 40.00    Types: Cigarettes  . Smokeless tobacco: Never Used  . Alcohol use No  Comment: hx alcoholism. sober since 04-2015  . Drug use: Yes    Types: Marijuana, Cocaine     Comment: Remote cocaine use, Current marjiuana use  . Sexual activity: Not Asked   Other Topics Concern  . None   Social History Narrative   SON LIVES IN [place], [place] LIVES IN [place] AND WORKING IN [employer]. BORN AND RAISED IN [employer]. 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     Review of Systems: As mentioned in HPI   Physical Exam: BP (!) 157/90   Pulse (!) 107   Temp (!) 97.4 F (36.3 C) (Oral)   Ht 6' (1.829 m)   Wt 176 lb 6.4 oz (80 kg)   BMI 23.92 kg/m  General:    Alert and oriented. No distress noted. Pleasant and cooperative.  Head:  Normocephalic and atraumatic. Eyes:  Conjuctiva clear without scleral icterus. Mouth:  Oral mucosa pink and moist. Good dentition. No lesions. Abdomen:  +BS, soft, non-tender and non-distended. No rebound or guarding. No HSM or masses noted. Msk:  Symmetrical without gross deformities. Normal posture. Extremities:  Without edema. Neurologic:  Alert and  oriented x4;  grossly normal neurologically. Psych:  Alert and cooperative. Normal mood and affect.

## 2017-03-05 NOTE — Patient Instructions (Signed)
Please complete labs today and the ultrasound as scheduled.  I am glad you are doing well!  We will see you back in 1 year!  Cut back/eliminate alcohol. It is best for your liver to avoid this.

## 2017-03-06 NOTE — Assessment & Plan Note (Signed)
56 year old male completing course of Harvoni in early March, due for 3 month post-treatment labs now. US abdomen due as well. History of Metavir score F3/F4. Discussed avoidance of ETOH due to history of liver disease. Return in 1 year or sooner as indicated.

## 2017-03-07 NOTE — Progress Notes (Signed)
CC'D TO PCP °

## 2017-03-09 ENCOUNTER — Ambulatory Visit (HOSPITAL_COMMUNITY)
Admission: RE | Admit: 2017-03-09 | Discharge: 2017-03-09 | Disposition: A | Discharge status: Home / Self Care | Payer: Medicaid Other | Source: Ambulatory Visit | Attending: Gastroenterology | Admitting: Gastroenterology

## 2017-03-09 DIAGNOSIS — B182 Chronic viral hepatitis C: Secondary | ICD-10-CM | POA: Insufficient documentation

## 2017-03-09 DIAGNOSIS — N281 Cyst of kidney, acquired: Secondary | ICD-10-CM | POA: Diagnosis not present

## 2017-03-09 DIAGNOSIS — K7460 Unspecified cirrhosis of liver: Secondary | ICD-10-CM | POA: Diagnosis present

## 2017-03-20 NOTE — Progress Notes (Signed)
LMOM for a return call.  

## 2017-03-22 NOTE — Progress Notes (Signed)
LMOM for a return call.  

## 2017-03-26 ENCOUNTER — Telehealth: Payer: Self-pay

## 2017-03-26 NOTE — Progress Notes (Signed)
LMOM that [place] was OK to call if questions.

## 2017-03-26 NOTE — Telephone Encounter (Signed)
Pt is aware of results from the [place].

## 2017-03-26 NOTE — Progress Notes (Signed)
LMOM to call.

## 2017-04-01 NOTE — Progress Notes (Signed)
REVIEWED-NO ADDITIONAL RECOMMENDATIONS. 

## 2017-04-17 ENCOUNTER — Other Ambulatory Visit: Payer: Self-pay | Admitting: Physician Assistant

## 2017-04-17 MED ORDER — ALBUTEROL SULFATE (2.5 MG/3ML) 0.083% IN NEBU: 2.5000 mg | INHALATION_SOLUTION | Freq: Four times a day (QID) | RESPIRATORY_TRACT | 1 refills | Status: DC | PRN

## 2017-04-19 ENCOUNTER — Encounter: Payer: Self-pay | Admitting: Physician Assistant

## 2017-04-26 ENCOUNTER — Ambulatory Visit: Payer: Self-pay | Admitting: Physician Assistant

## 2017-05-01 ENCOUNTER — Encounter: Payer: Self-pay | Admitting: Physician Assistant

## 2017-05-01 ENCOUNTER — Ambulatory Visit: Payer: Self-pay | Admitting: Physician Assistant

## 2017-05-01 VITALS — BP 130/60 | HR 106 | Temp 97.5°F | Ht 72.0 in | Wt 178.0 lb

## 2017-05-01 DIAGNOSIS — J449 Chronic obstructive pulmonary disease, unspecified: Secondary | ICD-10-CM

## 2017-05-01 DIAGNOSIS — B1920 Unspecified viral hepatitis C without hepatic coma: Secondary | ICD-10-CM

## 2017-05-01 DIAGNOSIS — Z125 Encounter for screening for malignant neoplasm of prostate: Secondary | ICD-10-CM

## 2017-05-01 DIAGNOSIS — I10 Essential (primary) hypertension: Secondary | ICD-10-CM

## 2017-05-01 DIAGNOSIS — Z13220 Encounter for screening for lipoid disorders: Secondary | ICD-10-CM

## 2017-05-01 DIAGNOSIS — F17218 Nicotine dependence, cigarettes, with other nicotine-induced disorders: Secondary | ICD-10-CM

## 2017-05-01 NOTE — Progress Notes (Signed)
BP 130/60 (BP Location: Left Arm, Patient Position: Sitting, Cuff Size: Normal)   Pulse (!) 106   Temp (!) 97.5 F (36.4 C)   Ht 6' (1.829 m)   Wt 178 lb (80.7 kg)   SpO2 97%   BMI 24.14 kg/m    Subjective:    Patient ID: Derek [name], male    DOB: 22-Oct-1960, 56 y.o.   MRN: [id number]  HPI: Derek [name] is a 56 y.o. male presenting on 05/01/2017 for COPD and Hypertension   HPI   Pt didn't contact [facility] fo help with his anxiety.   He is still smoking but has cut back to about 1 ppd.  He is continuing with GI for hepatitis treatment which he has finished.   He didn't get the labs that GI ordered.   He is still drinking alcohol sometimes  Relevant past medical, surgical, family and social history reviewed and updated as indicated. Interim medical history since our last visit reviewed. Allergies and medications reviewed and updated.   Current Outpatient Prescriptions:  .  albuterol (PROVENTIL HFA;VENTOLIN HFA) 108 (90 Base) MCG/ACT inhaler, Inhale 2 puffs into the lungs every 6 (six) hours as needed for wheezing or shortness of breath., Disp: 3 Inhaler, Rfl: 1 .  albuterol (PROVENTIL) (2.5 MG/3ML) 0.083% nebulizer solution, Take 3 mLs (2.5 mg total) by nebulization every 6 (six) hours as needed for wheezing or shortness of breath., Disp: 150 mL, Rfl: 1 .  Cyanocobalamin (VITAMIN B-12 PO), Take 1 tablet by mouth daily., Disp: , Rfl:  .  lisinopril (PRINIVIL,ZESTRIL) 10 MG tablet, Take 1 tablet (10 mg total) by mouth daily., Disp: 90 tablet, Rfl: 1 .  mometasone-formoterol (DULERA) 200-5 MCG/ACT AERO, Inhale 2 puffs into the lungs 2 (two) times daily., Disp: 3 Inhaler, Rfl: 1   Review of Systems  Constitutional: Negative for appetite change, chills, diaphoresis, fatigue, fever and unexpected weight change.  HENT: Negative for congestion, dental problem, drooling, ear pain, facial swelling, hearing loss, mouth sores, sneezing, sore throat, trouble swallowing and voice  change.   Eyes: Negative for pain, discharge, redness, itching and visual disturbance.  Respiratory: Positive for cough, shortness of breath and wheezing. Negative for choking.   Cardiovascular: Negative for chest pain, palpitations and leg swelling.  Gastrointestinal: Negative for abdominal pain, blood in stool, constipation, diarrhea and vomiting.  Endocrine: Negative for cold intolerance, heat intolerance and polydipsia.  Genitourinary: Negative for decreased urine volume, dysuria and hematuria.  Musculoskeletal: Negative for arthralgias, back pain and gait problem.  Skin: Negative for rash.  Allergic/Immunologic: Negative for environmental allergies.  Neurological: Negative for seizures, syncope, light-headedness and headaches.  Hematological: Negative for adenopathy.  Psychiatric/Behavioral: Negative for agitation, dysphoric mood and suicidal ideas. The patient is not nervous/anxious.     Per HPI unless specifically indicated above     Objective:    BP 130/60 (BP Location: Left Arm, Patient Position: Sitting, Cuff Size: Normal)   Pulse (!) 106   Temp (!) 97.5 F (36.4 C)   Ht 6' (1.829 m)   Wt 178 lb (80.7 kg)   SpO2 97%   BMI 24.14 kg/m   Wt Readings from Last 3 Encounters:  05/01/17 178 lb (80.7 kg)  03/05/17 176 lb 6.4 oz (80 kg)  01/08/17 176 lb 8 oz (80.1 kg)    Physical Exam  Constitutional: He is oriented to person, place, and time. He appears well-developed and well-nourished.  HENT:  Head: Normocephalic and atraumatic.  Neck: Neck supple.  Cardiovascular:  Normal rate and regular rhythm.   Pulmonary/Chest: Effort normal. No respiratory distress. He has wheezes. He has no rales.  Abdominal: Soft. Bowel sounds are normal. There is no hepatosplenomegaly. There is no tenderness.  Musculoskeletal: He exhibits no edema.  Lymphadenopathy:    He has no cervical adenopathy.  Neurological: He is alert and oriented to person, place, and time.  Skin: Skin is warm and  dry.  Psychiatric: He has a normal mood and affect. His behavior is normal.  Vitals reviewed.       Assessment & Plan:   Encounter Diagnoses  Name Primary?  . Chronic obstructive pulmonary disease, unspecified COPD type ([place]) Yes  . Hypertension, unspecified type   . Cigarette nicotine dependence with other nicotine-induced disorder   . Hepatitis C virus infection without hepatic coma, unspecified chronicity   . Screening for prostate cancer   . Screening cholesterol level     -Discussed with pt that we will be glad to continue treating his COPD but that medications may not work so well as long as he continues to smoke.  Counseled on cessation  -Counseled pt to avoid etoh  -pt to get fasting labs drawn tomorrow.  Will call pt with results and forward GI labs to Roseanne Kaufman  -pt to follow up here in 4 months.  RTO sooner prn

## 2017-05-02 ENCOUNTER — Other Ambulatory Visit (HOSPITAL_COMMUNITY)
Admission: RE | Admit: 2017-05-02 | Discharge: 2017-05-02 | Disposition: A | Discharge status: Home / Self Care | Payer: Medicaid Other | Source: Ambulatory Visit | Attending: Physician Assistant | Admitting: Physician Assistant

## 2017-05-02 DIAGNOSIS — Z125 Encounter for screening for malignant neoplasm of prostate: Secondary | ICD-10-CM | POA: Insufficient documentation

## 2017-05-02 DIAGNOSIS — B1920 Unspecified viral hepatitis C without hepatic coma: Secondary | ICD-10-CM | POA: Insufficient documentation

## 2017-05-02 DIAGNOSIS — I10 Essential (primary) hypertension: Secondary | ICD-10-CM

## 2017-05-02 DIAGNOSIS — Z13220 Encounter for screening for lipoid disorders: Secondary | ICD-10-CM

## 2017-05-02 LAB — CBC WITH DIFFERENTIAL/PLATELET
BASOS ABS: 0.2 10*3/uL — AB (ref 0.0–0.1)
BASOS PCT: 2 %
Eosinophils Absolute: 0.3 10*3/uL (ref 0.0–0.7)
Eosinophils Relative: 3 %
HEMATOCRIT: 46.2 % (ref 39.0–52.0)
Hemoglobin: 15.4 g/dL (ref 13.0–17.0)
Lymphocytes Relative: 30 %
Lymphs Abs: 2.6 10*3/uL (ref 0.7–4.0)
MCH: 34.9 pg — ABNORMAL HIGH (ref 26.0–34.0)
MCHC: 33.3 g/dL (ref 30.0–36.0)
MCV: 104.8 fL — ABNORMAL HIGH (ref 78.0–100.0)
MONO ABS: 0.8 10*3/uL (ref 0.1–1.0)
Monocytes Relative: 10 %
NEUTROS ABS: 4.6 10*3/uL (ref 1.7–7.7)
Neutrophils Relative %: 55 %
PLATELETS: 270 10*3/uL (ref 150–400)
RBC: 4.41 MIL/uL (ref 4.22–5.81)
RDW: 13.5 % (ref 11.5–15.5)
WBC: 8.4 10*3/uL (ref 4.0–10.5)

## 2017-05-02 LAB — COMPREHENSIVE METABOLIC PANEL
ALBUMIN: 4.1 g/dL (ref 3.5–5.0)
ALK PHOS: 75 U/L (ref 38–126)
ALT: 24 U/L (ref 17–63)
ANION GAP: 7 (ref 5–15)
AST: 30 U/L (ref 15–41)
BILIRUBIN TOTAL: 0.6 mg/dL (ref 0.3–1.2)
BUN: 10 mg/dL (ref 6–20)
CALCIUM: 9.6 mg/dL (ref 8.9–10.3)
CO2: 30 mmol/L (ref 22–32)
Chloride: 103 mmol/L (ref 101–111)
Creatinine, Ser: 0.65 mg/dL (ref 0.61–1.24)
GFR calc non Af Amer: >60 mL/min (ref >60)
GLUCOSE: 99 mg/dL (ref 65–99)
Potassium: 4.8 mmol/L (ref 3.5–5.1)
Sodium: 140 mmol/L (ref 135–145)
TOTAL PROTEIN: 7.4 g/dL (ref 6.5–8.1)

## 2017-05-02 LAB — PROTIME-INR
INR: 0.98
Prothrombin Time: 12.9 seconds (ref 11.4–15.2)

## 2017-05-02 LAB — LIPID PANEL
CHOL/HDL RATIO: 3 ratio
Cholesterol: 127 mg/dL (ref 0–200)
HDL: 42 mg/dL (ref >40)
LDL CALC: 74 mg/dL (ref 0–99)
TRIGLYCERIDES: 54 mg/dL (ref <150)
VLDL: 11 mg/dL (ref 0–40)

## 2017-05-02 LAB — PSA: Prostatic Specific Antigen: 1.98 ng/mL (ref 0.00–4.00)

## 2017-05-03 LAB — HCV RNA QUANT RFLX ULTRA OR GENOTYP
HCV RNA Qnt(log copy/mL): UNDETERMINED log10 IU/mL
HepC Qn: NOT DETECTED IU/mL

## 2017-05-09 ENCOUNTER — Other Ambulatory Visit: Payer: Self-pay

## 2017-05-09 DIAGNOSIS — B182 Chronic viral hepatitis C: Secondary | ICD-10-CM

## 2017-05-09 NOTE — Progress Notes (Signed)
Mailing a letter to call and lab orders on file for 07/2017.

## 2017-05-09 NOTE — Progress Notes (Signed)
Great! Hep C RNA is undetectable. LFTs look good. He has macrocytosis. Is he drinking? Let's recheck CBC and HCV RNA in 3 months.

## 2017-05-09 NOTE — Progress Notes (Signed)
Tried to call and bad connection.  

## 2017-05-09 NOTE — Progress Notes (Signed)
Tried to call and could not connect.  

## 2017-07-11 ENCOUNTER — Other Ambulatory Visit: Payer: Self-pay

## 2017-07-11 DIAGNOSIS — B182 Chronic viral hepatitis C: Secondary | ICD-10-CM

## 2017-08-27 ENCOUNTER — Other Ambulatory Visit (HOSPITAL_COMMUNITY): Payer: Self-pay | Admitting: Internal Medicine

## 2017-08-27 DIAGNOSIS — R918 Other nonspecific abnormal finding of lung field: Secondary | ICD-10-CM

## 2017-09-03 ENCOUNTER — Ambulatory Visit: Payer: Self-pay | Admitting: Physician Assistant

## 2017-09-06 ENCOUNTER — Encounter: Payer: Self-pay | Admitting: Physician Assistant

## 2017-09-11 ENCOUNTER — Ambulatory Visit (HOSPITAL_COMMUNITY)
Admission: RE | Admit: 2017-09-11 | Discharge: 2017-09-11 | Disposition: A | Discharge status: Home / Self Care | Payer: Medicaid Other | Source: Ambulatory Visit | Attending: Internal Medicine | Admitting: Internal Medicine

## 2017-09-11 DIAGNOSIS — R911 Solitary pulmonary nodule: Secondary | ICD-10-CM | POA: Insufficient documentation

## 2017-09-11 DIAGNOSIS — R918 Other nonspecific abnormal finding of lung field: Secondary | ICD-10-CM

## 2017-09-11 LAB — GLUCOSE, CAPILLARY: Glucose-Capillary: 90 mg/dL (ref 65–99)

## 2017-09-11 MED ORDER — FLUDEOXYGLUCOSE F - 18 (FDG) INJECTION
10.9300 | Freq: Once | INTRAVENOUS | Status: AC | PRN
  Administered 2017-09-11: 10.93 via INTRAVENOUS

## 2017-10-25 ENCOUNTER — Encounter: Payer: Self-pay | Admitting: Thoracic Surgery (Cardiothoracic Vascular Surgery)

## 2017-10-25 ENCOUNTER — Institutional Professional Consult (permissible substitution) (INDEPENDENT_AMBULATORY_CARE_PROVIDER_SITE_OTHER): Payer: Medicaid Other | Admitting: Thoracic Surgery (Cardiothoracic Vascular Surgery)

## 2017-10-25 VITALS — BP 160/95 | HR 80 | Resp 20 | Ht 72.0 in | Wt 176.0 lb

## 2017-10-25 DIAGNOSIS — R918 Other nonspecific abnormal finding of lung field: Secondary | ICD-10-CM | POA: Diagnosis not present

## 2017-10-25 NOTE — Progress Notes (Signed)
PCP is Soyla Dryer, PA-C Referring Provider is Ribakive, Gregary Signs, MD  Chief Complaint  Patient presents with  . Lung Lesion    Surgical eval, PET Scan 09/11/17, Chest CT 08/23/17    HPI: Derek [name] is a 57 year old gentleman with a history of tobacco, marijuana, and ethanol abuse.  He also has a history of hypertension, tuberculosis, COPD, glaucoma, and hepatitis C (treated).  Back in December he was having problems with shortness of breath and generalized weakness.  A CT of the chest demonstrated bilateral pulmonary nodules, 1.2 cm in the left upper lobe and 6-7 mm in the right lower lobe.  A PET/CT showed the left upper lobe nodule was hypermetabolic.  There was increased activity in the region of the right lower lobe nodule but given its small size it could not be confirmed.  He has a 60-pack-year history of smoking.  He says he currently is smoking less than a pack a day.  He gets short of breath both with exertion and sometimes at rest.  Sometimes he will get short of breath even with tying his shoes or taking a shower.  He has a chronic cough and wheezing.  He denies chest pain, pressure, or tightness.  He denies change in appetite or weight loss. Zubrod Score: At the time of surgery this patient's most appropriate activity status/level should be described as: []     0    Normal activity, no symptoms []     1    Restricted in physical strenuous activity but ambulatory, able to do out light work [x]     2    Ambulatory and capable of self care, unable to do work activities, up and about >50 % of waking hours                              []     3    Only limited self care, in bed greater than 50% of waking hours []     4    Completely disabled, no self care, confined to bed or chair []     5    Moribund  Past Medical History:  Diagnosis Date  . Asthma   . COPD (chronic obstructive pulmonary disease) ([place])   . Emphysema lung ([place])   . Glaucoma   . Hepatitis   . Hypertension   .  Substance abuse ([place])   . Tuberculosis     Past Surgical History:  Procedure Laterality Date  . BIOPSY  11/09/2015   Procedure: BIOPSY;  Surgeon: Danie Binder, MD;  [facility];  Service: Endoscopy;;  sigmoid colon polyps x3  . COLONOSCOPY WITH PROPOFOL N/A 11/09/2015   SLF:5 POLYPSREMOVED FROM RECTO-SIGMOID COLON/INTERNAL HEMORRHOIDS  . ESOPHAGOGASTRODUODENOSCOPY (EGD) WITH PROPOFOL N/A 11/09/2015   FBP:[id number]  . EYE SURGERY Right   . HERNIA REPAIR Left   . POLYPECTOMY  11/09/2015   Procedure: POLYPECTOMY;  Surgeon: Danie Binder, MD;  [facility];  Service: Endoscopy;;  rectal polyps x3( one not retrieved    Family History  Problem Relation Age of Onset  . Cancer Mother        lymph nodes  . Ulcers Father   . Cholecystitis Father   . Colon cancer Neg Hx   . Colon polyps Neg Hx     Social History Social History   Tobacco Use  . Smoking status: Current Every Day Smoker    Packs/day: 2.00  Years: 40.00    Pack years: 80.00    Types: Cigarettes  . Smokeless tobacco: Never Used  Substance Use Topics  . Alcohol use: Yes    Comment: hx alcoholism. sober since 04-2015  . Drug use: Yes    Types: Marijuana, Cocaine    Comment: Remote cocaine use, Current marjiuana use    Current Outpatient Medications  Medication Sig Dispense Refill  . albuterol (PROVENTIL HFA;VENTOLIN HFA) 108 (90 Base) MCG/ACT inhaler Inhale 2 puffs into the lungs every 6 (six) hours as needed for wheezing or shortness of breath. 3 Inhaler 1  . albuterol (PROVENTIL) (2.5 MG/3ML) 0.083% nebulizer solution Take 3 mLs (2.5 mg total) by nebulization every 6 (six) hours as needed for wheezing or shortness of breath. 150 mL 1  . Cyanocobalamin (VITAMIN B-12 PO) Take 1 tablet by mouth daily.    Marland Kitchen lisinopril (PRINIVIL,ZESTRIL) 10 MG tablet Take 1 tablet (10 mg total) by mouth daily. 90 tablet 1  . mometasone-formoterol (DULERA) 200-5 MCG/ACT AERO Inhale 2 puffs into the lungs 2 (two)  times daily. 3 Inhaler 1   No current facility-administered medications for this visit.     No Known Allergies  Review of Systems  Constitutional: Negative for activity change, chills, fever and unexpected weight change.  HENT: Negative for trouble swallowing and voice change.   Respiratory: Positive for cough, shortness of breath and wheezing. Negative for chest tightness.   Cardiovascular: Positive for palpitations. Negative for chest pain and leg swelling.  Gastrointestinal: Negative for abdominal distention and abdominal pain.  Genitourinary: Negative for difficulty urinating and dysuria.  Musculoskeletal: Positive for arthralgias and joint swelling.  Skin:       Subcutaneous nodule right neck  Hematological: Negative for adenopathy. Bruises/bleeds easily.  All other systems reviewed and are negative.   BP (!) 160/95   Pulse 80   Resp 20   Ht 6' (1.829 m)   Wt 176 lb (79.8 kg)   SpO2 97% Comment: RA  BMI 23.87 kg/m  Physical Exam  Constitutional: He is oriented to person, place, and time. He appears well-developed and well-nourished. No distress.  HENT:  Head: Normocephalic and atraumatic.  Mouth/Throat: No oropharyngeal exudate.  Eyes: Conjunctivae and EOM are normal. No scleral icterus.  Neck: Neck supple. No thyromegaly present.  Well-circumscribed mobile subcutaneous nodule right lateral neck  Pulmonary/Chest: No respiratory distress. He has wheezes (Bilateral). He has no rales.  Increased work of breathing, frequent cough  Abdominal: Soft. He exhibits no distension. There is no tenderness.  Musculoskeletal: He exhibits no edema or deformity.  Lymphadenopathy:    He has no cervical adenopathy.  Neurological: He is alert and oriented to person, place, and time. No cranial nerve deficit. He exhibits normal muscle tone.  Skin: Skin is warm and dry.  Positive mild clubbing and cyanosis  Vitals reviewed.    Diagnostic Tests: NUCLEAR MEDICINE PET SKULL BASE TO  THIGH  TECHNIQUE: 10.9 mCi F-18 FDG was injected intravenously. Full-ring PET imaging was performed from the skull base to thigh after the radiotracer. CT data was obtained and used for attenuation correction and anatomic localization.  FASTING BLOOD GLUCOSE:  Value: 90 mg/dl  COMPARISON:  08/23/2017  FINDINGS: NECK: No hypermetabolic lymph nodes in the neck.  CHEST: No hypermetabolic supraclavicular or axillary lymph nodes. No hypermetabolic mediastinal or hilar lymph nodes.  No pleural effusion. Mild changes of emphysema. The pulmonary nodule in the left upper lobe measures 1.3 cm and has an SUV max equal to 2.37.  Within the right lower lobe there is a perihilar nodule measuring 7 mm. SUV max associated with this nodule is equal to 1.14.  ABDOMEN/PELVIS: No abnormal hypermetabolic activity within the liver, pancreas, adrenal glands, or spleen. Aortic atherosclerosis. No hypermetabolic lymph nodes in the abdomen or pelvis.  SKELETON: No focal hypermetabolic activity to suggest skeletal metastasis.  IMPRESSION: 1. Persistent left upper lobe pulmonary nodule measuring 1.3 cm exhibits mild FDG uptake with an SUV max equal to 2.37. Cannot rule out small pulmonary neoplasm. No specific findings identified to suggest hypermetabolic hilar or mediastinal lymph node metastasis or distant metastatic disease. 2. 7 mm perihilar nodule in the right lower lobe exhibits minimal FDG uptake, but is really too small to reliably characterize by PET-CT.   Electronically Signed   By: Kerby Moors M.D.   On: 09/11/2017 16:10 I personally reviewed the CT and PET/CT images and concur with the findings noted above.  Impression: Derek [name] is a 57 year old man with a history of tobacco abuse and COPD who was found to have bilateral lung nodules.  There is a 1.2 cm nodule in the left upper lobe centrally and a 6-7 mm nodule in the superior segment of the right lower lobe.  The  left upper lobe nodule was hypermetabolic on PET CT, there also appears to be increased activity in the region of the right lower lobe nodule although radiology did not feel that was definitive.  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  Differential diagnosis includes synchronous primary bronchogenic carcinomas, primary lung carcinoma with contralateral metastasis, metastasis from other site (no evidence on PET), as well as infectious or inflammatory nodules.  The larger nodule on the left side is not amenable to wedge resection.  It is central and would require a segmentectomy.  I do not think he is a good surgical candidate given his ongoing wheezing and ongoing tobacco abuse.  I do think he would be a good candidate for stereotactic radiation.  I recommended him that we attempt a biopsy of the lesions.  With navigational bronchoscopy we can biopsy both sides and also place fiducial markers to assist with stereotactic radiation.  I described the general nature of the operation to Derek [name] and his daughter.  They understand this is an endoscopic procedure done under general anesthesia.  They understand the risks include those associated with general anesthesia.  They understand the risk of the procedure itself include but not limited to failure to make a diagnosis, pneumothorax, bleeding, as well as possibility of other unforeseeable complications.  He accepts the risks and agrees to proceed  Plan: Electromagnetic navigational bronchoscopy with fiducial placement on Friday, 11/02/2017  [name], MD [facility] [phone]

## 2017-10-25 NOTE — H&P (View-Only) (Signed)
PCP is Soyla Dryer, PA-C Referring Provider is Ribakive, Gregary Signs, MD  Chief Complaint  Patient presents with  . Lung Lesion    Surgical eval, PET Scan 09/11/17, Chest CT 08/23/17    HPI: Mr. Susman is a 57 year old gentleman with a history of tobacco, marijuana, and ethanol abuse.  He also has a history of hypertension, tuberculosis, COPD, glaucoma, and hepatitis C (treated).  Back in December he was having problems with shortness of breath and generalized weakness.  A CT of the chest demonstrated bilateral pulmonary nodules, 1.2 cm in the left upper lobe and 6-7 mm in the right lower lobe.  A PET/CT showed the left upper lobe nodule was hypermetabolic.  There was increased activity in the region of the right lower lobe nodule but given its small size it could not be confirmed.  He has a 60-pack-year history of smoking.  He says he currently is smoking less than a pack a day.  He gets short of breath both with exertion and sometimes at rest.  Sometimes he will get short of breath even with tying his shoes or taking a shower.  He has a chronic cough and wheezing.  He denies chest pain, pressure, or tightness.  He denies change in appetite or weight loss. Zubrod Score: At the time of surgery this patient's most appropriate activity status/level should be described as: []     0    Normal activity, no symptoms []     1    Restricted in physical strenuous activity but ambulatory, able to do out light work [x]     2    Ambulatory and capable of self care, unable to do work activities, up and about >50 % of waking hours                              []     3    Only limited self care, in bed greater than 50% of waking hours []     4    Completely disabled, no self care, confined to bed or chair []     5    Moribund  Past Medical History:  Diagnosis Date  . Asthma   . COPD (chronic obstructive pulmonary disease) ([place])   . Emphysema lung ([place])   . Glaucoma   . Hepatitis   . Hypertension   .  Substance abuse ([place])   . Tuberculosis     Past Surgical History:  Procedure Laterality Date  . BIOPSY  11/09/2015   Procedure: BIOPSY;  Surgeon: Danie Binder, MD;  [facility];  Service: Endoscopy;;  sigmoid colon polyps x3  . COLONOSCOPY WITH PROPOFOL N/A 11/09/2015   SLF:5 POLYPSREMOVED FROM RECTO-SIGMOID COLON/INTERNAL HEMORRHOIDS  . ESOPHAGOGASTRODUODENOSCOPY (EGD) WITH PROPOFOL N/A 11/09/2015   [id number]  . EYE SURGERY Right   . HERNIA REPAIR Left   . POLYPECTOMY  11/09/2015   Procedure: POLYPECTOMY;  Surgeon: Danie Binder, MD;  [facility];  Service: Endoscopy;;  rectal polyps x3( one not retrieved    Family History  Problem Relation Age of Onset  . Cancer Mother        lymph nodes  . Ulcers Father   . Cholecystitis Father   . Colon cancer Neg Hx   . Colon polyps Neg Hx     Social History Social History   Tobacco Use  . Smoking status: Current Every Day Smoker    Packs/day: 2.00  Years: 40.00    Pack years: 80.00    Types: Cigarettes  . Smokeless tobacco: Never Used  Substance Use Topics  . Alcohol use: Yes    Comment: hx alcoholism. sober since 04-2015  . Drug use: Yes    Types: Marijuana, Cocaine    Comment: Remote cocaine use, Current marjiuana use    Current Outpatient Medications  Medication Sig Dispense Refill  . albuterol (PROVENTIL HFA;VENTOLIN HFA) 108 (90 Base) MCG/ACT inhaler Inhale 2 puffs into the lungs every 6 (six) hours as needed for wheezing or shortness of breath. 3 Inhaler 1  . albuterol (PROVENTIL) (2.5 MG/3ML) 0.083% nebulizer solution Take 3 mLs (2.5 mg total) by nebulization every 6 (six) hours as needed for wheezing or shortness of breath. 150 mL 1  . Cyanocobalamin (VITAMIN B-12 PO) Take 1 tablet by mouth daily.    Marland Kitchen lisinopril (PRINIVIL,ZESTRIL) 10 MG tablet Take 1 tablet (10 mg total) by mouth daily. 90 tablet 1  . mometasone-formoterol (DULERA) 200-5 MCG/ACT AERO Inhale 2 puffs into the lungs 2 (two)  times daily. 3 Inhaler 1   No current facility-administered medications for this visit.     No Known Allergies  Review of Systems  Constitutional: Negative for activity change, chills, fever and unexpected weight change.  HENT: Negative for trouble swallowing and voice change.   Respiratory: Positive for cough, shortness of breath and wheezing. Negative for chest tightness.   Cardiovascular: Positive for palpitations. Negative for chest pain and leg swelling.  Gastrointestinal: Negative for abdominal distention and abdominal pain.  Genitourinary: Negative for difficulty urinating and dysuria.  Musculoskeletal: Positive for arthralgias and joint swelling.  Skin:       Subcutaneous nodule right neck  Hematological: Negative for adenopathy. Bruises/bleeds easily.  All other systems reviewed and are negative.   BP (!) 160/95   Pulse 80   Resp 20   Ht 6' (1.829 m)   Wt 176 lb (79.8 kg)   SpO2 97% Comment: RA  BMI 23.87 kg/m  Physical Exam  Constitutional: He is oriented to person, place, and time. He appears well-developed and well-nourished. No distress.  HENT:  Head: Normocephalic and atraumatic.  Mouth/Throat: No oropharyngeal exudate.  Eyes: Conjunctivae and EOM are normal. No scleral icterus.  Neck: Neck supple. No thyromegaly present.  Well-circumscribed mobile subcutaneous nodule right lateral neck  Pulmonary/Chest: No respiratory distress. He has wheezes (Bilateral). He has no rales.  Increased work of breathing, frequent cough  Abdominal: Soft. He exhibits no distension. There is no tenderness.  Musculoskeletal: He exhibits no edema or deformity.  Lymphadenopathy:    He has no cervical adenopathy.  Neurological: He is alert and oriented to person, place, and time. No cranial nerve deficit. He exhibits normal muscle tone.  Skin: Skin is warm and dry.  Positive mild clubbing and cyanosis  Vitals reviewed.    Diagnostic Tests: NUCLEAR MEDICINE PET SKULL BASE TO  THIGH  TECHNIQUE: 10.9 mCi F-18 FDG was injected intravenously. Full-ring PET imaging was performed from the skull base to thigh after the radiotracer. CT data was obtained and used for attenuation correction and anatomic localization.  FASTING BLOOD GLUCOSE:  Value: 90 mg/dl  COMPARISON:  08/23/2017  FINDINGS: NECK: No hypermetabolic lymph nodes in the neck.  CHEST: No hypermetabolic supraclavicular or axillary lymph nodes. No hypermetabolic mediastinal or hilar lymph nodes.  No pleural effusion. Mild changes of emphysema. The pulmonary nodule in the left upper lobe measures 1.3 cm and has an SUV max equal to 2.37.  Within the right lower lobe there is a perihilar nodule measuring 7 mm. SUV max associated with this nodule is equal to 1.14.  ABDOMEN/PELVIS: No abnormal hypermetabolic activity within the liver, pancreas, adrenal glands, or spleen. Aortic atherosclerosis. No hypermetabolic lymph nodes in the abdomen or pelvis.  SKELETON: No focal hypermetabolic activity to suggest skeletal metastasis.  IMPRESSION: 1. Persistent left upper lobe pulmonary nodule measuring 1.3 cm exhibits mild FDG uptake with an SUV max equal to 2.37. Cannot rule out small pulmonary neoplasm. No specific findings identified to suggest hypermetabolic hilar or mediastinal lymph node metastasis or distant metastatic disease. 2. 7 mm perihilar nodule in the right lower lobe exhibits minimal FDG uptake, but is really too small to reliably characterize by PET-CT.   Electronically Signed   By: Kerby Moors M.D.   On: 09/11/2017 16:10 I personally reviewed the CT and PET/CT images and concur with the findings noted above.  Impression: Mr. Lacek is a 57 year old man with a history of tobacco abuse and COPD who was found to have bilateral lung nodules.  There is a 1.2 cm nodule in the left upper lobe centrally and a 6-7 mm nodule in the superior segment of the right lower lobe.  The  left upper lobe nodule was hypermetabolic on PET CT, there also appears to be increased activity in the region of the right lower lobe nodule although radiology did not feel that was definitive.  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  Differential diagnosis includes synchronous primary bronchogenic carcinomas, primary lung carcinoma with contralateral metastasis, metastasis from other site (no evidence on PET), as well as infectious or inflammatory nodules.  The larger nodule on the left side is not amenable to wedge resection.  It is central and would require a segmentectomy.  I do not think he is a good surgical candidate given his ongoing wheezing and ongoing tobacco abuse.  I do think he would be a good candidate for stereotactic radiation.  I recommended him that we attempt a biopsy of the lesions.  With navigational bronchoscopy we can biopsy both sides and also place fiducial markers to assist with stereotactic radiation.  I described the general nature of the operation to Mr. Chinchilla and his daughter.  They understand this is an endoscopic procedure done under general anesthesia.  They understand the risks include those associated with general anesthesia.  They understand the risk of the procedure itself include but not limited to failure to make a diagnosis, pneumothorax, bleeding, as well as possibility of other unforeseeable complications.  He accepts the risks and agrees to proceed  Plan: Electromagnetic navigational bronchoscopy with fiducial placement on Friday, 11/02/2017  [name], MD [facility] [phone]

## 2017-10-26 ENCOUNTER — Other Ambulatory Visit: Payer: Self-pay | Admitting: *Deleted

## 2017-10-26 DIAGNOSIS — R918 Other nonspecific abnormal finding of lung field: Secondary | ICD-10-CM

## 2017-10-26 DIAGNOSIS — R911 Solitary pulmonary nodule: Secondary | ICD-10-CM

## 2017-10-30 NOTE — Pre-Procedure Instructions (Signed)
Derek [name]  10/30/2017      [place], [address] [place] [address] Phone: [phone] Fax: [phone]   [facility] - [place], [place] [address] Phone: [phone] Fax: [phone]    Your procedure is scheduled on November 02, 2017.  Report to [facility] Admitting at 730 AM.  Call this number if you have problems the morning of surgery:  [phone]   Remember:  Do not eat food or drink liquids after midnight.  Take these medicines the morning of surgery with A SIP OF WATER albuterol inhaler, dulera inhaler, (bring inhalers with you), albuterol nebulizer-if needed.  7 days prior to surgery STOP taking any Aspirin (unless otherwise instructed by your surgeon), Aleve, Naproxen, Ibuprofen, Motrin, Advil, Goody's, BC's, all herbal medications, fish oil, and all vitamins  Continue all other medications as instructed by your physician except follow the above medication instructions before surgery   Do not wear jewelry, make-up or nail polish.  Do not wear lotions, powders, or perfumes, or deodorant.  Men may shave face and neck.  Do not bring valuables to the hospital.  [facility] is not responsible for any belongings or valuables.  Contacts, dentures or bridgework may not be worn into surgery.  Leave your suitcase in the car.  After surgery it may be brought to your room.  For patients admitted to the hospital, discharge time will be determined by your treatment team.  Patients discharged the day of surgery will not be allowed to drive home.   Special instructions:  [place]- Preparing For Surgery  Before surgery, you can play an important role. Because skin is not sterile, your skin needs to be as free of germs as possible. You can reduce the number of germs on your skin by washing with CHG (chlorahexidine gluconate) Soap before surgery.  CHG is an  antiseptic cleaner which kills germs and bonds with the skin to continue killing germs even after washing.  Please do not use if you have an allergy to CHG or antibacterial soaps. If your skin becomes reddened/irritated stop using the CHG.  Do not shave (including legs and underarms) for at least 48 hours prior to first CHG shower. It is OK to shave your face.  Please follow these instructions carefully.   1. Shower the NIGHT BEFORE SURGERY and the MORNING OF SURGERY with CHG.   2. If you chose to wash your hair, wash your hair first as usual with your normal shampoo.  3. After you shampoo, rinse your hair and body thoroughly to remove the shampoo.  4. Use CHG as you would any other liquid soap. You can apply CHG directly to the skin and wash gently with a scrungie or a clean washcloth.   5. Apply the CHG Soap to your body ONLY FROM THE NECK DOWN.  Do not use on open wounds or open sores. Avoid contact with your eyes, ears, mouth and genitals (private parts). Wash Face and genitals (private parts)  with your normal soap.  6. Wash thoroughly, paying special attention to the area where your surgery will be performed.  7. Thoroughly rinse your body with warm water from the neck down.  8. DO NOT shower/wash with your normal soap after using and rinsing off the CHG Soap.  9. Pat yourself dry with a CLEAN TOWEL.  10. Wear CLEAN  PAJAMAS to bed the night before surgery, wear comfortable clothes the morning of surgery  11. Place CLEAN SHEETS on your bed the night of your first shower and DO NOT SLEEP WITH PETS.  Day of Surgery: Do not apply any deodorants/lotions. Please wear clean clothes to the hospital/surgery center.    Please read over the following fact sheets that you were given. Pain Booklet, Coughing and Deep Breathing and Surgical Site Infection Prevention

## 2017-10-31 ENCOUNTER — Inpatient Hospital Stay: Admission: RE | Admit: 2017-10-31 | Payer: Medicaid Other | Source: Ambulatory Visit

## 2017-10-31 ENCOUNTER — Other Ambulatory Visit: Payer: Self-pay

## 2017-10-31 ENCOUNTER — Encounter (HOSPITAL_COMMUNITY): Payer: Self-pay

## 2017-10-31 ENCOUNTER — Encounter (HOSPITAL_COMMUNITY)
Admission: RE | Admit: 2017-10-31 | Discharge: 2017-10-31 | Disposition: A | Discharge status: Home / Self Care | Payer: Medicaid Other | Source: Ambulatory Visit | Attending: Thoracic Surgery (Cardiothoracic Vascular Surgery) | Admitting: Thoracic Surgery (Cardiothoracic Vascular Surgery)

## 2017-10-31 ENCOUNTER — Ambulatory Visit (HOSPITAL_COMMUNITY)
Admission: RE | Admit: 2017-10-31 | Discharge: 2017-10-31 | Disposition: A | Discharge status: Home / Self Care | Payer: Medicaid Other | Source: Ambulatory Visit | Attending: Thoracic Surgery (Cardiothoracic Vascular Surgery) | Admitting: Thoracic Surgery (Cardiothoracic Vascular Surgery)

## 2017-10-31 DIAGNOSIS — Z01818 Encounter for other preprocedural examination: Secondary | ICD-10-CM | POA: Diagnosis present

## 2017-10-31 DIAGNOSIS — R918 Other nonspecific abnormal finding of lung field: Secondary | ICD-10-CM | POA: Diagnosis not present

## 2017-10-31 DIAGNOSIS — Z01810 Encounter for preprocedural cardiovascular examination: Secondary | ICD-10-CM | POA: Insufficient documentation

## 2017-10-31 DIAGNOSIS — J449 Chronic obstructive pulmonary disease, unspecified: Secondary | ICD-10-CM | POA: Diagnosis not present

## 2017-10-31 DIAGNOSIS — Z01812 Encounter for preprocedural laboratory examination: Secondary | ICD-10-CM | POA: Diagnosis present

## 2017-10-31 HISTORY — DX: Unspecified osteoarthritis, unspecified site: M19.90

## 2017-10-31 HISTORY — DX: Other nonspecific abnormal finding of lung field: R91.8

## 2017-10-31 LAB — COMPREHENSIVE METABOLIC PANEL
ALT: 20 U/L (ref 17–63)
ANION GAP: 10 (ref 5–15)
AST: 25 U/L (ref 15–41)
Albumin: 3.9 g/dL (ref 3.5–5.0)
Alkaline Phosphatase: 106 U/L (ref 38–126)
BILIRUBIN TOTAL: 0.5 mg/dL (ref 0.3–1.2)
BUN: 10 mg/dL (ref 6–20)
CHLORIDE: 104 mmol/L (ref 101–111)
CO2: 26 mmol/L (ref 22–32)
Calcium: 9.8 mg/dL (ref 8.9–10.3)
Creatinine, Ser: 0.78 mg/dL (ref 0.61–1.24)
Glucose, Bld: 100 mg/dL — ABNORMAL HIGH (ref 65–99)
POTASSIUM: 4.6 mmol/L (ref 3.5–5.1)
Sodium: 140 mmol/L (ref 135–145)
TOTAL PROTEIN: 7.4 g/dL (ref 6.5–8.1)

## 2017-10-31 LAB — CBC
HEMATOCRIT: 46.7 % (ref 39.0–52.0)
Hemoglobin: 15.9 g/dL (ref 13.0–17.0)
MCH: 35.2 pg — AB (ref 26.0–34.0)
MCHC: 34 g/dL (ref 30.0–36.0)
MCV: 103.3 fL — ABNORMAL HIGH (ref 78.0–100.0)
PLATELETS: 283 10*3/uL (ref 150–400)
RBC: 4.52 MIL/uL (ref 4.22–5.81)
RDW: 13.7 % (ref 11.5–15.5)
WBC: 12 10*3/uL — AB (ref 4.0–10.5)

## 2017-10-31 LAB — APTT: aPTT: 31 seconds (ref 24–36)

## 2017-10-31 LAB — PROTIME-INR
INR: 1.04
PROTHROMBIN TIME: 13.5 s (ref 11.4–15.2)

## 2017-10-31 NOTE — Progress Notes (Signed)
PCP - PA Soyla Dryer  Cardiologist - Denies  Chest x-ray - 10/31/17  EKG - 10/31/17  Stress Test - Denies  ECHO - Denies  Cardiac Cath - Denies  Sleep Study - No CPAP - None  LABS- 10/31/17: CBC, CMP, PT, PTT  Anesthesia- No  Pt denies having chest pain, sob, or fever at this time. All instructions explained to the pt, with a verbal understanding of the material. Pt agrees to go over the instructions while at home for a better understanding. The opportunity to ask questions was provided.

## 2017-11-02 ENCOUNTER — Other Ambulatory Visit: Payer: Medicaid Other

## 2017-11-02 ENCOUNTER — Other Ambulatory Visit: Payer: Self-pay | Admitting: *Deleted

## 2017-11-02 DIAGNOSIS — R918 Other nonspecific abnormal finding of lung field: Secondary | ICD-10-CM

## 2017-11-07 ENCOUNTER — Ambulatory Visit
Admission: RE | Admit: 2017-11-07 | Discharge: 2017-11-07 | Disposition: A | Discharge status: Home / Self Care | Payer: Medicaid Other | Source: Ambulatory Visit | Attending: Thoracic Surgery (Cardiothoracic Vascular Surgery) | Admitting: Thoracic Surgery (Cardiothoracic Vascular Surgery)

## 2017-11-07 DIAGNOSIS — R918 Other nonspecific abnormal finding of lung field: Secondary | ICD-10-CM

## 2017-11-09 ENCOUNTER — Ambulatory Visit (HOSPITAL_COMMUNITY): Payer: Medicaid Other | Admitting: Certified Registered Nurse Anesthetist

## 2017-11-09 ENCOUNTER — Ambulatory Visit (HOSPITAL_COMMUNITY)
Admission: RE | Admit: 2017-11-09 | Discharge: 2017-11-09 | Disposition: A | Discharge status: Home / Self Care | Payer: Medicaid Other | Source: Ambulatory Visit | Attending: Thoracic Surgery (Cardiothoracic Vascular Surgery) | Admitting: Thoracic Surgery (Cardiothoracic Vascular Surgery)

## 2017-11-09 ENCOUNTER — Ambulatory Visit (HOSPITAL_COMMUNITY): Payer: Medicaid Other

## 2017-11-09 ENCOUNTER — Encounter (HOSPITAL_COMMUNITY)
Admission: RE | Disposition: A | Discharge status: Home / Self Care | Payer: Self-pay | Source: Ambulatory Visit | Attending: Thoracic Surgery (Cardiothoracic Vascular Surgery)

## 2017-11-09 DIAGNOSIS — Z8611 Personal history of tuberculosis: Secondary | ICD-10-CM | POA: Insufficient documentation

## 2017-11-09 DIAGNOSIS — F17210 Nicotine dependence, cigarettes, uncomplicated: Secondary | ICD-10-CM | POA: Diagnosis not present

## 2017-11-09 DIAGNOSIS — J60 Coalworker's pneumoconiosis: Secondary | ICD-10-CM | POA: Insufficient documentation

## 2017-11-09 DIAGNOSIS — Z79899 Other long term (current) drug therapy: Secondary | ICD-10-CM | POA: Insufficient documentation

## 2017-11-09 DIAGNOSIS — R911 Solitary pulmonary nodule: Secondary | ICD-10-CM | POA: Diagnosis not present

## 2017-11-09 DIAGNOSIS — Z419 Encounter for procedure for purposes other than remedying health state, unspecified: Secondary | ICD-10-CM

## 2017-11-09 DIAGNOSIS — Z8619 Personal history of other infectious and parasitic diseases: Secondary | ICD-10-CM | POA: Diagnosis not present

## 2017-11-09 DIAGNOSIS — I10 Essential (primary) hypertension: Secondary | ICD-10-CM | POA: Diagnosis not present

## 2017-11-09 DIAGNOSIS — R918 Other nonspecific abnormal finding of lung field: Secondary | ICD-10-CM

## 2017-11-09 DIAGNOSIS — J439 Emphysema, unspecified: Secondary | ICD-10-CM | POA: Insufficient documentation

## 2017-11-09 DIAGNOSIS — Z01818 Encounter for other preprocedural examination: Secondary | ICD-10-CM

## 2017-11-09 HISTORY — PX: FUDUCIAL PLACEMENT: SHX5083

## 2017-11-09 HISTORY — PX: VIDEO BRONCHOSCOPY WITH ENDOBRONCHIAL NAVIGATION: SHX6175

## 2017-11-09 SURGERY — VIDEO BRONCHOSCOPY WITH ENDOBRONCHIAL NAVIGATION
Anesthesia: General

## 2017-11-09 MED ORDER — DEXAMETHASONE SODIUM PHOSPHATE 10 MG/ML IJ SOLN
INTRAMUSCULAR | Status: DC | PRN
  Administered 2017-11-09: 10 mg via INTRAVENOUS

## 2017-11-09 MED ORDER — ONDANSETRON HCL 4 MG/2ML IJ SOLN
INTRAMUSCULAR | Status: DC | PRN
  Administered 2017-11-09: 4 mg via INTRAVENOUS

## 2017-11-09 MED ORDER — MIDAZOLAM HCL 2 MG/2ML IJ SOLN
INTRAMUSCULAR | Status: AC
  Filled 2017-11-09: qty 2

## 2017-11-09 MED ORDER — OXYCODONE HCL 5 MG PO TABS: 5.0000 mg | ORAL_TABLET | Freq: Once | ORAL | Status: DC | PRN

## 2017-11-09 MED ORDER — ALBUTEROL SULFATE HFA 108 (90 BASE) MCG/ACT IN AERS
INHALATION_SPRAY | RESPIRATORY_TRACT | Status: DC | PRN
  Administered 2017-11-09: 4 via RESPIRATORY_TRACT

## 2017-11-09 MED ORDER — PROPOFOL 10 MG/ML IV BOLUS
INTRAVENOUS | Status: AC
  Filled 2017-11-09: qty 20

## 2017-11-09 MED ORDER — PROPOFOL 10 MG/ML IV BOLUS
INTRAVENOUS | Status: DC | PRN
  Administered 2017-11-09: 170 mg via INTRAVENOUS

## 2017-11-09 MED ORDER — FENTANYL CITRATE (PF) 250 MCG/5ML IJ SOLN
INTRAMUSCULAR | Status: DC | PRN
  Administered 2017-11-09 (×2): 50 ug via INTRAVENOUS
  Administered 2017-11-09: 100 ug via INTRAVENOUS

## 2017-11-09 MED ORDER — EPINEPHRINE PF 1 MG/ML IJ SOLN
INTRAMUSCULAR | Status: AC
  Filled 2017-11-09: qty 1

## 2017-11-09 MED ORDER — LIDOCAINE 2% (20 MG/ML) 5 ML SYRINGE
INTRAMUSCULAR | Status: DC | PRN
  Administered 2017-11-09: 60 mg via INTRAVENOUS

## 2017-11-09 MED ORDER — OXYCODONE HCL 5 MG/5ML PO SOLN: 5.0000 mg | Freq: Once | ORAL | Status: DC | PRN

## 2017-11-09 MED ORDER — SUGAMMADEX SODIUM 200 MG/2ML IV SOLN
INTRAVENOUS | Status: DC | PRN
  Administered 2017-11-09: 200 mg via INTRAVENOUS

## 2017-11-09 MED ORDER — FENTANYL CITRATE (PF) 250 MCG/5ML IJ SOLN
INTRAMUSCULAR | Status: AC
  Filled 2017-11-09: qty 5

## 2017-11-09 MED ORDER — MIDAZOLAM HCL 2 MG/2ML IJ SOLN
INTRAMUSCULAR | Status: DC | PRN
  Administered 2017-11-09: 2 mg via INTRAVENOUS

## 2017-11-09 MED ORDER — ONDANSETRON HCL 4 MG/2ML IJ SOLN: 4.0000 mg | Freq: Four times a day (QID) | INTRAMUSCULAR | Status: DC | PRN

## 2017-11-09 MED ORDER — ROCURONIUM BROMIDE 10 MG/ML (PF) SYRINGE
PREFILLED_SYRINGE | INTRAVENOUS | Status: DC | PRN
  Administered 2017-11-09 (×2): 20 mg via INTRAVENOUS
  Administered 2017-11-09: 40 mg via INTRAVENOUS
  Administered 2017-11-09: 20 mg via INTRAVENOUS

## 2017-11-09 MED ORDER — 0.9 % SODIUM CHLORIDE (POUR BTL) OPTIME
TOPICAL | Status: DC | PRN
  Administered 2017-11-09: 1000 mL

## 2017-11-09 MED ORDER — FENTANYL CITRATE (PF) 100 MCG/2ML IJ SOLN: 25.0000 ug | INTRAMUSCULAR | Status: DC | PRN

## 2017-11-09 MED ORDER — LACTATED RINGERS IV SOLN
INTRAVENOUS | Status: DC | PRN
  Administered 2017-11-09 (×2): via INTRAVENOUS

## 2017-11-09 SURGICAL SUPPLY — 44 items
ADAPTER BRONCH F/PENTAX (ADAPTER) ×3 IMPLANT
ADPR BSCP EDG PNTX (ADAPTER) ×1
BRUSH BIOPSY BRONCH 10 SDTNB (MISCELLANEOUS) ×2 IMPLANT
BRUSH BIOPSY BRONCH 10MM SDTNB (MISCELLANEOUS) ×2
BRUSH SUPERTRAX BIOPSY (INSTRUMENTS) IMPLANT
BRUSH SUPERTRAX NDL-TIP CYTO (INSTRUMENTS) ×3 IMPLANT
CANISTER SUCT 3000ML PPV (MISCELLANEOUS) ×3 IMPLANT
CHANNEL WORK EXTEND EDGE 180 (KITS) IMPLANT
CHANNEL WORK EXTEND EDGE 45 (KITS) IMPLANT
CHANNEL WORK EXTEND EDGE 90 (KITS) IMPLANT
CONT SPEC 4OZ CLIKSEAL STRL BL (MISCELLANEOUS) ×6 IMPLANT
CONT SPECI 4OZ STER CLIK (MISCELLANEOUS) ×4 IMPLANT
COVER BACK TABLE 60X90IN (DRAPES) ×3 IMPLANT
FILTER STRAW FLUID ASPIR (MISCELLANEOUS) IMPLANT
FORCEPS BIOP SUPERTRX PREMAR (INSTRUMENTS) ×2 IMPLANT
GAUZE SPONGE 4X4 12PLY STRL (GAUZE/BANDAGES/DRESSINGS) ×3 IMPLANT
GLOVE SURG SIGNA 7.5 PF LTX (GLOVE) ×3 IMPLANT
GOWN STRL REUS W/ TWL XL LVL3 (GOWN DISPOSABLE) ×1 IMPLANT
GOWN STRL REUS W/TWL XL LVL3 (GOWN DISPOSABLE) ×3
KIT CLEAN ENDO COMPLIANCE (KITS) ×3 IMPLANT
KIT PROCEDURE EDGE 180 (KITS) ×2 IMPLANT
KIT PROCEDURE EDGE 45 (KITS) IMPLANT
KIT PROCEDURE EDGE 90 (KITS) IMPLANT
KIT ROOM TURNOVER OR (KITS) ×3 IMPLANT
MARKER FIDUCIAL SL NIT COIL (Implant Marker) ×14 IMPLANT
MARKER SKIN DUAL TIP RULER LAB (MISCELLANEOUS) ×3 IMPLANT
NDL SUPERTRX PREMARK BIOPSY (NEEDLE) IMPLANT
NEEDLE SUPERTRX PREMARK BIOPSY (NEEDLE) IMPLANT
NS IRRIG 1000ML POUR BTL (IV SOLUTION) ×3 IMPLANT
OIL SILICONE PENTAX (PARTS (SERVICE/REPAIRS)) ×3 IMPLANT
PAD ARMBOARD 7.5X6 YLW CONV (MISCELLANEOUS) ×6 IMPLANT
PATCHES PATIENT (LABEL) ×9 IMPLANT
SYR 20CC LL (SYRINGE) ×3 IMPLANT
SYR 20ML ECCENTRIC (SYRINGE) ×3 IMPLANT
SYR 30ML LL (SYRINGE) ×3 IMPLANT
SYR 5ML LL (SYRINGE) ×3 IMPLANT
TOWEL GREEN STERILE (TOWEL DISPOSABLE) ×3 IMPLANT
TOWEL GREEN STERILE FF (TOWEL DISPOSABLE) ×3 IMPLANT
TRAP SPECIMEN MUCOUS 40CC (MISCELLANEOUS) ×3 IMPLANT
TUBE CONNECTING 20'X1/4 (TUBING) ×2
TUBE CONNECTING 20X1/4 (TUBING) ×4 IMPLANT
UNDERPAD 30X30 (UNDERPADS AND DIAPERS) ×3 IMPLANT
VALVE DISPOSABLE (MISCELLANEOUS) ×3 IMPLANT
WATER STERILE IRR 1000ML POUR (IV SOLUTION) ×3 IMPLANT

## 2017-11-09 NOTE — Transfer of Care (Signed)
Immediate Anesthesia Transfer of Care Note  Patient: Derek [name]  Procedure(s) Performed: VIDEO BRONCHOSCOPY WITH ENDOBRONCHIAL NAVIGATION (N/A ) PLACEMENT OF FUDUCIALS IN LEFT UPPER LOBE AND RIGHT LOWER LOBE LUNG (N/A )  Patient Location: PACU  Anesthesia Type:General  Level of Consciousness: awake, alert  and oriented  Airway & Oxygen Therapy: Patient Spontanous Breathing and Patient connected to face mask oxygen  Post-op Assessment: Report given to RN and Post -op Vital signs reviewed and stable  Post vital signs: Reviewed and stable  Last Vitals:  Vitals:   11/09/17 0539  BP: (!) 179/86  Pulse: 75  Resp: 18  Temp: 36.6 C  SpO2: 97%    Last Pain:  Vitals:   11/09/17 0539  TempSrc: Oral      Patients Stated Pain Goal: 3 (33/61/22 4497)  Complications: No apparent anesthesia complications

## 2017-11-09 NOTE — Discharge Instructions (Addendum)
Do not drive or engage in heavy physical activity for 24 hours  You may resume normal activities tomorrow  You may cough up small amounts of blood over the next few days.  You may use an over the counter cough medication if needed. You may use acetaminophen (Tylenol) if needed for discomfort  Call [phone] if you develop chest pain, shortness of breath, fever > 101 F or cough up more than 2 tablespoons of blood  My office will contact you with a follow up appointment

## 2017-11-09 NOTE — Interval H&P Note (Signed)
History and Physical Interval Note:  11/09/2017 7:12 AM  Derek [name]  has presented today for surgery, with the diagnosis of bilateral lung nodules  The various methods of treatment have been discussed with the patient and family. After consideration of risks, benefits and other options for treatment, the patient has consented to  Procedure(s): [place] (N/A) PLACEMENT OF FUDUCIAL (N/A) as a surgical intervention .  The patient's history has been reviewed, patient examined, no change in status, stable for surgery.  I have reviewed the patient's chart and labs.  Questions were answered to the patient's satisfaction.     [name]

## 2017-11-09 NOTE — Anesthesia Preprocedure Evaluation (Signed)
Anesthesia Evaluation  Patient identified by MRN, date of birth, ID band Patient awake    Reviewed: Allergy & Precautions, H&P , NPO status , Patient's Chart, lab work & pertinent test results  Airway Mallampati: II   Neck ROM: full    Dental   Pulmonary asthma , COPD, Current Smoker,    breath sounds clear to auscultation       Cardiovascular hypertension,  Rhythm:regular Rate:Normal     Neuro/Psych    GI/Hepatic GERD  ,(+)     substance abuse  cocaine use and marijuana use, Hepatitis -, C  Endo/Other    Renal/GU      Musculoskeletal  (+) Arthritis ,   Abdominal   Peds  Hematology   Anesthesia Other Findings   Reproductive/Obstetrics                             Anesthesia Physical Anesthesia Plan  ASA: III  Anesthesia Plan: General   Post-op Pain Management:    Induction: Intravenous  PONV Risk Score and Plan: 1 and Ondansetron, Dexamethasone, Midazolam and Treatment may vary due to age or medical condition  Airway Management Planned: Oral ETT  Additional Equipment:   Intra-op Plan:   Post-operative Plan: Extubation in OR  Informed Consent: I have reviewed the patients History and Physical, chart, labs and discussed the procedure including the risks, benefits and alternatives for the proposed anesthesia with the patient or authorized representative who has indicated his/her understanding and acceptance.     Plan Discussed with: CRNA, Anesthesiologist and Surgeon  Anesthesia Plan Comments:         Anesthesia Quick Evaluation

## 2017-11-09 NOTE — Anesthesia Procedure Notes (Signed)
Procedure Name: Intubation Date/Time: 11/09/2017 7:37 AM Performed by: Bryson Corona, CRNA Pre-anesthesia Checklist: Patient identified, Emergency Drugs available, Suction available and Patient being monitored Patient Re-evaluated:Patient Re-evaluated prior to induction Oxygen Delivery Method: Circle System Utilized Preoxygenation: Pre-oxygenation with 100% oxygen Induction Type: IV induction Ventilation: Mask ventilation without difficulty Laryngoscope Size: Mac and 4 Grade View: Grade I Tube type: Oral Tube size: 8.5 mm Number of attempts: 1 Airway Equipment and Method: Stylet and Oral airway Placement Confirmation: ETT inserted through vocal cords under direct vision,  positive ETCO2 and breath sounds checked- equal and bilateral Secured at: 22 cm Tube secured with: Tape Dental Injury: Teeth and Oropharynx as per pre-operative assessment

## 2017-11-09 NOTE — Brief Op Note (Signed)
11/09/2017  9:50 AM  PATIENT:  Derek [name]  57 y.o. male  PRE-OPERATIVE DIAGNOSIS:  bilateral lung nodules  POST-OPERATIVE DIAGNOSIS:  bilateral lung nodules  PROCEDURE:  Procedure(s): VIDEO BRONCHOSCOPY WITH ENDOBRONCHIAL NAVIGATION (N/A) PLACEMENT OF FUDUCIALS IN LEFT UPPER LOBE AND RIGHT LOWER LOBE LUNG (N/A) Needle aspirations, brushings and transbronchial biopsies  SURGEON:  Surgeon(s) and Role:    * [name], MD - Primary  PHYSICIAN ASSISTANT:   ASSISTANTS: none   ANESTHESIA:   general  EBL:  5 mL   BLOOD ADMINISTERED:none  DRAINS: none   LOCAL MEDICATIONS USED:  NONE  SPECIMEN:  Source of Specimen:  LUL, RLL nodules  DISPOSITION OF SPECIMEN:  Path and micro  COUNTS:  NO endoscsopic  TOURNIQUET:  * No tourniquets in log *  DICTATION: .Other Dictation: Dictation Number -  PLAN OF CARE: Discharge to home after PACU  PATIENT DISPOSITION:  PACU - hemodynamically stable.   Delay start of Pharmacological VTE agent (>24hrs) due to surgical blood loss or risk of bleeding: not applicable

## 2017-11-10 NOTE — Anesthesia Postprocedure Evaluation (Signed)
Anesthesia Post Note  Patient: Derek [name]  Procedure(s) Performed: VIDEO BRONCHOSCOPY WITH ENDOBRONCHIAL NAVIGATION (N/A ) PLACEMENT OF FUDUCIALS IN LEFT UPPER LOBE AND RIGHT LOWER LOBE LUNG (N/A )     Patient location during evaluation: PACU Anesthesia Type: General Level of consciousness: awake and alert Pain management: pain level controlled Vital Signs Assessment: post-procedure vital signs reviewed and stable Respiratory status: spontaneous breathing, nonlabored ventilation, respiratory function stable and patient connected to nasal cannula oxygen Cardiovascular status: blood pressure returned to baseline and stable Postop Assessment: no apparent nausea or vomiting Anesthetic complications: no    Last Vitals:  Vitals:   11/09/17 1030 11/09/17 1037  BP:  (!) 156/89  Pulse: 75 81  Resp: 14   Temp: 36.9 C   SpO2: 95% 95%    Last Pain:  Vitals:   11/09/17 1030  TempSrc:   PainSc: 0-No pain   Pain Goal: Patients Stated Pain Goal: 3 (11/09/17 4497)               [name] S

## 2017-11-11 LAB — CULTURE, RESPIRATORY

## 2017-11-11 LAB — CULTURE, RESPIRATORY W GRAM STAIN

## 2017-11-12 ENCOUNTER — Encounter (HOSPITAL_COMMUNITY): Payer: Self-pay | Admitting: Thoracic Surgery (Cardiothoracic Vascular Surgery)

## 2017-11-13 LAB — ACID FAST SMEAR (AFB)
ACID FAST SMEAR - AFSCU2: NEGATIVE
ACID FAST SMEAR - AFSCU2: NEGATIVE

## 2017-11-13 LAB — ACID FAST SMEAR (AFB, MYCOBACTERIA)

## 2017-11-13 NOTE — Op Note (Signed)
NAMENOHA, KARASIK.:  [id number]  MEDICAL RECORD NO.:  [id number]  LOCATION:                                 FACILITY:  PHYSICIAN:  Revonda Standard. Roxan Hockey, M.D.DATE OF BIRTH:  [birth year]  DATE OF PROCEDURE:  11/09/2017 DATE OF DISCHARGE:  11/09/2017                              OPERATIVE REPORT   PREOPERATIVE DIAGNOSIS:  Bilateral lung nodules.  POSTOPERATIVE DIAGNOSIS:  Bilateral lung nodules.  PROCEDURE: 1. Electromagnetic navigational bronchoscopy with needle aspirations,     brushings, and transbronchial biopsies. 2. Placement of fiducials in left upper and right lower lobe.  SURGEON:  Revonda Standard. Roxan Hockey, M.D.  ASSISTANT:  None.  ANESTHESIA:  General.  FINDINGS:  Able to navigate into close proximity to both nodules.  No malignancy seen on quick preps, although there was suggestion of possible granulomas.  CLINICAL NOTE:  Mr. Swindle is a 57 year old gentleman with a history of tobacco abuse, hypertension, tuberculosis, COPD, glaucoma, and hepatitis C.  He was found to have lung nodules back in December when a CT of the chest showed a 1.2 cm left upper lobe nodule and a 6 mm right lower lobe nodule.  On PET, the left upper lobe nodule was hypermetabolic.  The right lower lobe nodule was equivocal.  He was not felt to be a good surgical candidate because of significant COPD.  He was advised to undergo biopsy of the lesions and fiducial placement with navigational bronchoscopy.  The indications, risks, benefits, and alternatives were discussed in detail with the patient.  He understood and accepted the risks and agreed to proceed.  OPERATIVE NOTE:  Mr. Furukawa was brought to the operating room on November 09, 2017.  He had induction of general anesthesia and was intubated. After performing a time-out, flexible fiberoptic bronchoscopy was performed via the endotracheal tube.  It revealed normal endobronchial anatomy with no endobronchial  lesions to the level of subsegmental bronchi.  The locatable guide for navigation was placed, and registration was performed.  There was good correlation of the video and virtual bronchoscopy.  The bronchoscope was directed to the left upper lobe bronchus, and the appropriate subsegmental bronchus was cannulated with locatable guide.  It was navigated to within a centimeter of the nodule with good alignment.  Needle aspirations and needle brushings were performed.  All sampling was done with fluoroscopic guidance. These specimens were sent for quick prep.  Multiple biopsies were obtained for permanent pathology.  After obtaining the biopsies, registration for fiducial placement was performed, and then 3 fiducials were placed at sites as marked by the computer.  Location of fiducials was confirmed with fluoroscopy.  The scope then was directed to the right lower lobe bronchus, and the superior segmental bronchus was cannulated.  The locatable guide again was advanced into close proximity with good alignment to the right lower lobe nodule.  The sampling process was repeated with needle aspirations and needle brushings followed by biopsies, and then fiducial placement was performed at this lesion as well.  By this point, the initial specimens from the left side had come back.  No tumor was seen. However, there was suspicion for possible granulomatous disease.  The  bronchoscope was navigated back to the left upper lobe, and additional biopsies were taken and sent for AFB and fungal cultures.  A final inspection was made.  There was no ongoing bleeding.  The bronchoscope was removed.  The patient was extubated in the operating room and taken to postanesthetic care unit in good condition.     Revonda Standard Roxan Hockey, M.D.     SCH/MEDQ  D:  11/13/2017  T:  11/13/2017  Job:  [id number]

## 2017-11-14 ENCOUNTER — Other Ambulatory Visit: Payer: Medicaid Other

## 2017-11-16 ENCOUNTER — Other Ambulatory Visit: Payer: Self-pay

## 2017-11-16 ENCOUNTER — Encounter: Payer: Self-pay | Admitting: Thoracic Surgery (Cardiothoracic Vascular Surgery)

## 2017-11-16 ENCOUNTER — Ambulatory Visit: Payer: Medicaid Other | Admitting: Thoracic Surgery (Cardiothoracic Vascular Surgery)

## 2017-11-16 VITALS — BP 118/82 | HR 99 | Resp 18 | Ht 72.0 in | Wt 175.4 lb

## 2017-11-16 DIAGNOSIS — R911 Solitary pulmonary nodule: Secondary | ICD-10-CM | POA: Diagnosis not present

## 2017-11-16 NOTE — Progress Notes (Signed)
[address]       [place] [address]             [phone]      HPI: Derek [name] returns to discuss the results of his bronchoscopy  Stress worse a 57 year old man with a history of tobacco abuse.  He also has history of hypertension, tuberculosis, COPD, glaucoma, and treated hepatitis C.  He has been found to have bilateral lung nodules with a 1.2 cm left upper lobe nodule and a 7 mm right lower lobe nodule.  These were both hypermetabolic on PET/CT.  He had a navigational bronchoscopy for biopsies last week and now returns to discuss the results.  On intraoperative assessment there was a question of giant cell formation and possible granulomas, but on permanent pathology all of the specimens were nondiagnostic.  He coughed up a little blood initially after the procedure and then coughed up some old blood this morning.  Otherwise his breathing is better than it had been previously.  He is now off of prednisone.  Past Medical History:  Diagnosis Date  . Arthritis   . Asthma   . COPD (chronic obstructive pulmonary disease) ([place])   . Emphysema lung ([place])   . Glaucoma   . Hepatitis    C  . Hypertension   . Lung nodules    Bilateral  . Substance abuse ([place])   . Tuberculosis     Current Outpatient Medications  Medication Sig Dispense Refill  . albuterol (PROVENTIL HFA;VENTOLIN HFA) 108 (90 Base) MCG/ACT inhaler Inhale 2 puffs into the lungs every 6 (six) hours as needed for wheezing or shortness of breath. 3 Inhaler 1  . albuterol (PROVENTIL) (2.5 MG/3ML) 0.083% nebulizer solution Take 3 mLs (2.5 mg total) by nebulization every 6 (six) hours as needed for wheezing or shortness of breath. 150 mL 1  . CHANTIX 1 MG tablet Take 1 mg by mouth 2 (two) times daily.  0  . Cyanocobalamin (VITAMIN B-12 PO) Take 1 tablet by mouth daily.    Marland Kitchen ipratropium-albuterol (DUONEB) 0.5-2.5 (3) MG/3ML SOLN Inhale 3 mLs into the lungs 4 (four) times daily.  0  . lisinopril  (PRINIVIL,ZESTRIL) 10 MG tablet Take 1 tablet (10 mg total) by mouth daily. 90 tablet 1  . mometasone-formoterol (DULERA) 200-5 MCG/ACT AERO Inhale 2 puffs into the lungs 2 (two) times daily. (Patient taking differently: Inhale 2 puffs into the lungs 2 (two) times daily as needed for wheezing or shortness of breath. ) 3 Inhaler 1   No current facility-administered medications for this visit.     Physical Exam BP 118/82 (BP Location: Left Arm, Patient Position: Sitting, Cuff Size: Large)   Pulse 99   Resp 18   Ht 6' (1.829 m)   Wt 175 lb 6.4 oz (79.6 kg)   SpO2 97% Comment: RA  BMI 23.48 kg/m  57 year old man in no acute distress Alert and oriented x3 with no focal deficits Lungs with faint wheezing left greater than right  Diagnostic Tests: Path and cytology reports reviewed nondiagnostic  Impression: Derek [name] is a 56 year old man with bilateral lung nodules.  He has a history of tobacco abuse.  These nodules are hypermetabolic on PET/CT.  They are very concerning for synchronous non-small cell carcinomas.  Unfortunately biopsies were nondiagnostic.  I discussed our options with Derek [name] today.  One option would be to repeat biopsies.  We had good alignment with the navigation and I do not think  that is any more likely to be diagnostic a second time around.  A second option would be to follow this radiographically and repeat a CT scan in 2 months.  The third option would be to go ahead and treat empirically with stereotactic radiation.  He is interested in doing that.  I am going to refer him to Dr. Tammi Klippel of radiation oncology to see if he would be willing to treat him.  Plan: Refer to radiation oncology for consideration of stereotactic radiation. I will plan to see him back in 2 months with a repeat CT.  If he goes ahead with SBRT we will cancel that appointment.  [name], MD [facility] [phone]

## 2017-11-20 ENCOUNTER — Encounter: Payer: Self-pay | Admitting: Thoracic Surgery (Cardiothoracic Vascular Surgery)

## 2017-11-20 ENCOUNTER — Encounter: Payer: Self-pay | Admitting: Radiation Oncology

## 2017-11-20 NOTE — Progress Notes (Unsigned)
AFB cultures are positive at 1 week. Further information pending  Will refer to ID.  I let Dr. Ursula Alert know and will inform Dr. Tammi Klippel as well  Revonda Standard. Roxan Hockey, MD [facility] [phone]

## 2017-11-22 ENCOUNTER — Ambulatory Visit: Payer: Medicaid Other

## 2017-11-22 ENCOUNTER — Ambulatory Visit: Payer: Medicaid Other | Admitting: Radiation Oncology

## 2017-11-23 ENCOUNTER — Encounter: Payer: Self-pay | Admitting: Radiation Oncology

## 2017-11-25 NOTE — Progress Notes (Signed)
  [facility]         [phone] ________________________________  Initial outpatient Consultation  Name: BROOKE PAYES MRN: [id number]  Date: 11/26/2017  DOB: 04-26-1961  This patient was scheduled to meet with me tomorrow in consultation for 2 lung nodules, suspected to represent early stage lung cancer.  However, on bronchoscopy his specimens do not show malignancy and now show positive growth for AFB cultures.  I attempted to telephone the patient today to discuss this finding and potentially postpone his visit to the cancer center, in the absence of a cancer diagnosis and possible infectious process.  I was unable to reach him.  If the patient presents to the cancer center tomorrow, I will gladly meet with him and review his films.  I do think very close surveillance is warranted through treatment of possible AFB infection to ensure resolution of the suspicious lung nodules.  However, if I am able to postpone the appointment until further work-up and treatment is completed, I think that would also be clinically reasonable.  I am the on-call physician today and left the patient the number for our clinic in order for him to contact me at any time prior to the visit tomorrow to review.   ------------------------------------------------   Tyler Pita, MD [place] Director and Director of Stereotactic Radiosurgery Direct Dial: [phone]  Fax: [phone] [email or web address]  Skype  LinkedIn

## 2017-11-26 ENCOUNTER — Ambulatory Visit
Admission: RE | Admit: 2017-11-26 | Discharge: 2017-11-26 | Disposition: A | Discharge status: Home / Self Care | Payer: Medicaid Other | Source: Ambulatory Visit | Attending: Radiation Oncology | Admitting: Radiation Oncology

## 2017-11-26 ENCOUNTER — Ambulatory Visit: Payer: Medicaid Other

## 2017-11-27 LAB — AFB ORGANISM ID BY DNA PROBE
M AVIUM COMPLEX: POSITIVE — AB
M TUBERCULOSIS COMPLEX: NEGATIVE

## 2017-11-27 LAB — ACID FAST CULTURE WITH REFLEXED SENSITIVITIES

## 2017-11-27 LAB — ACID FAST CULTURE WITH REFLEXED SENSITIVITIES (MYCOBACTERIA): Acid Fast Culture: POSITIVE — AB

## 2017-11-30 LAB — CULTURE, FUNGUS WITHOUT SMEAR

## 2017-12-05 LAB — MAC SUSCEPTIBILITY BROTH
Ciprofloxacin: >16
Clarithromycin: 1
Moxifloxacin: 4

## 2017-12-05 LAB — AFB ORGANISM ID BY DNA PROBE
M AVIUM COMPLEX: POSITIVE — AB
M TUBERCULOSIS COMPLEX: NEGATIVE

## 2017-12-05 LAB — ACID FAST CULTURE WITH REFLEXED SENSITIVITIES (MYCOBACTERIA): Acid Fast Culture: POSITIVE — AB

## 2017-12-10 ENCOUNTER — Other Ambulatory Visit: Payer: Self-pay | Admitting: *Deleted

## 2017-12-10 DIAGNOSIS — R911 Solitary pulmonary nodule: Secondary | ICD-10-CM

## 2017-12-10 DIAGNOSIS — R918 Other nonspecific abnormal finding of lung field: Secondary | ICD-10-CM

## 2017-12-12 ENCOUNTER — Ambulatory Visit (INDEPENDENT_AMBULATORY_CARE_PROVIDER_SITE_OTHER): Payer: Medicaid Other | Admitting: Internal Medicine

## 2017-12-12 ENCOUNTER — Encounter: Payer: Self-pay | Admitting: Internal Medicine

## 2017-12-12 DIAGNOSIS — A310 Pulmonary mycobacterial infection: Secondary | ICD-10-CM | POA: Diagnosis not present

## 2017-12-12 NOTE — Progress Notes (Signed)
[facility] for Infectious Disease  Reason for Consult: Lung nodules positive for Mycobacterium avium Referring Physician: Dr. 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  I doubt that these 2 small nodules are causing his recent worsening shortness of breath.  I talked to him about management options including close observation off of antibiotics versus starting 3 drug therapy now with azithromycin and ethambutol and rifampin.  If we are going to treat we would need to plan on at least 12 months of therapy.  After discussing these options we agreed to careful observation for now.  I have asked him to take his temperature each evening to see if he is having any fever contributing to some intermittent night sweats.  I will see him back in 4-6 weeks and consider repeating his chest x-ray to see if the left upper lobe nodule has changed.  Plan: 1. Observe off of antibiotics for now 2. Collect sputum for repeat AFB culture 3. I asked him to take his temperature each evening to see if he is having fever 4. Follow-up here in 4-6 weeks  Patient Active Problem List   Diagnosis Date Noted  . 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 Gastritis and gastroduodenitis 01/10/2016  . Esophageal reflux 12/05/2015  . Colon cancer screening 10/07/2015  . Chronic hepatitis C without hepatic coma ([place]) 09/06/2015  . Alcoholism in remission ([place]) 08/03/2015  . Chronic obstructive pulmonary disease ([place]) 08/03/2015  . Cigarette nicotine dependence with other nicotine-induced disorder 08/03/2015    Patient's Medications  New Prescriptions   No medications on file  Previous Medications   ALBUTEROL (PROVENTIL HFA;VENTOLIN HFA) 108 (90 BASE) MCG/ACT INHALER    Inhale 2 puffs into the lungs every 6 (six) hours as needed for wheezing or shortness of breath.   ALBUTEROL  (PROVENTIL) (2.5 MG/3ML) 0.083% NEBULIZER SOLUTION    Take 3 mLs (2.5 mg total) by nebulization every 6 (six) hours as needed for wheezing or shortness of breath.   CYANOCOBALAMIN (VITAMIN B-12 PO)    Take 1 tablet by mouth daily.   IPRATROPIUM-ALBUTEROL (DUONEB) 0.5-2.5 (3) MG/3ML SOLN    Inhale 3 mLs into the lungs 4 (four) times daily.   LISINOPRIL (PRINIVIL,ZESTRIL) 10 MG TABLET    Take 1 tablet (10 mg total) by mouth daily.   MOMETASONE-FORMOTEROL (DULERA) 200-5 MCG/ACT AERO    Inhale 2 puffs into the lungs 2 (two) times daily.  Modified Medications   No medications on file  Discontinued Medications   CHANTIX 1 MG TABLET    Take 1 mg by mouth 2 (two) times daily.    HPI: Derek [name] is a 57 y.o. male with history of COPD and asthma.  He says that he began to note worsening dyspnea on exertion about 6 months ago.  He has not noted any change in his chronic cough productive of clear sputum.  He has not had any fever that he is aware of but he has had some intermittent night sweats.  He has had no change in his appetite.  He thinks he may have lost 2 or 3 pounds recently but says that that is not unusual for him.  Because of his worsening shortness of breath he had a chest x-ray which showed a left upper lobe nodule.  He had a CT scan which showed the left upper  lobe nodule and a right lower lobe nodule.  PET scan showed some hypermetabolic activity.  He underwent bronchoscopy and needle biopsy last month.  Pathologic examination did not show any evidence of cancer.  Cultures of the left upper lobe biopsy are growing Mycobacterium avium.   He is still smoking 1/2 pack of cigarettes daily.  He says that he recently tried Chantix but found that not only did it not help but it seemed like he wanted to smoke more.  He tells me that he has always been an "alcoholic", but that he does not drink nearly as much as he used to.  He says he usually drinks only 3 beers each evening.  He has a remote history  of polysubstance abuse including injecting drug use when he was in his 46s.  He believes that is how he contracted hepatitis C.  He took Harvoni from December 2017 until March 2018.  His hepatitis C viral load was undetectable in September 2018 indicating probable cure.  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  He lives in [place] with his father.  Review of Systems: Review of Systems  Constitutional: Positive for diaphoresis and weight loss. Negative for chills, fever and malaise/fatigue.  HENT: Negative for congestion and sore throat.   Respiratory: Positive for cough, hemoptysis, sputum production, shortness of breath and wheezing.        He had a brief, mild episode of hemoptysis after his bronchoscopy and biopsies.  Cardiovascular: Negative for chest pain.  Gastrointestinal: Positive for constipation and diarrhea. Negative for abdominal pain, heartburn, nausea and vomiting.  Skin: Negative for rash.      Past Medical History:  Diagnosis Date  . Arthritis   . Asthma   . COPD (chronic obstructive pulmonary disease) ([place])   . Emphysema lung ([place])   . Glaucoma   . Hepatitis    C  . Hypertension   . Lung nodules    Bilateral  . Substance abuse ([place])   . Tuberculosis     Social History   Tobacco Use  . Smoking status: Current Every Day Smoker    Packs/day: 0.50    Years: 40.00    Pack years: 20.00    Types: Cigarettes  . Smokeless tobacco: Never Used  Substance Use Topics  . Alcohol use: Yes    Comment: Drinks beer 2-3 times a week  . Drug use: Yes    Types: Marijuana, Cocaine    Comment: Remote cocaine use, Current marjiuana use    Family History  Problem Relation Age of Onset  . Cancer Mother        lymph nodes  . Ulcers Father   . Cholecystitis Father   . Colon cancer Neg Hx   . Colon polyps Neg Hx    No Known Allergies  OBJECTIVE: Vitals:   12/12/17 1532  BP: (!) 150/69  Pulse: 91  Temp: 98.5 F (36.9 C)    TempSrc: Oral  Weight: 173 lb (78.5 kg)   Body mass index is 23.46 kg/m.   Physical Exam  Constitutional: He is oriented to person, place, and time.  He is pleasant and in no distress.  HENT:  Mouth/Throat: No oropharyngeal exudate.  Cardiovascular: Normal rate, regular rhythm and normal heart sounds.  No murmur heard. Pulmonary/Chest: Effort normal. He has wheezes. He has no rales.  Abdominal: Soft. He exhibits no distension. There is no tenderness.  Neurological: He is alert and oriented to  person, place, and time.  Skin: No rash noted.  Scattered ecchymoses on arms.  Ruddy facial complexion.  Psychiatric: He has a normal mood and affect.    Microbiology: No results found for this or any previous visit (from the past 240 hour(s)).  [name], MD [facility] for Infectious [place] [facility] [phone] pager   [phone] cell 12/12/2017, 4:12 PM

## 2017-12-20 ENCOUNTER — Telehealth: Payer: Self-pay | Admitting: *Deleted

## 2017-12-20 NOTE — Telephone Encounter (Signed)
[facility] calling with critical lab results, sputum positive for acid-fast bacilli, identification to follow. Please advise if you want any additional lab orders. Landis Gandy, RN

## 2017-12-24 NOTE — Telephone Encounter (Signed)
His sputum cultures have been repeatedly positive for Mycobacterium avium.  Please verify that the lab will obtain antibiotic susceptibility testing.  Thanks.

## 2018-01-22 ENCOUNTER — Encounter: Payer: Medicaid Other | Admitting: Thoracic Surgery (Cardiothoracic Vascular Surgery)

## 2018-01-22 ENCOUNTER — Other Ambulatory Visit: Payer: Medicaid Other

## 2018-01-23 LAB — MYCOBACTERIA,CULT W/FLUOROCHROME SMEAR
MICRO NUMBER:: 90473084
SPECIMEN QUALITY: ADEQUATE

## 2018-01-24 ENCOUNTER — Ambulatory Visit: Payer: Medicaid Other | Admitting: Internal Medicine

## 2018-01-30 ENCOUNTER — Encounter: Payer: Self-pay | Admitting: Gastroenterology

## 2018-04-22 LAB — MAC SUSCEPTIBILITY BROTH

## 2018-08-05 IMAGING — CT CT CHEST SUPER D W/O CM
2 of 4 series · 12 of 36 positions shown, 15 images · non-contrast
Comparison: PET 09/11/2017 and CT chest 08/23/2017.

CLINICAL DATA: Preop left upper lobe nodule.

EXAM:
CT CHEST WITHOUT CONTRAST
TECHNIQUE: Multidetector CT imaging of the chest was performed using thin slice
collimation for electromagnetic bronchoscopy planning purposes,
without intravenous contrast.

[Series 2: chest 2.00 br40 s3 ax · axial · 0.55mm/px · z∈[+1505,+1819]mm · 9 of 187 slices shown, 12 images]
[im 15/187  mediastinal]
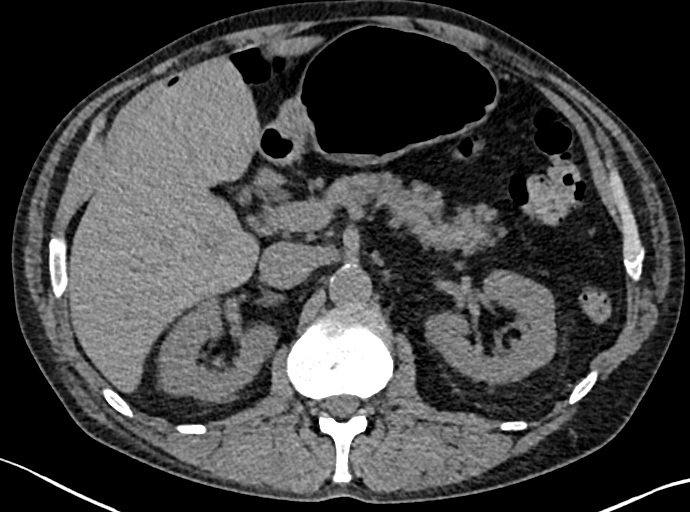
[im 15/187  lung]
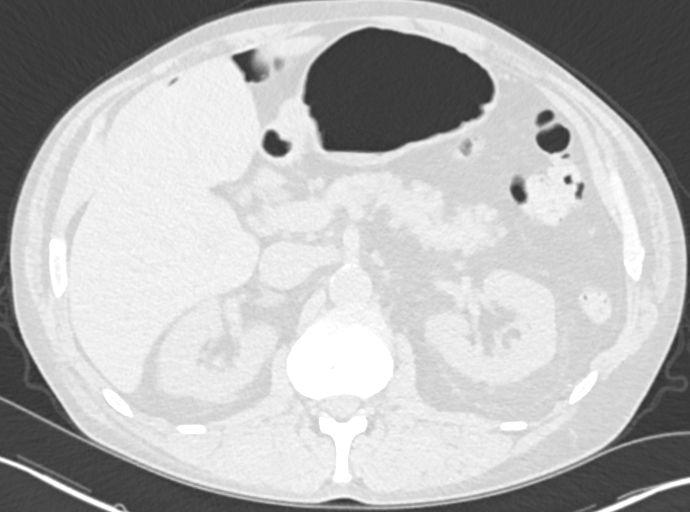
[im 43/187  lung]
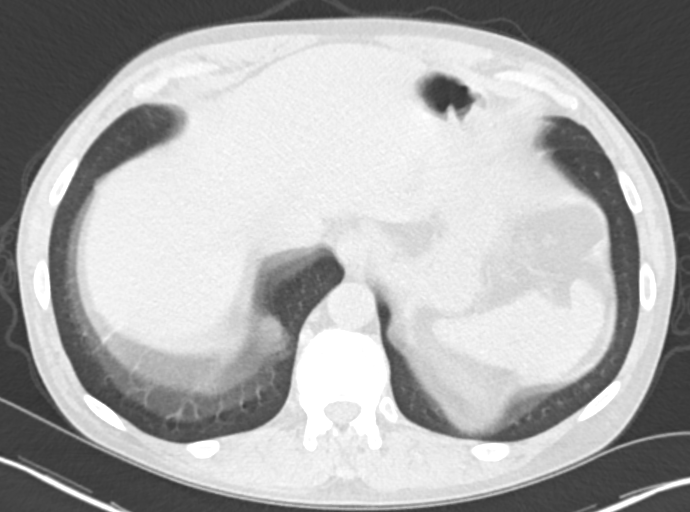
[im 58/187  lung]
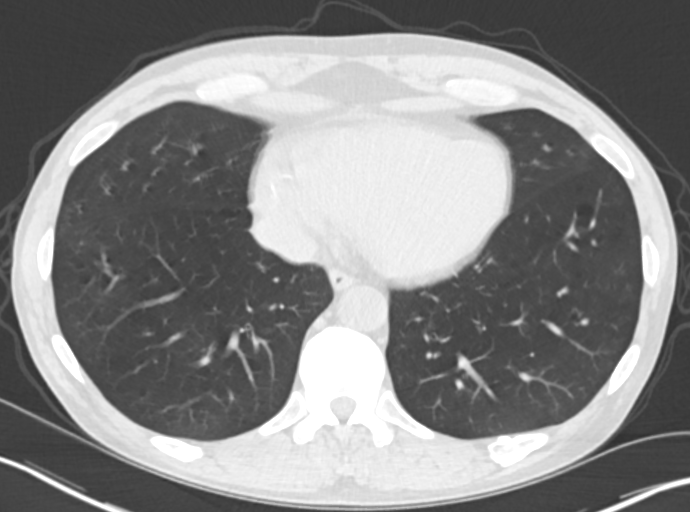
[im 72/187  lung]
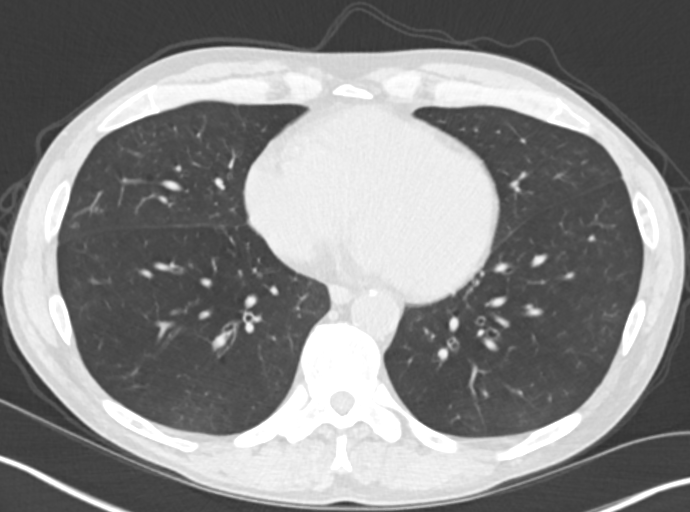
[im 101/187  mediastinal]
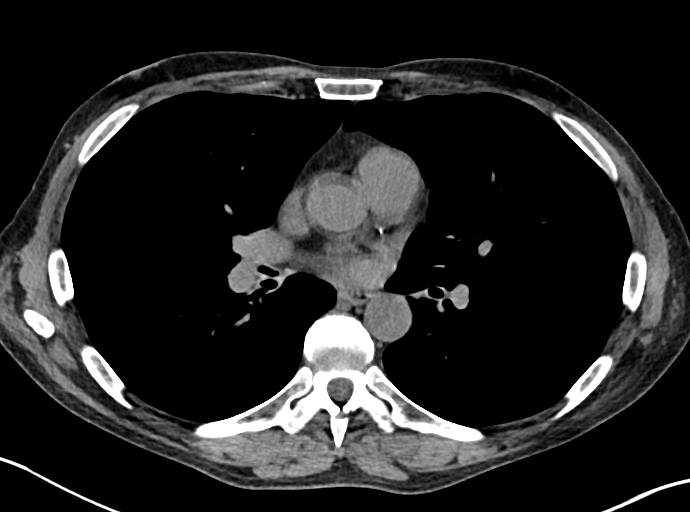
[im 101/187  lung]
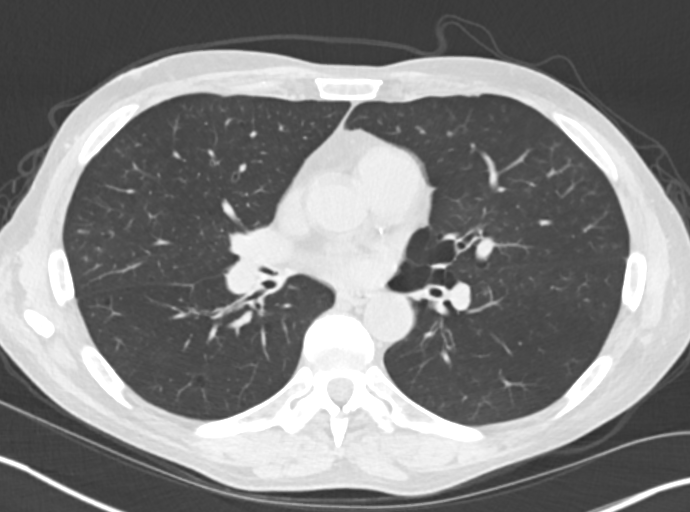
[im 115/187  lung]
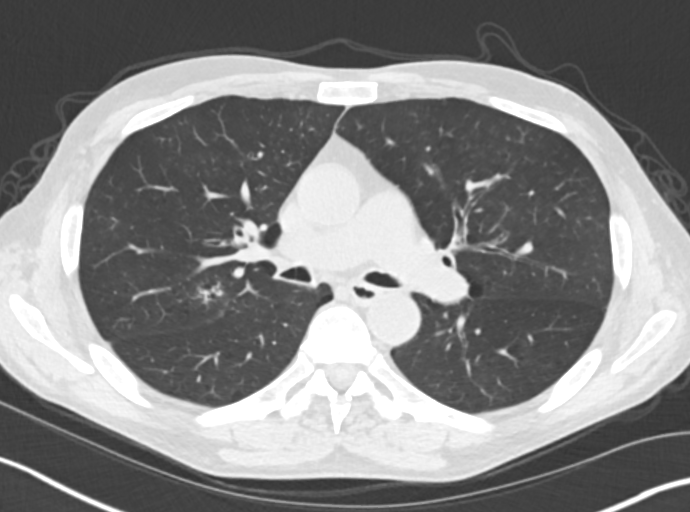
[im 129/187  lung]
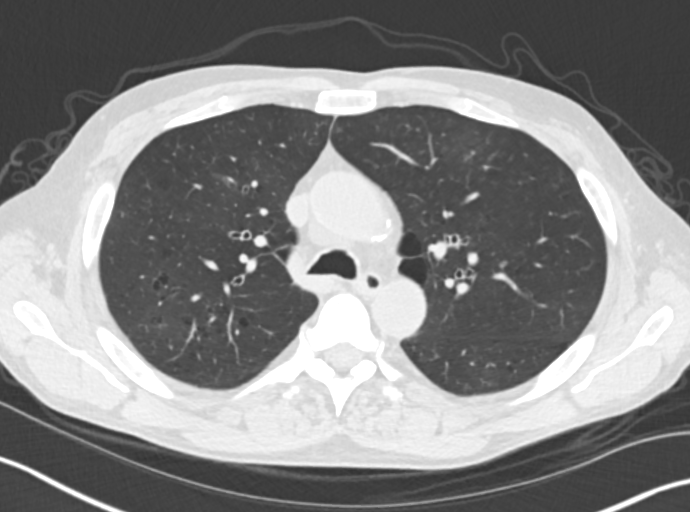
[im 158/187  lung]
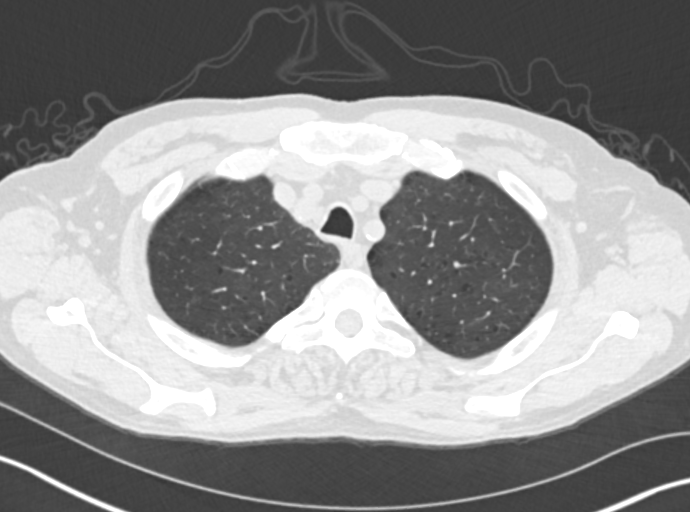
[im 172/187  mediastinal]
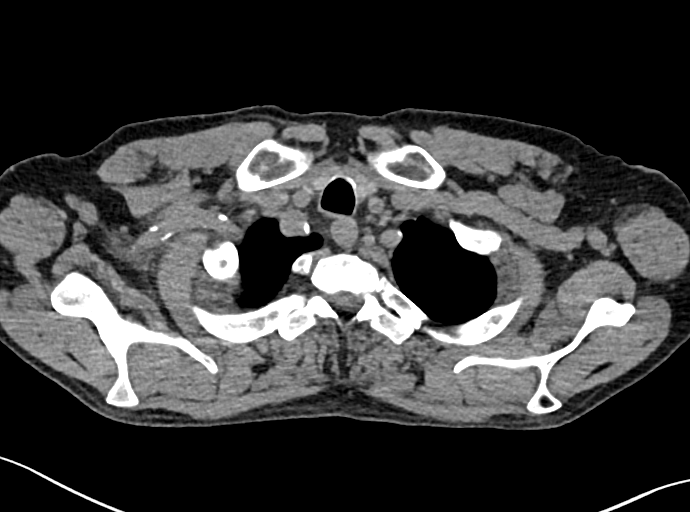
[im 172/187  lung]
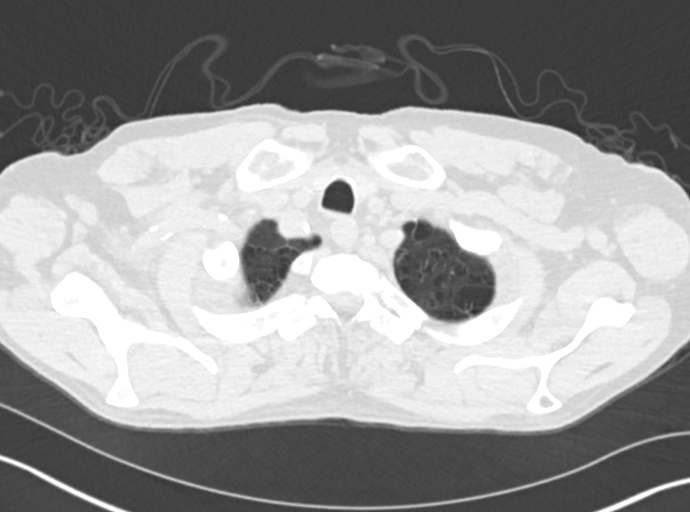

[Series 4: chest 2.00 br40 s3 cor · coronal · 0.73mm/px · 3 of 140 slices shown]
[im 28/140  lung]
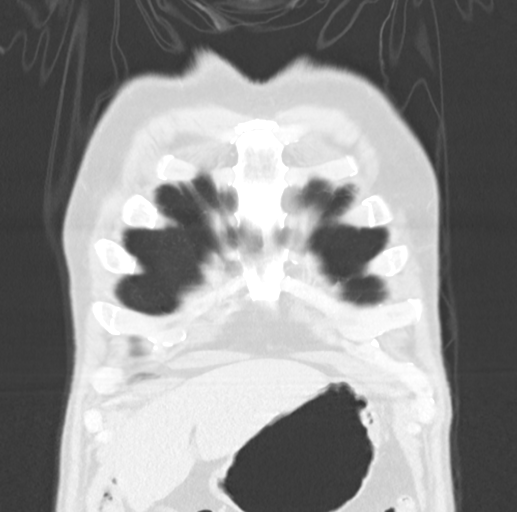
[im 56/140  lung]
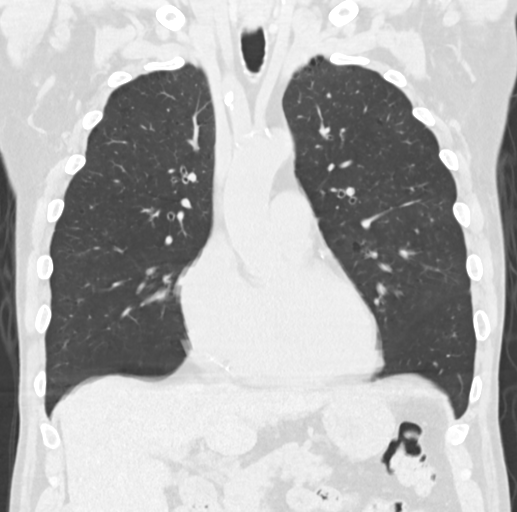
[im 84/140  lung]
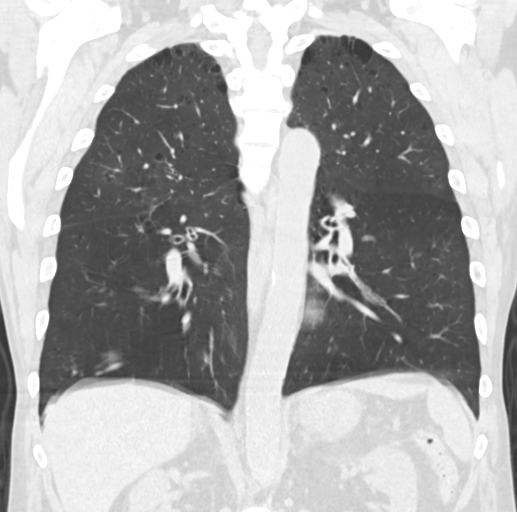

[12 of 36 positions shown; findings below may reference images not displayed]

FINDINGS: Cardiovascular: Atherosclerotic calcification of the arterial
vasculature, including coronary arteries. Heart size normal. No
pericardial effusion.

Mediastinum/Nodes: No pathologically enlarged mediastinal or
axillary lymph nodes. Hilar regions are difficult to definitively
evaluate without IV contrast but appear grossly unremarkable.
Esophagus is grossly unremarkable.

Lungs/Pleura: Centrilobular and paraseptal emphysema. Image quality
in the mid and lower lung zones is degraded by respiratory motion.
Scattered peribronchovascular ground-glass and nodularity, new.
Ovoid lesion in the superior segment right lower lobe surrounds a
dilated bronchus and measures approximately 0.8 x 1.8 cm (series 8,
image 84), similar to 08/23/2017. Left upper lobe nodule measures
1.2 x 1.4 cm (image 52), stable from 08/23/2017. No pleural fluid.
Airway is unremarkable.

Upper Abdomen: Visualized portions of the liver, gallbladder,
adrenal glands, kidneys, spleen, pancreas, stomach and bowel are
grossly unremarkable. No upper abdominal adenopathy.

Musculoskeletal: Degenerative changes in the spine. Old left rib
fracture. No worrisome lytic or sclerotic lesions.
IMPRESSION: 1. Right lower lobe and left upper lobe nodules. Synchronous primary
bronchogenic carcinomas are considered.
2. Mild scattered peribronchovascular ground-glass and nodularity
are new and likely due to an infectious
bronchiolitis/bronchopneumonia.
3. Aortic atherosclerosis (GP1P5-170.0). Coronary artery
calcification.
4.  Emphysema (GP1P5-HWN.H).

## 2018-08-07 IMAGING — CR DG CHEST 2V
2 series · 2 of 2 positions shown · non-contrast
Comparison: Chest radiograph performed 10/31/2017, and CT of the
chest performed 11/07/2017

CLINICAL DATA: Preoperative chest radiograph.

EXAM:
CHEST - 2 VIEW

[w chest pa]
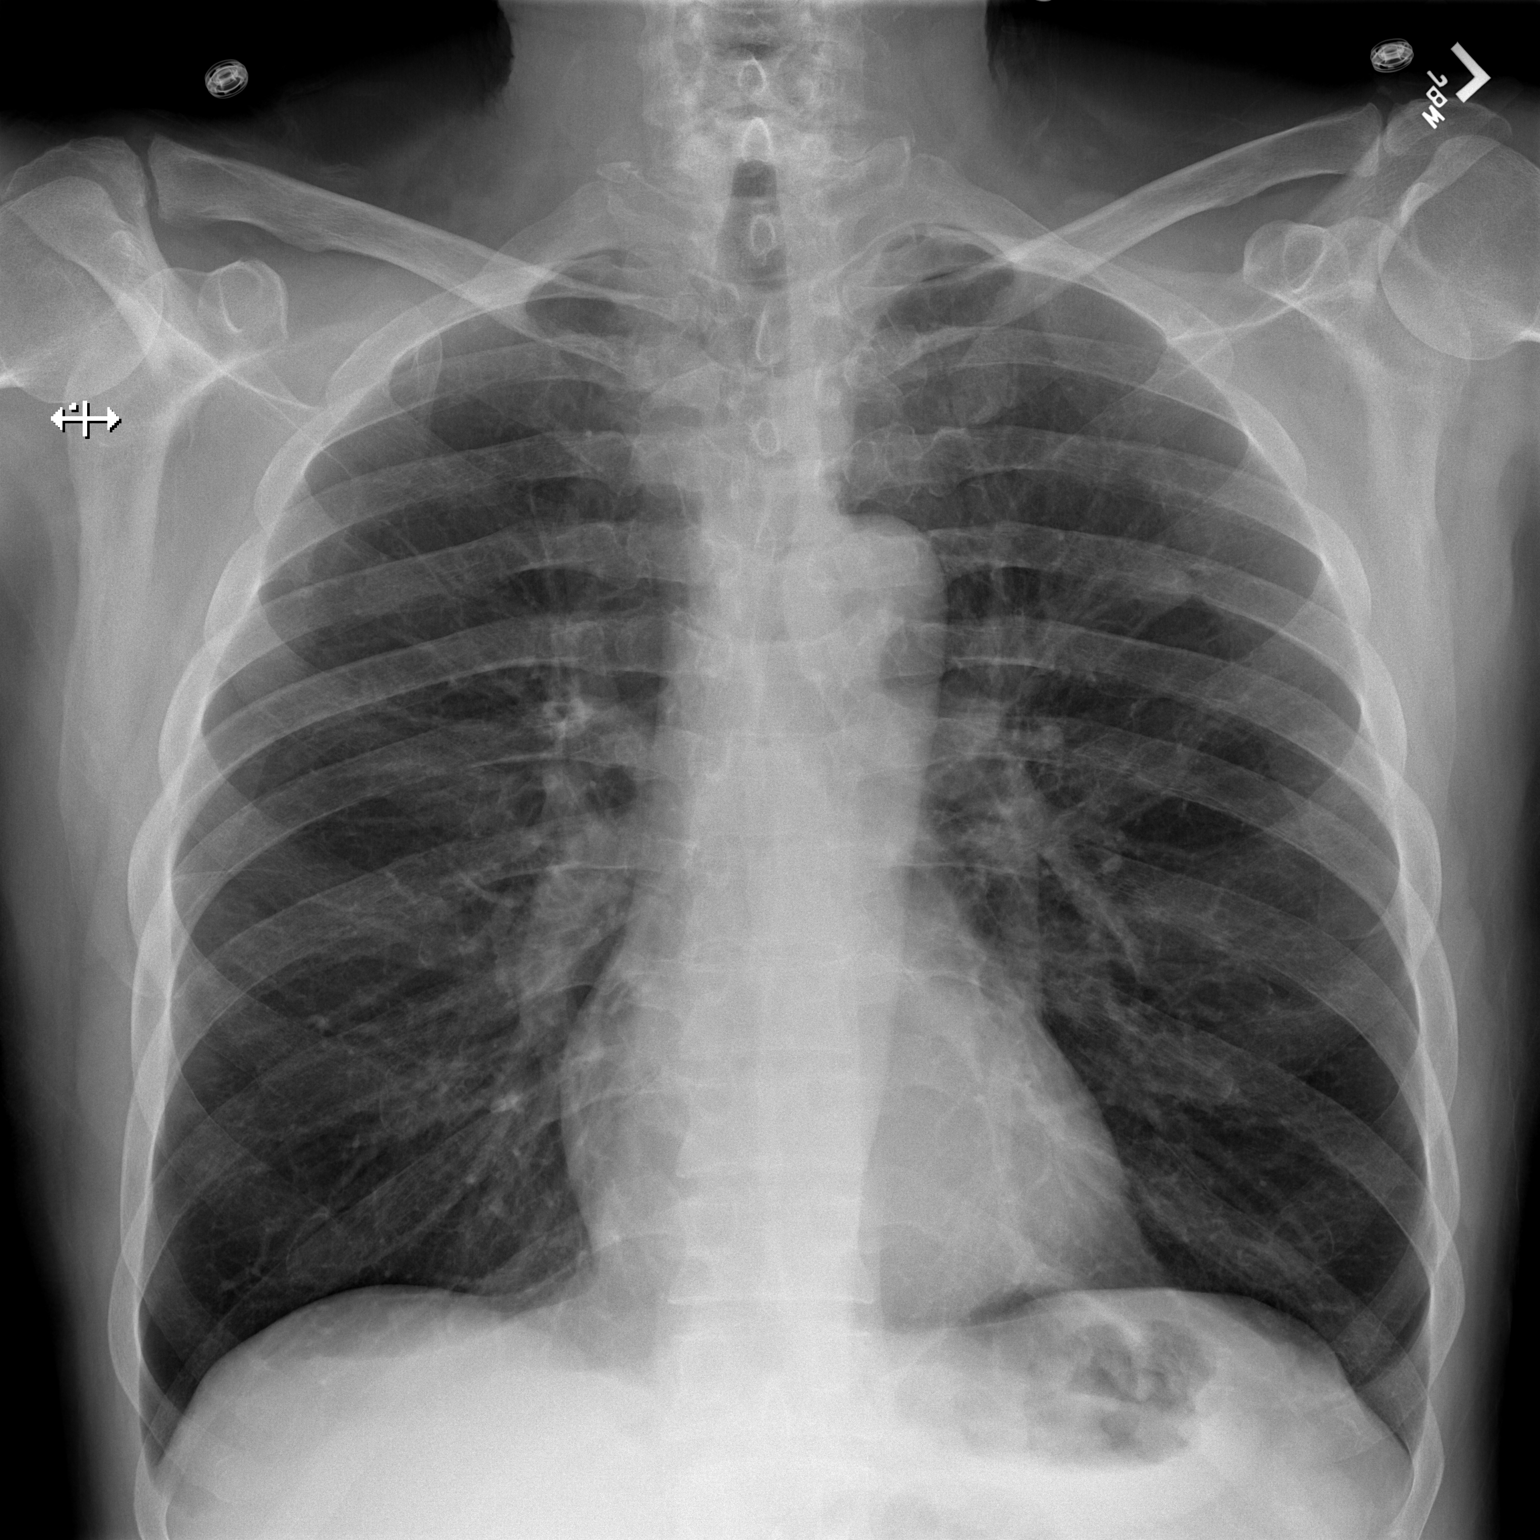

[w chest lat]
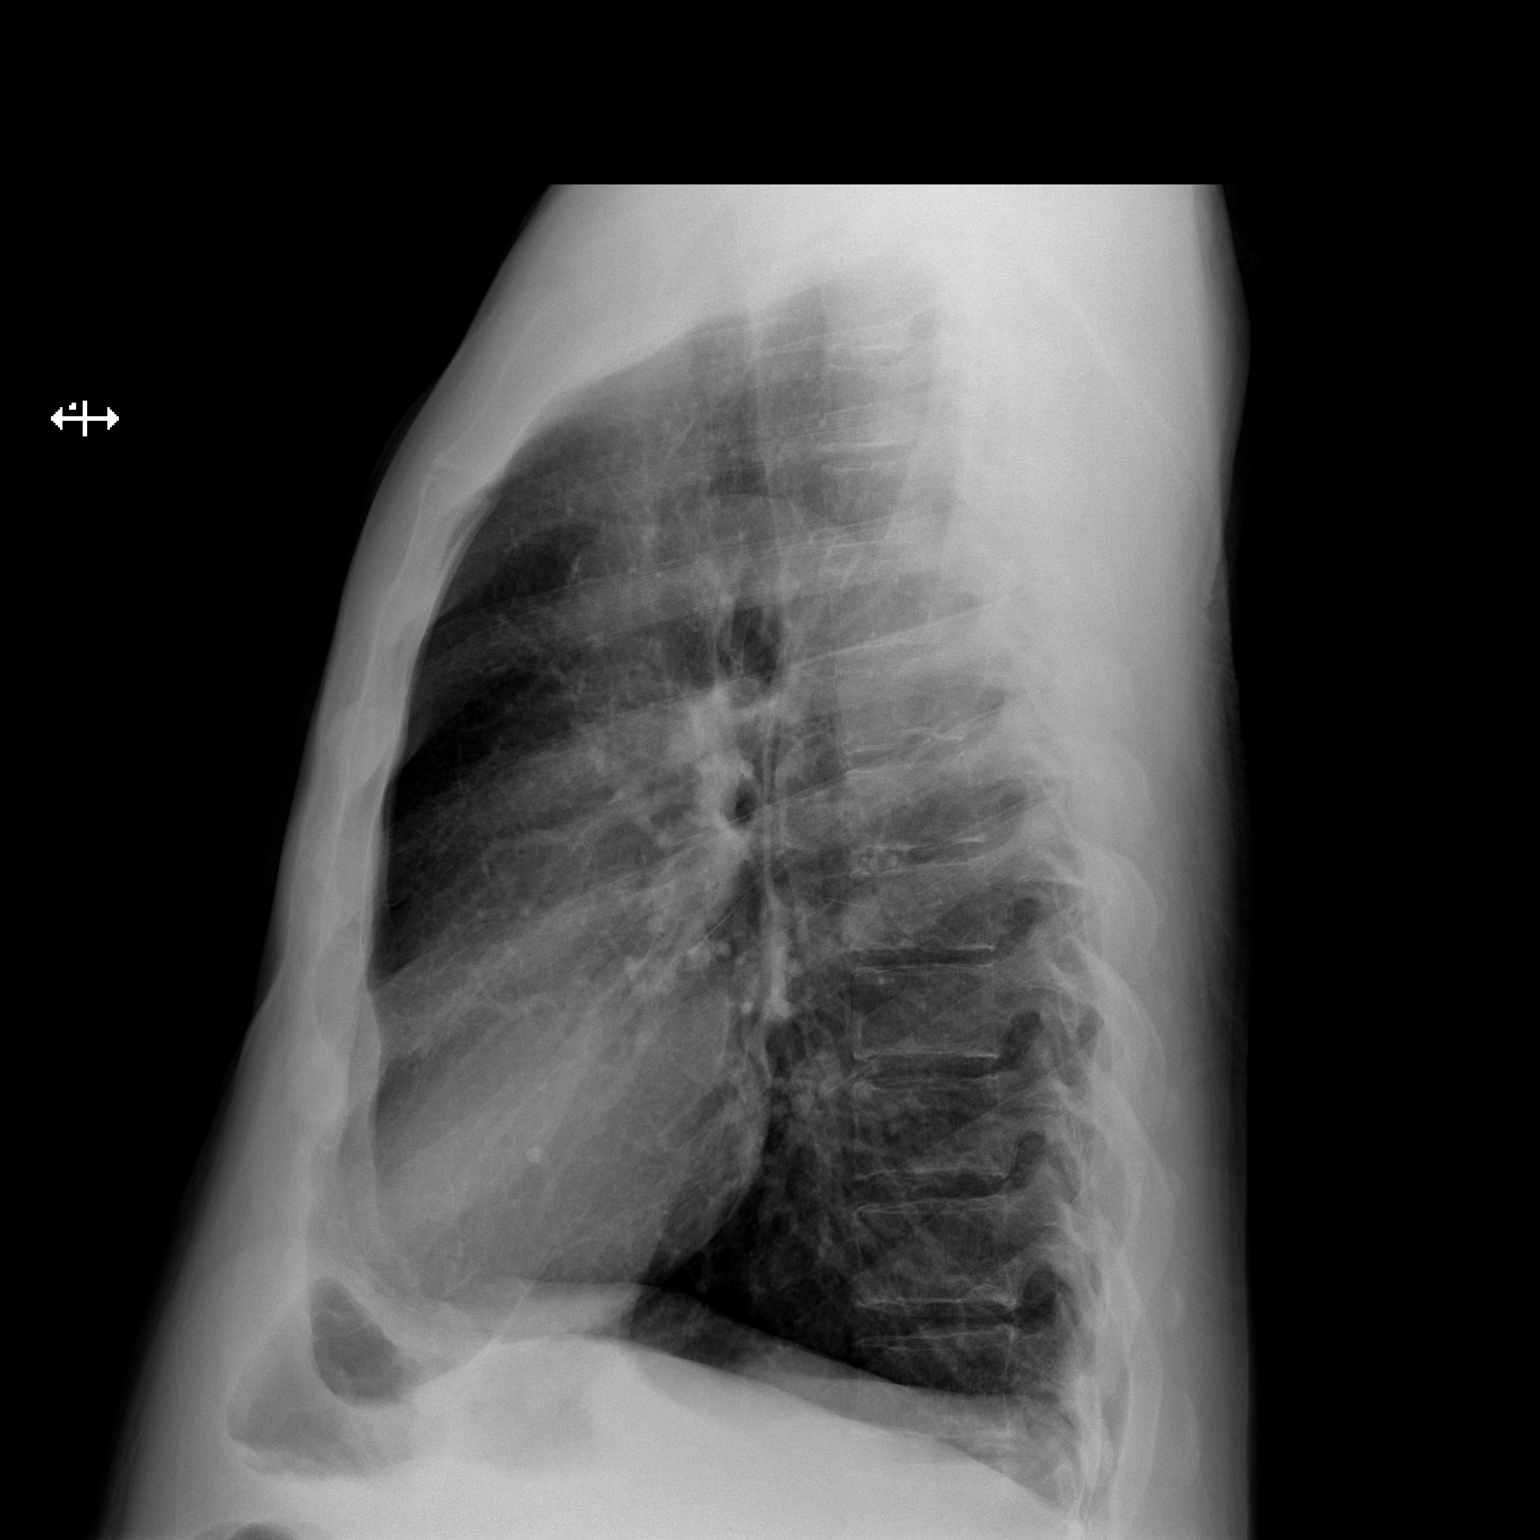

[2 of 2 positions shown; findings below may reference images not displayed]

FINDINGS: The lungs are well-aerated. The right lower lobe nodule is not well
seen. The left upper lobe nodule is again noted. There is no
evidence of pleural effusion or pneumothorax.

The heart is normal in size; the mediastinal contour is within
normal limits. No acute osseous abnormalities are seen.
IMPRESSION: Left upper lobe pulmonary nodule again noted. Right lower lobe
pulmonary nodule not well seen on radiograph.

## 2019-02-17 ENCOUNTER — Other Ambulatory Visit: Payer: Self-pay

## 2019-02-17 ENCOUNTER — Ambulatory Visit
Admission: RE | Admit: 2019-02-17 | Discharge: 2019-02-17 | Disposition: A | Discharge status: Home / Self Care | Payer: Medicaid Other | Source: Ambulatory Visit | Attending: Internal Medicine | Admitting: Internal Medicine

## 2019-02-17 ENCOUNTER — Encounter: Payer: Self-pay | Admitting: Internal Medicine

## 2019-02-17 ENCOUNTER — Ambulatory Visit (INDEPENDENT_AMBULATORY_CARE_PROVIDER_SITE_OTHER): Payer: Medicaid Other | Admitting: Internal Medicine

## 2019-02-17 DIAGNOSIS — A310 Pulmonary mycobacterial infection: Secondary | ICD-10-CM

## 2019-02-17 DIAGNOSIS — B182 Chronic viral hepatitis C: Secondary | ICD-10-CM

## 2019-02-17 DIAGNOSIS — F17218 Nicotine dependence, cigarettes, with other nicotine-induced disorders: Secondary | ICD-10-CM

## 2019-02-17 NOTE — Assessment & Plan Note (Signed)
I encouraged him to continue his efforts to try to cut down and quit smoking cigarettes.

## 2019-02-17 NOTE — Progress Notes (Signed)
[name] for Infectious Disease  Patient Active Problem List   Diagnosis Date Noted  . 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 Gastritis and gastroduodenitis 01/10/2016  . Esophageal reflux 12/05/2015  . Colon cancer screening 10/07/2015  . Chronic hepatitis C without hepatic coma ([place]) 09/06/2015  . Alcoholism in remission ([place]) 08/03/2015  . Chronic obstructive pulmonary disease ([place]) 08/03/2015  . Cigarette nicotine dependence with other nicotine-induced disorder 08/03/2015    Patient's Medications  New Prescriptions   No medications on file  Previous Medications   ALBUTEROL (PROVENTIL HFA;VENTOLIN HFA) 108 (90 BASE) MCG/ACT INHALER    Inhale 2 puffs into the lungs every 6 (six) hours as needed for wheezing or shortness of breath.   ALBUTEROL (PROVENTIL) (2.5 MG/3ML) 0.083% NEBULIZER SOLUTION    Take 3 mLs (2.5 mg total) by nebulization every 6 (six) hours as needed for wheezing or shortness of breath.   AMOXICILLIN (AMOXIL) 500 MG CAPSULE    Take 500 mg by mouth 3 (three) times daily.   CYANOCOBALAMIN (VITAMIN B-12 PO)    Take 1 tablet by mouth daily.   IPRATROPIUM-ALBUTEROL (DUONEB) 0.5-2.5 (3) MG/3ML SOLN    Inhale 3 mLs into the lungs 4 (four) times daily.   LISINOPRIL (PRINIVIL,ZESTRIL) 10 MG TABLET    Take 1 tablet (10 mg total) by mouth daily.   SYMBICORT 160-4.5 MCG/ACT INHALER    Inhale 1 puff into the lungs 2 (two) times daily.  Modified Medications   No medications on file  Discontinued Medications   MOMETASONE-FORMOTEROL (DULERA) 200-5 MCG/ACT AERO    Inhale 2 puffs into the lungs 2 (two) times daily.    Subjective: Derek [name] is in for his routine follow-up visit.  He has a history of COPD and asthma.  He says that he began to note worsening dyspnea on exertion about 2 years ago.  Because of his worsening shortness of breath he had a chest x-ray which showed a left upper lobe nodule.  He had a CT scan which showed the  left upper lobe nodule and a right lower lobe nodule.  PET scan showed some hypermetabolic activity.  He underwent bronchoscopy and needle biopsy last year.  Pathologic examination did not show any evidence of cancer.  Cultures of the left upper lobe biopsy grew Mycobacterium avium.   When I first saw him last year we opted to observe him off of antibiotic therapy.  He is still smoking 1 pack of cigarettes daily.  He says that he recently tried Chantix but found that not only did it not help but it seemed like he wanted to smoke more.  He tells me that he has always been an "alcoholic", but that he does not drink nearly as much as he used to.  He says he usually drinks only 3 beers each evening.  He has a remote history of polysubstance abuse including injecting drug use when he was in his 21s.  He believes that is how he contracted hepatitis C.  He took Harvoni from December 2017 until March 2018.  His hepatitis C viral load was undetectable in September 2018 indicating probable cure.  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  He says that over the past few months he has noted increased cough, especially in the morning.  He usually brings up white phlegm.  He  has also noted more shortness of breath.  He has not had any fever or chills but does have occasional night sweats.  He has not had any change in his appetite or weight.  He recently saw his PCP and was started on amoxicillin for bronchitis and did note some improvement in his cough.  He only took 2 doses then quit.  Dogs okay 1 more plant this morning blood work  Review of Systems: Review of Systems  Constitutional: Positive for diaphoresis. Negative for chills, fever, malaise/fatigue and weight loss.  HENT: Negative for sore throat.   Respiratory: Positive for cough, sputum production, shortness of breath and wheezing.   Cardiovascular: Negative for chest pain.   Gastrointestinal: Negative for abdominal pain, diarrhea, heartburn, nausea and vomiting.  Genitourinary: Negative for dysuria and frequency.  Musculoskeletal: Negative for joint pain and myalgias.  Skin: Negative for rash.  Neurological: Negative for dizziness and headaches.  Endo/Heme/Allergies: Bruises/bleeds easily.  Psychiatric/Behavioral: Negative for depression and substance abuse. The patient is not nervous/anxious.     Past Medical History:  Diagnosis Date  . Arthritis   . Asthma   . COPD (chronic obstructive pulmonary disease) ([place])   . Emphysema lung ([place])   . Glaucoma   . Hepatitis    C  . Hypertension   . Lung nodules    Bilateral  . Substance abuse ([place])   . Tuberculosis     Social History   Tobacco Use  . Smoking status: Current Every Day Smoker    Packs/day: 1.00    Years: 40.00    Pack years: 40.00    Types: Cigarettes  . Smokeless tobacco: Never Used  Substance Use Topics  . Alcohol use: Yes    Comment: Drinks beer 2-3 times a week  . Drug use: Yes    Types: Marijuana, Cocaine    Comment: Remote cocaine use, Current marjiuana use    Family History  Problem Relation Age of Onset  . Cancer Mother        lymph nodes  . Ulcers Father   . Cholecystitis Father   . Colon cancer Neg Hx   . Colon polyps Neg Hx     No Known Allergies  Objective: Vitals:   02/17/19 1042  BP: (!) 146/85  Pulse: 85  Temp: 98.4 F (36.9 C)  TempSrc: Oral  SpO2: 95%  Weight: 176 lb (79.8 kg)  Height: 6' (1.829 m)   Body mass index is 23.87 kg/m.  Physical Exam Constitutional:      Comments: He is in good spirits.  His weight is unchanged.  HENT:     Mouth/Throat:     Pharynx: No oropharyngeal exudate.  Eyes:     Conjunctiva/sclera: Conjunctivae normal.  Cardiovascular:     Rate and Rhythm: Normal rate and regular rhythm.     Heart sounds: No murmur.  Pulmonary:     Effort: Pulmonary effort is normal.     Breath sounds: Wheezing present. No rales.   Abdominal:     Palpations: Abdomen is soft. There is no mass.     Tenderness: There is no abdominal tenderness.  Musculoskeletal: Normal range of motion.  Skin:    Findings: Bruising present. No rash.     Comments: He has extensive ecchymoses on his forearms and elbows.  He says it has been like this for several years.  Neurological:     Mental Status: He is alert and oriented to person, place, and time.  Psychiatric:  Mood and Affect: Mood normal.     Lab Results    Problem List Items Addressed This Visit      High   Mycobacterium avium infection ([place])    I will check a chest x-ray, lab work and obtain a sputum for AFB smear and culture.  I will see him back in 6 weeks.      Relevant Orders   CBC   Comprehensive metabolic panel   DG Chest 2 View   MYCOBACTERIA, CULTURE, WITH FLUOROCHROME SMEAR   Hepatitis C RNA quantitative     Unprioritized   Cigarette nicotine dependence with other nicotine-induced disorder    I encouraged him to continue his efforts to try to cut down and quit smoking cigarettes.      Chronic hepatitis C without hepatic coma ([place])    I will check a repeat hepatitis C viral load to see if he was cured of his hepatitis C.          [name], MD [facility] for [place] [phone] pager   [phone] cell 02/17/2019, 11:10 AM

## 2019-02-17 NOTE — Assessment & Plan Note (Signed)
I will check a chest x-ray, lab work and obtain a sputum for AFB smear and culture.  I will see him back in 6 weeks.

## 2019-02-17 NOTE — Assessment & Plan Note (Signed)
I will check a repeat hepatitis C viral load to see if he was cured of his hepatitis C.

## 2019-02-20 ENCOUNTER — Telehealth: Payer: Self-pay | Admitting: *Deleted

## 2019-02-20 LAB — COMPREHENSIVE METABOLIC PANEL
AG Ratio: 1.4 (calc) (ref 1.0–2.5)
ALT: 21 U/L (ref 9–46)
AST: 25 U/L (ref 10–35)
Albumin: 4.4 g/dL (ref 3.6–5.1)
Alkaline phosphatase (APISO): 105 U/L (ref 35–144)
BUN: 11 mg/dL (ref 7–25)
CO2: 30 mmol/L (ref 20–32)
Calcium: 10 mg/dL (ref 8.6–10.3)
Chloride: 102 mmol/L (ref 98–110)
Creat: 0.81 mg/dL (ref 0.70–1.33)
Globulin: 3.2 g/dL (calc) (ref 1.9–3.7)
Glucose, Bld: 88 mg/dL (ref 65–99)
Potassium: 4.3 mmol/L (ref 3.5–5.3)
Sodium: 139 mmol/L (ref 135–146)
Total Bilirubin: 0.4 mg/dL (ref 0.2–1.2)
Total Protein: 7.6 g/dL (ref 6.1–8.1)

## 2019-02-20 LAB — CBC
HCT: 47.6 % (ref 38.5–50.0)
Hemoglobin: 16.4 g/dL (ref 13.2–17.1)
MCH: 33.8 pg — ABNORMAL HIGH (ref 27.0–33.0)
MCHC: 34.5 g/dL (ref 32.0–36.0)
MCV: 98.1 fL (ref 80.0–100.0)
MPV: 10.3 fL (ref 7.5–12.5)
Platelets: 311 10*3/uL (ref 140–400)
RBC: 4.85 10*6/uL (ref 4.20–5.80)
RDW: 12.5 % (ref 11.0–15.0)
WBC: 12.6 10*3/uL — ABNORMAL HIGH (ref 3.8–10.8)

## 2019-02-20 LAB — HEPATITIS C RNA QUANTITATIVE
HCV Quantitative Log: <1.18 Log IU/mL
HCV RNA, PCR, QN: <15 IU/mL

## 2019-02-20 NOTE — Telephone Encounter (Signed)
[facility] called with urgent result for his sputum MICRO NUMBER: [id number] P   SPECIMEN QUALITY: Adequate P   Source: SPUTUM P   STATUS: PRELIMINARY P   SMEAR: Rare (1 +) acid-fast bacilli seen using the fluorochrome method.Abnormal  P   RESULT: Culture results to follow. Final reports of negative cultures can be expected in approximately six weeks. Positive cultures are reported immediately.     Patient was referred here for MAC.  Landis Gandy, RN

## 2019-02-26 NOTE — Telephone Encounter (Signed)
Lab Status changed:     Component 9d ago  MICRO NUMBER: [id number] P   SPECIMEN QUALITY: Adequate P   Source: SPUTUM P   STATUS: PRELIMINARY P   SMEAR: Rare (1 +) acid-fast bacilli seen using the fluorochrome method.Abnormal  P   ISOLATE 1: acid-fast bacillus Abnormal  P   Comment: Acid-fast bacilli present in liquid culture media. Identification to follow.

## 2019-03-06 ENCOUNTER — Telehealth: Payer: Self-pay | Admitting: *Deleted

## 2019-03-06 NOTE — Telephone Encounter (Signed)
Received call from [facility] regarding lab results. Patient being seen by Dr Megan Salon for positive sputum.    Component 2wk ago  MICRO NUMBER: [id number] P   SPECIMEN QUALITY: Adequate P   Source: SPUTUM P   STATUS: PRELIMINARY P   SMEAR: Rare (1 +) acid-fast bacilli seen using the fluorochrome method.Abnormal  P   ISOLATE 1: Mycobacterium, avium-intracellulare group Abnormal  P   Comment: DNA probe result positive for Mycobacterium avium complex

## 2019-03-27 ENCOUNTER — Other Ambulatory Visit: Payer: Self-pay

## 2019-03-27 ENCOUNTER — Ambulatory Visit (INDEPENDENT_AMBULATORY_CARE_PROVIDER_SITE_OTHER): Payer: Medicaid Other | Admitting: Internal Medicine

## 2019-03-27 ENCOUNTER — Encounter: Payer: Self-pay | Admitting: Internal Medicine

## 2019-03-27 DIAGNOSIS — F17218 Nicotine dependence, cigarettes, with other nicotine-induced disorders: Secondary | ICD-10-CM | POA: Diagnosis not present

## 2019-03-27 DIAGNOSIS — A310 Pulmonary mycobacterial infection: Secondary | ICD-10-CM

## 2019-03-27 DIAGNOSIS — B182 Chronic viral hepatitis C: Secondary | ICD-10-CM

## 2019-03-27 DIAGNOSIS — F1021 Alcohol dependence, in remission: Secondary | ICD-10-CM | POA: Diagnosis not present

## 2019-03-27 MED ORDER — RIFAMPIN 300 MG PO CAPS: 600.0000 mg | ORAL_CAPSULE | Freq: Every day | ORAL | 11 refills | Status: DC

## 2019-03-27 MED ORDER — AZITHROMYCIN 500 MG PO TABS: 500.0000 mg | ORAL_TABLET | Freq: Every day | ORAL | 11 refills | Status: DC

## 2019-03-27 MED ORDER — ETHAMBUTOL HCL 400 MG PO TABS: 1200.0000 mg | ORAL_TABLET | Freq: Every day | ORAL | 11 refills | Status: DC

## 2019-03-27 NOTE — Progress Notes (Signed)
[facility] for Infectious Disease  Patient Active Problem List   Diagnosis Date Noted  . 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 Gastritis and gastroduodenitis 01/10/2016  . Esophageal reflux 12/05/2015  . Colon cancer screening 10/07/2015  . Chronic hepatitis C without hepatic coma ([place]) 09/06/2015  . Alcoholism in remission ([place]) 08/03/2015  . Chronic obstructive pulmonary disease ([place]) 08/03/2015  . Cigarette nicotine dependence with other nicotine-induced disorder 08/03/2015    Patient's Medications  New Prescriptions   AZITHROMYCIN (ZITHROMAX) 500 MG TABLET    Take 1 tablet (500 mg total) by mouth daily.   ETHAMBUTOL (MYAMBUTOL) 400 MG TABLET    Take 3 tablets (1,200 mg total) by mouth daily.   RIFAMPIN (RIFADIN) 300 MG CAPSULE    Take 2 capsules (600 mg total) by mouth daily.  Previous Medications   ALBUTEROL (PROVENTIL HFA;VENTOLIN HFA) 108 (90 BASE) MCG/ACT INHALER    Inhale 2 puffs into the lungs every 6 (six) hours as needed for wheezing or shortness of breath.   ALBUTEROL (PROVENTIL) (2.5 MG/3ML) 0.083% NEBULIZER SOLUTION    Take 3 mLs (2.5 mg total) by nebulization every 6 (six) hours as needed for wheezing or shortness of breath.   AMOXICILLIN (AMOXIL) 500 MG CAPSULE    Take 500 mg by mouth 3 (three) times daily.   CYANOCOBALAMIN (VITAMIN B-12 PO)    Take 1 tablet by mouth daily.   IPRATROPIUM-ALBUTEROL (DUONEB) 0.5-2.5 (3) MG/3ML SOLN    Inhale 3 mLs into the lungs 4 (four) times daily.   LISINOPRIL (PRINIVIL,ZESTRIL) 10 MG TABLET    Take 1 tablet (10 mg total) by mouth daily.   SYMBICORT 160-4.5 MCG/ACT INHALER    Inhale 1 puff into the lungs 2 (two) times daily.  Modified Medications   No medications on file  Discontinued Medications   No medications on file    Subjective: .Dod is in for his routine follow-up visit.  He is accompanied by his daughter, Casimer Bilis.  Dad says that over the past year he has noted worsening of  his cough.  He says that recently he has to get up around 3 AM and use his nebulizer because of severe coughing fits.  He is also bringing up more phlegm and he notes more shortness of breath.  He does not think he has had any fever but he has had intermittent night sweats.  He states that his appetite is good.  He continues to smoke cigarettes and has not been able to cut down.  He also continues to drink alcohol heavily and has been having some "shakes" when he first gets up in the morning.  He says that he has never had problems with alcohol withdrawal before.  He has quit drinking on occasion in the past with the help of AA.  Review of Systems: Review of Systems  Constitutional: Positive for diaphoresis and malaise/fatigue. Negative for chills, fever and weight loss.  HENT: Negative for sore throat.   Respiratory: Positive for cough, sputum production, shortness of breath and wheezing. Negative for hemoptysis.   Cardiovascular: Negative for chest pain.  Gastrointestinal: Positive for heartburn. Negative for abdominal pain, diarrhea, nausea and vomiting.  Genitourinary: Negative for dysuria and frequency.  Musculoskeletal: Negative for joint pain and myalgias.  Skin: Negative for rash.  Neurological: Negative for dizziness and headaches.  Psychiatric/Behavioral: Positive for substance abuse. Negative for depression. The patient is not nervous/anxious.     Past  Medical History:  Diagnosis Date  . Arthritis   . Asthma   . COPD (chronic obstructive pulmonary disease) ([place])   . Emphysema lung ([place])   . Glaucoma   . Hepatitis    C  . Hypertension   . Lung nodules    Bilateral  . Substance abuse ([place])   . Tuberculosis     Social History   Tobacco Use  . Smoking status: Current Every Day Smoker    Packs/day: 1.00    Years: 40.00    Pack years: 40.00    Types: Cigarettes  . Smokeless tobacco: Never Used  Substance Use Topics  . Alcohol use: Yes    Comment: Drinks beer 2-3 times  a week  . Drug use: Yes    Types: Marijuana, Cocaine    Comment: Remote cocaine use, Current marjiuana use    Family History  Problem Relation Age of Onset  . Cancer Mother        lymph nodes  . Ulcers Father   . Cholecystitis Father   . Colon cancer Neg Hx   . Colon polyps Neg Hx     No Known Allergies  Objective: Vitals:   03/27/19 0937  BP: (!) 159/88  Pulse: 73  Temp: 98 F (36.7 C)   There is no height or weight on file to calculate BMI.  Physical Exam Constitutional:      General: He is not in acute distress. Cardiovascular:     Rate and Rhythm: Normal rate and regular rhythm.     Heart sounds: No murmur.  Pulmonary:     Effort: Pulmonary effort is normal.     Breath sounds: Rhonchi present. No wheezing or rales.  Abdominal:     Palpations: Abdomen is soft.     Tenderness: There is no abdominal tenderness.  Neurological:     Mental Status: He is alert.  Psychiatric:        Mood and Affect: Mood normal.     Lab Results Sputum AFB culture 02/17/2019: Mycobacterium avium  Chest x-ray 02/17/2019 IMPRESSION: Apparent treated cavitary lesions in the left upper lobe and right perihilar regions. Cavitation is better appreciated in the left upper lobe lesion. No new opacities are evident. No adenopathy evident. Stable cardiac silhouette. Old rib trauma noted on the left with remodeling.  Problem List Items Addressed This Visit      High   Mycobacterium avium infection ([place])    Although his chest x-ray findings are stable over the past year his clinical exam suggest progressive Mycobacterium avium pneumonia.  I discussed treatment options with them and they are both in agreement with starting standard 3 drug therapy with azithromycin, ethambutol and rifampin.      Relevant Medications   azithromycin (ZITHROMAX) 500 MG tablet   rifampin (RIFADIN) 300 MG capsule   ethambutol (MYAMBUTOL) 400 MG tablet     Unprioritized   Cigarette nicotine dependence with  other nicotine-induced disorder    I encouraged him again to consider attempting to quit smoking.      Chronic hepatitis C without hepatic coma (HCC)    His repeat hepatitis C viral load was nondetectable compatible with cure from previous Harvoni therapy.      Relevant Medications   azithromycin (ZITHROMAX) 500 MG tablet   rifampin (RIFADIN) 300 MG capsule   ethambutol (MYAMBUTOL) 400 MG tablet   Alcoholism in remission [facility])    He tells me that he is an alcoholic does that it would be best  for him to reenter AA.  I asked him to call his PCP and discuss whether or not he should be on medication to help prevent alcohol withdrawal when he does decide to stop drinking.          [name], MD [facility] for Infectious [place] [facility] [phone] pager   [phone] cell 03/27/2019, 10:10 AM

## 2019-03-27 NOTE — Assessment & Plan Note (Signed)
He tells me that he is an alcoholic does that it would be best for him to reenter AA.  I asked him to call his PCP and discuss whether or not he should be on medication to help prevent alcohol withdrawal when he does decide to stop drinking.

## 2019-03-27 NOTE — Assessment & Plan Note (Signed)
I encouraged him again to consider attempting to quit smoking.

## 2019-03-27 NOTE — Assessment & Plan Note (Signed)
His repeat hepatitis C viral load was nondetectable compatible with cure from previous Harvoni therapy.

## 2019-03-27 NOTE — Assessment & Plan Note (Signed)
Although his chest x-ray findings are stable over the past year his clinical exam suggest progressive Mycobacterium avium pneumonia.  I discussed treatment options with them and they are both in agreement with starting standard 3 drug therapy with azithromycin, ethambutol and rifampin.

## 2019-03-29 ENCOUNTER — Other Ambulatory Visit: Payer: Self-pay | Admitting: Internal Medicine

## 2019-04-02 LAB — MYCOBACTERIA,CULT W/FLUOROCHROME SMEAR
MICRO NUMBER:: 597885
SPECIMEN QUALITY:: ADEQUATE

## 2019-04-03 ENCOUNTER — Telehealth: Payer: Self-pay | Admitting: *Deleted

## 2019-04-03 NOTE — Telephone Encounter (Signed)
Received call from [facility] regarding lab results. Patient being seen by Dr Megan Salon for [facility] treatment.   Component 74moago  MICRO NUMBER: [id number]  SPECIMEN QUALITY: Adequate   Source: SPUTUM   STATUS: FINAL   SMEAR: Rare (1 +) acid-fast bacilli seen using the fluorochrome method.Abnormal    ISOLATE 1: Mycobacterium, avium-intracellulare group Abnormal    Comment: DNA probe result positive for Mycobacterium avium complex  \[place] Droll, MLanice Schwab RN

## 2019-05-12 ENCOUNTER — Ambulatory Visit: Payer: Medicaid Other | Admitting: Internal Medicine

## 2019-06-17 ENCOUNTER — Encounter: Payer: Self-pay | Admitting: Internal Medicine

## 2019-06-17 ENCOUNTER — Ambulatory Visit (INDEPENDENT_AMBULATORY_CARE_PROVIDER_SITE_OTHER): Payer: Medicaid Other | Admitting: Internal Medicine

## 2019-06-17 ENCOUNTER — Other Ambulatory Visit: Payer: Self-pay

## 2019-06-17 DIAGNOSIS — A310 Pulmonary mycobacterial infection: Secondary | ICD-10-CM | POA: Diagnosis not present

## 2019-06-17 DIAGNOSIS — F17218 Nicotine dependence, cigarettes, with other nicotine-induced disorders: Secondary | ICD-10-CM | POA: Diagnosis not present

## 2019-06-17 DIAGNOSIS — F1021 Alcohol dependence, in remission: Secondary | ICD-10-CM | POA: Diagnosis not present

## 2019-06-17 LAB — COMPREHENSIVE METABOLIC PANEL
AG Ratio: 1.4 (calc) (ref 1.0–2.5)
ALT: 16 U/L (ref 9–46)
AST: 21 U/L (ref 10–35)
Albumin: 4.3 g/dL (ref 3.6–5.1)
Alkaline phosphatase (APISO): 84 U/L (ref 35–144)
BUN: 15 mg/dL (ref 7–25)
CO2: 34 mmol/L — ABNORMAL HIGH (ref 20–32)
Calcium: 10 mg/dL (ref 8.6–10.3)
Chloride: 103 mmol/L (ref 98–110)
Creat: 0.78 mg/dL (ref 0.70–1.33)
Globulin: 3 g/dL (calc) (ref 1.9–3.7)
Glucose, Bld: 90 mg/dL (ref 65–99)
Potassium: 4.7 mmol/L (ref 3.5–5.3)
Sodium: 142 mmol/L (ref 135–146)
Total Bilirubin: 0.7 mg/dL (ref 0.2–1.2)
Total Protein: 7.3 g/dL (ref 6.1–8.1)

## 2019-06-17 LAB — CBC
HCT: 46.4 % (ref 38.5–50.0)
Hemoglobin: 16 g/dL (ref 13.2–17.1)
MCH: 34.6 pg — ABNORMAL HIGH (ref 27.0–33.0)
MCHC: 34.5 g/dL (ref 32.0–36.0)
MCV: 100.2 fL — ABNORMAL HIGH (ref 80.0–100.0)
MPV: 10.3 fL (ref 7.5–12.5)
Platelets: 277 10*3/uL (ref 140–400)
RBC: 4.63 10*6/uL (ref 4.20–5.80)
RDW: 12 % (ref 11.0–15.0)
WBC: 8.5 10*3/uL (ref 3.8–10.8)

## 2019-06-17 NOTE — Assessment & Plan Note (Signed)
He has ongoing alcohol use.  I congratulated him for cutting down but asked him to consider going back to AA and quitting again.

## 2019-06-17 NOTE — Progress Notes (Signed)
[facility] for Infectious Disease  Patient Active Problem List   Diagnosis Date Noted  . 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 Gastritis and gastroduodenitis 01/10/2016  . Esophageal reflux 12/05/2015  . Colon cancer screening 10/07/2015  . Chronic hepatitis C without hepatic coma ([place]) 09/06/2015  . Alcoholism in remission ([place]) 08/03/2015  . Chronic obstructive pulmonary disease ([place]) 08/03/2015  . Cigarette nicotine dependence with other nicotine-induced disorder 08/03/2015    Patient's Medications  New Prescriptions   No medications on file  Previous Medications   ALBUTEROL (PROVENTIL HFA;VENTOLIN HFA) 108 (90 BASE) MCG/ACT INHALER    Inhale 2 puffs into the lungs every 6 (six) hours as needed for wheezing or shortness of breath.   ALBUTEROL (PROVENTIL) (2.5 MG/3ML) 0.083% NEBULIZER SOLUTION    Take 3 mLs (2.5 mg total) by nebulization every 6 (six) hours as needed for wheezing or shortness of breath.   AZITHROMYCIN (ZITHROMAX) 500 MG TABLET    Take 1 tablet (500 mg total) by mouth daily.   CYANOCOBALAMIN (VITAMIN B-12 PO)    Take 1 tablet by mouth daily.   ETHAMBUTOL (MYAMBUTOL) 400 MG TABLET    Take 3 tablets (1,200 mg total) by mouth daily.   IPRATROPIUM-ALBUTEROL (DUONEB) 0.5-2.5 (3) MG/3ML SOLN    Inhale 3 mLs into the lungs 4 (four) times daily.   LISINOPRIL (PRINIVIL,ZESTRIL) 10 MG TABLET    Take 1 tablet (10 mg total) by mouth daily.   RIFAMPIN (RIFADIN) 300 MG CAPSULE    Take 2 capsules (600 mg total) by mouth daily.   SYMBICORT 160-4.5 MCG/ACT INHALER    Inhale 1 puff into the lungs 2 (two) times daily.  Modified Medications   No medications on file  Discontinued Medications   AMOXICILLIN (AMOXIL) 500 MG CAPSULE    Take 500 mg by mouth 3 (three) times daily.    Subjective: Loise is in for his routine follow-up visit.  He started on azithromycin, ethambutol and rifampin right after his last visit on 03/27/2019.  He had  some nausea initially but this resolved spontaneously.  He is feeling better.  His appetite has improved and he has less fatigue.  His dyspnea on exertion is a little bit better.  He has not noted any change in his chronic cough productive of clear sputum.  He is still smoking over 1 pack of cigarettes daily.  He says that he has cut down on his heavy drinking and now only drinks "moderately".  Review of Systems: Review of Systems  Constitutional: Negative for chills, diaphoresis, fever, malaise/fatigue and weight loss.  HENT: Negative for congestion and sore throat.   Respiratory: Positive for cough, sputum production and shortness of breath. Negative for hemoptysis and wheezing.   Cardiovascular: Negative for chest pain.  Gastrointestinal: Negative for abdominal pain, diarrhea, heartburn, nausea and vomiting.  Musculoskeletal: Negative for back pain, joint pain and myalgias.  Neurological: Negative for headaches.  Psychiatric/Behavioral: Positive for substance abuse. Negative for depression.    Past Medical History:  Diagnosis Date  . Arthritis   . Asthma   . COPD (chronic obstructive pulmonary disease) ([place])   . Emphysema lung ([place])   . Glaucoma   . Hepatitis    C  . Hypertension   . Lung nodules    Bilateral  . Substance abuse ([place])   . Tuberculosis     Social History   Tobacco Use  . Smoking status: Current Every  Day Smoker    Packs/day: 1.00    Years: 40.00    Pack years: 40.00    Types: Cigarettes  . Smokeless tobacco: Never Used  Substance Use Topics  . Alcohol use: Yes    Comment: Drinks beer 2-3 times a week  . Drug use: Yes    Types: Marijuana, Cocaine    Comment: Remote cocaine use, Current marjiuana use    Family History  Problem Relation Age of Onset  . Cancer Mother        lymph nodes  . Ulcers Father   . Cholecystitis Father   . Colon cancer Neg Hx   . Colon polyps Neg Hx     No Known Allergies  Objective: Vitals:   06/17/19 0938  BP: (!)  162/95  Pulse: 89  Temp: 97.7 F (36.5 C)  Weight: 180 lb (81.6 kg)   Body mass index is 24.41 kg/m.  Physical Exam Constitutional:      Comments: He looks like he is feeling much better.  His weight is up 4 pounds.  Cardiovascular:     Rate and Rhythm: Normal rate and regular rhythm.     Heart sounds: No murmur.  Pulmonary:     Effort: Pulmonary effort is normal.     Breath sounds: Rhonchi present. No wheezing or rales.  Abdominal:     Palpations: Abdomen is soft.     Tenderness: There is no abdominal tenderness.  Musculoskeletal:        General: No swelling or tenderness.  Skin:    Coloration: Skin is not jaundiced.     Findings: No rash.  Neurological:     General: No focal deficit present.  Psychiatric:        Mood and Affect: Mood normal.     Lab Results    Problem List Items Addressed This Visit      High   Mycobacterium avium infection ([place])    He is improving clinically on therapy for Mycobacterium avium pneumonia.  I will obtain a repeat sputum specimen for culture and check blood work today.  He will follow-up in 1 month.      Relevant Orders   MYCOBACTERIA, CULTURE, WITH FLUOROCHROME SMEAR   Comprehensive metabolic panel   CBC     Unprioritized   Cigarette nicotine dependence with other nicotine-induced disorder    Encouraged him again to consider cutting down on his cigarettes.      Alcoholism in remission [facility])    He has ongoing alcohol use.  I congratulated him for cutting down but asked him to consider going back to AA and quitting again.          [name], MD [facility] for [place] [facility] [phone] pager   [phone] cell 06/17/2019, 9:58 AM

## 2019-06-17 NOTE — Assessment & Plan Note (Signed)
He is improving clinically on therapy for Mycobacterium avium pneumonia.  I will obtain a repeat sputum specimen for culture and check blood work today.  He will follow-up in 1 month.

## 2019-06-17 NOTE — Assessment & Plan Note (Signed)
Encouraged him again to consider cutting down on his cigarettes.

## 2019-07-17 ENCOUNTER — Ambulatory Visit (INDEPENDENT_AMBULATORY_CARE_PROVIDER_SITE_OTHER): Payer: Medicaid Other | Admitting: Internal Medicine

## 2019-07-17 ENCOUNTER — Other Ambulatory Visit: Payer: Self-pay

## 2019-07-17 ENCOUNTER — Encounter: Payer: Self-pay | Admitting: Internal Medicine

## 2019-07-17 DIAGNOSIS — A310 Pulmonary mycobacterial infection: Secondary | ICD-10-CM | POA: Diagnosis not present

## 2019-07-17 DIAGNOSIS — F17218 Nicotine dependence, cigarettes, with other nicotine-induced disorders: Secondary | ICD-10-CM

## 2019-07-17 DIAGNOSIS — B182 Chronic viral hepatitis C: Secondary | ICD-10-CM | POA: Diagnosis not present

## 2019-07-17 DIAGNOSIS — F1020 Alcohol dependence, uncomplicated: Secondary | ICD-10-CM

## 2019-07-17 NOTE — Assessment & Plan Note (Signed)
I talked to him again about the importance of cigarette cessation and asked him to consider cutting down.

## 2019-07-17 NOTE — Assessment & Plan Note (Signed)
Sputum remains culture positive but he is improving clinically.  I will check another sputum culture today and have him continue his current 3 drug regimen.  He will follow-up in 2 months.

## 2019-07-17 NOTE — Assessment & Plan Note (Signed)
His hepatitis C has been cured.

## 2019-07-17 NOTE — Progress Notes (Signed)
[facility] for Infectious Disease  Patient Active Problem List   Diagnosis Date Noted  . 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 Gastritis and gastroduodenitis 01/10/2016  . Esophageal reflux 12/05/2015  . Colon cancer screening 10/07/2015  . Chronic hepatitis C without hepatic coma ([place]) 09/06/2015  . Alcoholism ([place]) 08/03/2015  . Chronic obstructive pulmonary disease ([place]) 08/03/2015  . Cigarette nicotine dependence with other nicotine-induced disorder 08/03/2015    Patient's Medications  New Prescriptions   No medications on file  Previous Medications   ALBUTEROL (PROVENTIL HFA;VENTOLIN HFA) 108 (90 BASE) MCG/ACT INHALER    Inhale 2 puffs into the lungs every 6 (six) hours as needed for wheezing or shortness of breath.   ALBUTEROL (PROVENTIL) (2.5 MG/3ML) 0.083% NEBULIZER SOLUTION    Take 3 mLs (2.5 mg total) by nebulization every 6 (six) hours as needed for wheezing or shortness of breath.   AZITHROMYCIN (ZITHROMAX) 500 MG TABLET    Take 1 tablet (500 mg total) by mouth daily.   CYANOCOBALAMIN (VITAMIN B-12 PO)    Take 1 tablet by mouth daily.   ETHAMBUTOL (MYAMBUTOL) 400 MG TABLET    Take 3 tablets (1,200 mg total) by mouth daily.   IPRATROPIUM-ALBUTEROL (DUONEB) 0.5-2.5 (3) MG/3ML SOLN    Inhale 3 mLs into the lungs 4 (four) times daily.   LISINOPRIL (PRINIVIL,ZESTRIL) 10 MG TABLET    Take 1 tablet (10 mg total) by mouth daily.   RIFAMPIN (RIFADIN) 300 MG CAPSULE    Take 2 capsules (600 mg total) by mouth daily.   SYMBICORT 160-4.5 MCG/ACT INHALER    Inhale 1 puff into the lungs 2 (two) times daily.  Modified Medications   No medications on file  Discontinued Medications   No medications on file    Subjective: Derek [name] is in for his routine follow-up visit.  He started on azithromycin, ethambutol and rifampin right on 03/27/2019.  He has had some intermittent bloating and soft stool but otherwise is tolerating his medications  reasonably well.  He is feeling better.  His appetite has improved and he has less fatigue.  His dyspnea on exertion is a little bit better.  He feels like he is still smoking about 1 pack of cigarettes daily.  He says that he has cut down on his heavy drinking.  He is me that a friend told him that drinking too much alcohol could be bad for his liver.  Review of Systems: Review of Systems  Constitutional: Negative for chills, diaphoresis, fever, malaise/fatigue and weight loss.  HENT: Negative for congestion and sore throat.   Respiratory: Positive for cough, sputum production and shortness of breath. Negative for hemoptysis and wheezing.   Cardiovascular: Negative for chest pain.  Gastrointestinal: Negative for abdominal pain, diarrhea, heartburn, nausea and vomiting.       As noted in HPI.  Musculoskeletal: Negative for back pain, joint pain and myalgias.  Neurological: Negative for headaches.  Psychiatric/Behavioral: Negative for depression.    Past Medical History:  Diagnosis Date  . Arthritis   . Asthma   . COPD (chronic obstructive pulmonary disease) ([place])   . Emphysema lung ([place])   . Glaucoma   . Hepatitis    C  . Hypertension   . Lung nodules    Bilateral  . Substance abuse ([place])   . Tuberculosis     Social History   Tobacco Use  . Smoking status: Current Every Day Smoker  Packs/day: 1.00    Years: 40.00    Pack years: 40.00    Types: Cigarettes  . Smokeless tobacco: Never Used  Substance Use Topics  . Alcohol use: Yes    Comment: Drinks beer 2-3 times a week  . Drug use: Yes    Types: Marijuana, Cocaine    Comment: Remote cocaine use, Current marjiuana use    Family History  Problem Relation Age of Onset  . Cancer Mother        lymph nodes  . Ulcers Father   . Cholecystitis Father   . Colon cancer Neg Hx   . Colon polyps Neg Hx     No Known Allergies  Objective: Vitals:   07/17/19 1019  BP: (!) 160/105  Pulse: 76  Temp: 97.9 F (36.6 C)   Weight: 178 lb 9.6 oz (81 kg)   Body mass index is 24.22 kg/m.  Physical Exam Constitutional:      Comments: His weight is stable.  He is more talkative and in good spirits.  Cardiovascular:     Rate and Rhythm: Normal rate and regular rhythm.     Heart sounds: No murmur.  Pulmonary:     Effort: Pulmonary effort is normal.     Breath sounds: Wheezing present. No rhonchi or rales.     Comments: Scattered, faint expiratory wheezing. Abdominal:     Palpations: Abdomen is soft.     Tenderness: There is no abdominal tenderness.  Musculoskeletal:        General: No swelling or tenderness.  Skin:    Coloration: Skin is not jaundiced.     Findings: No rash.  Neurological:     General: No focal deficit present.  Psychiatric:        Mood and Affect: Mood normal.     Lab Results Repeat sputum culture 06/17/2019 + for Mycobacterium avium again.  CMP     Component Value Date/Time   NA 142 06/17/2019 0959   K 4.7 06/17/2019 0959   CL 103 06/17/2019 0959   CO2 34 (H) 06/17/2019 0959   GLUCOSE 90 06/17/2019 0959   BUN 15 06/17/2019 0959   CREATININE 0.78 06/17/2019 0959   CALCIUM 10.0 06/17/2019 0959   PROT 7.3 06/17/2019 0959   ALBUMIN 3.9 10/31/2017 1141   AST 21 06/17/2019 0959   ALT 16 06/17/2019 0959   ALKPHOS 106 10/31/2017 1141   BILITOT 0.7 06/17/2019 0959   GFRNONAA >60 10/31/2017 1141   GFRNONAA >89 09/20/2016 0905   GFRAA >60 10/31/2017 1141   GFRAA >89 09/20/2016 0905   Lab Results  Component Value Date   WBC 8.5 06/17/2019   HGB 16.0 06/17/2019   HCT 46.4 06/17/2019   MCV 100.2 (H) 06/17/2019   PLT 277 06/17/2019  Hepatitis C viral load undetectable   Problem List Items Addressed This Visit      High   Mycobacterium avium infection ([place])    Sputum remains culture positive but he is improving clinically.  I will check another sputum culture today and have him continue his current 3 drug regimen.  He will follow-up in 2 months.      Relevant Orders    MYCOBACTERIA, CULTURE, WITH FLUOROCHROME SMEAR     Unprioritized   Cigarette nicotine dependence with other nicotine-induced disorder    I talked to him again about the importance of cigarette cessation and asked him to consider cutting down.      Chronic hepatitis C without hepatic coma (HCC)  His hepatitis C has been cured.      Alcoholism ([place])    I congratulated him on his efforts to cut down on his drinking.          Derek Bickers, MD [facility] for Infectious [place] [facility] [phone] pager   [phone] cell 07/17/2019, 10:43 AM

## 2019-07-17 NOTE — Assessment & Plan Note (Signed)
I congratulated him on his efforts to cut down on his drinking.

## 2019-07-28 ENCOUNTER — Other Ambulatory Visit: Payer: Self-pay

## 2019-07-28 ENCOUNTER — Other Ambulatory Visit: Payer: Medicaid Other

## 2019-07-28 ENCOUNTER — Other Ambulatory Visit: Payer: Self-pay | Admitting: *Deleted

## 2019-07-28 DIAGNOSIS — A310 Pulmonary mycobacterial infection: Secondary | ICD-10-CM

## 2019-07-30 LAB — MYCOBACTERIA,CULT W/FLUOROCHROME SMEAR
MICRO NUMBER:: 1013279
SMEAR:: NONE SEEN
SPECIMEN QUALITY:: ADEQUATE

## 2019-08-14 ENCOUNTER — Telehealth: Payer: Self-pay

## 2019-08-14 NOTE — Telephone Encounter (Signed)
Contains abnormal data MYCOBACTERIA, CULTURE, WITH FLUOROCHROME SMEAR Order: [id number] Status:  Preliminary result Visible to patient:  No (not released) Next appt:  09/18/2019 at 10:30 AM in Infectious Diseases [name], MD) Dx:  Mycobacterium avium infection [name]) Specimen Information: Sputum     Component 2 wk ago  MICRO NUMBER: [id number] P   SPECIMEN QUALITY: Adequate P   Source: SPUTUM P   STATUS: PRELIMINARY P   SMEAR: Rare (1 +) acid-fast bacilli seen using the fluorochrome method.Abnormal  P   ISOLATE 1: Mycobacterium, avium-intracellulare group Abnormal  P   Comment: DNA probe result positive for Mycobacterium avium complex       Derek [name]

## 2019-09-18 ENCOUNTER — Ambulatory Visit: Payer: Medicaid Other | Admitting: Internal Medicine

## 2019-09-23 ENCOUNTER — Ambulatory Visit (INDEPENDENT_AMBULATORY_CARE_PROVIDER_SITE_OTHER): Payer: Medicaid Other | Admitting: Internal Medicine

## 2019-09-23 ENCOUNTER — Other Ambulatory Visit: Payer: Self-pay

## 2019-09-23 DIAGNOSIS — R221 Localized swelling, mass and lump, neck: Secondary | ICD-10-CM

## 2019-09-23 DIAGNOSIS — A310 Pulmonary mycobacterial infection: Secondary | ICD-10-CM

## 2019-09-23 NOTE — Assessment & Plan Note (Signed)
He is improving clinically on therapy for Mycobacterium avium pneumonia.  His sputum culture remain positive last November.  I will have him collect another sputum sample today for culture and have him continue his current 3 drug regimen.  He will follow-up here in 3 months

## 2019-09-23 NOTE — Assessment & Plan Note (Signed)
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

## 2019-09-23 NOTE — Progress Notes (Signed)
[facility] for Infectious Disease  Patient Active Problem List   Diagnosis Date Noted  . 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 Neck mass 09/23/2019  . Gastritis and gastroduodenitis 01/10/2016  . Esophageal reflux 12/05/2015  . Colon cancer screening 10/07/2015  . Chronic hepatitis C without hepatic coma ([place]) 09/06/2015  . Alcoholism ([place]) 08/03/2015  . Chronic obstructive pulmonary disease ([place]) 08/03/2015  . Cigarette nicotine dependence with other nicotine-induced disorder 08/03/2015    Patient's Medications  New Prescriptions   No medications on file  Previous Medications   ALBUTEROL (PROVENTIL HFA;VENTOLIN HFA) 108 (90 BASE) MCG/ACT INHALER    Inhale 2 puffs into the lungs every 6 (six) hours as needed for wheezing or shortness of breath.   ALBUTEROL (PROVENTIL) (2.5 MG/3ML) 0.083% NEBULIZER SOLUTION    Take 3 mLs (2.5 mg total) by nebulization every 6 (six) hours as needed for wheezing or shortness of breath.   AZITHROMYCIN (ZITHROMAX) 500 MG TABLET    Take 1 tablet (500 mg total) by mouth daily.   CYANOCOBALAMIN (VITAMIN B-12 PO)    Take 1 tablet by mouth daily.   ETHAMBUTOL (MYAMBUTOL) 400 MG TABLET    Take 3 tablets (1,200 mg total) by mouth daily.   IPRATROPIUM-ALBUTEROL (DUONEB) 0.5-2.5 (3) MG/3ML SOLN    Inhale 3 mLs into the lungs 4 (four) times daily.   LISINOPRIL (PRINIVIL,ZESTRIL) 10 MG TABLET    Take 1 tablet (10 mg total) by mouth daily.   RIFAMPIN (RIFADIN) 300 MG CAPSULE    Take 2 capsules (600 mg total) by mouth daily.   SYMBICORT 160-4.5 MCG/ACT INHALER    Inhale 1 puff into the lungs 2 (two) times daily.  Modified Medications   No medications on file  Discontinued Medications   No medications on file    Subjective: Robbin is in for his routine follow-up visit.  He started on azithromycin, ethambutol and rifampin right on 03/27/2019.  He is tolerating his medications reasonably well.  He is feeling better.  He  says he feels much stronger and less short of breath.  His chronic cough is unchanged but he feels like the sputum he brings up is less thick.  He has not had any hemoptysis.  He is still smoking about a pack of cigarettes daily and does not feel that he can quit.  He rates his alcohol consumption as moderate.  Review of Systems: Review of Systems  Constitutional: Negative for chills, diaphoresis, fever, malaise/fatigue and weight loss.  HENT: Negative for congestion and sore throat.        He is scheduled for several dental extractions next week and also aspiration a long-term right neck mass that has been present for about 8 years.  Respiratory: Positive for cough, sputum production and shortness of breath. Negative for hemoptysis and wheezing.   Cardiovascular: Negative for chest pain.  Gastrointestinal: Negative for abdominal pain, diarrhea, heartburn, nausea and vomiting.       As noted in HPI.  Musculoskeletal: Negative for back pain, joint pain and myalgias.  Neurological: Negative for headaches.  Psychiatric/Behavioral: Negative for depression.    Past Medical History:  Diagnosis Date  . Arthritis   . Asthma   . COPD (chronic obstructive pulmonary disease) ([place])   . Emphysema lung ([place])   . Glaucoma   . Hepatitis    C  . Hypertension   . Lung nodules    Bilateral  . Substance  abuse ([place])   . Tuberculosis     Social History   Tobacco Use  . Smoking status: Current Every Day Smoker    Packs/day: 1.00    Years: 40.00    Pack years: 40.00    Types: Cigarettes  . Smokeless tobacco: Never Used  Substance Use Topics  . Alcohol use: Yes    Comment: Drinks beer 2-3 times a week  . Drug use: Yes    Types: Marijuana, Cocaine    Comment: Remote cocaine use, Current marjiuana use    Family History  Problem Relation Age of Onset  . Cancer Mother        lymph nodes  . Ulcers Father   . Cholecystitis Father   . Colon cancer Neg Hx   . Colon polyps Neg Hx     No  Known Allergies  Objective: Vitals:   09/23/19 0926  BP: (!) 151/99  Pulse: 84  Temp: 98.4 F (36.9 C)  TempSrc: Oral  SpO2: 94%  Weight: 176 lb (79.8 kg)   Body mass index is 23.87 kg/m.  Physical Exam Constitutional:      Comments: His weight is stable.  He is in good spirits.  Neck:     Comments: He has a very soft, superficial right neck mass about the size of a ping-pong ball. Cardiovascular:     Rate and Rhythm: Normal rate and regular rhythm.     Heart sounds: No murmur.  Pulmonary:     Effort: Pulmonary effort is normal.     Breath sounds: No wheezing, rhonchi or rales.     Comments: His lungs are clear today. Abdominal:     Palpations: Abdomen is soft.     Tenderness: There is no abdominal tenderness.  Musculoskeletal:        General: No swelling or tenderness.  Skin:    Coloration: Skin is not jaundiced.     Findings: No rash.  Neurological:     General: No focal deficit present.  Psychiatric:        Mood and Affect: Mood normal.     Lab Results Repeat sputum culture 06/17/2019 + for Mycobacterium avium again.  CMP     Component Value Date/Time   NA 142 06/17/2019 0959   K 4.7 06/17/2019 0959   CL 103 06/17/2019 0959   CO2 34 (H) 06/17/2019 0959   GLUCOSE 90 06/17/2019 0959   BUN 15 06/17/2019 0959   CREATININE 0.78 06/17/2019 0959   CALCIUM 10.0 06/17/2019 0959   PROT 7.3 06/17/2019 0959   ALBUMIN 3.9 10/31/2017 1141   AST 21 06/17/2019 0959   ALT 16 06/17/2019 0959   ALKPHOS 106 10/31/2017 1141   BILITOT 0.7 06/17/2019 0959   GFRNONAA >60 10/31/2017 1141   GFRNONAA >89 09/20/2016 0905   GFRAA >60 10/31/2017 1141   GFRAA >89 09/20/2016 0905   Lab Results  Component Value Date   WBC 8.5 06/17/2019   HGB 16.0 06/17/2019   HCT 46.4 06/17/2019   MCV 100.2 (H) 06/17/2019   PLT 277 06/17/2019  Hepatitis C viral load undetectable   Problem List Items Addressed This Visit      High   Mycobacterium avium infection ([place])    He is  improving clinically on therapy for Mycobacterium avium pneumonia.  His sputum culture remain positive last November.  I will have him collect another sputum sample today for culture and have him continue his current 3 drug regimen.  He will follow-up here in 3  months      Relevant Orders   MYCOBACTERIA, CULTURE, WITH FLUOROCHROME SMEAR     Unprioritized   Neck mass    I suspect that he has a benign lipoma but agree with having it evaluated with aspiration/biopsy.          [name], MD [facility] for Infectious [place] [facility] [phone] pager   [phone] cell 09/23/2019, 9:41 AM

## 2019-09-30 LAB — M. AVIUM MIC PANEL
AMIKACIN: 32
CIPROFLOXACIN: >16
CLARITHROMYCIN: 2
ETHAMBUTOL: 8
ETHIONAMIDE: 20
ISONIAZID: 8
LINEZOLID: 32
MOXIFLOXACIN: 4
RIFABUTIN: 1
RIFAMPIN: 8
STREPTOMYCIN: >64

## 2019-09-30 LAB — MYCOBACTERIA,CULT W/FLUOROCHROME SMEAR
MICRO NUMBER:: 1150278
SPECIMEN QUALITY:: ADEQUATE

## 2019-11-05 LAB — MYCOBACTERIA,CULT W/FLUOROCHROME SMEAR
MICRO NUMBER:: 10085647
SMEAR:: NONE SEEN
SPECIMEN QUALITY:: ADEQUATE

## 2019-12-23 ENCOUNTER — Ambulatory Visit: Payer: Medicaid Other | Admitting: Internal Medicine

## 2020-01-07 ENCOUNTER — Ambulatory Visit: Payer: Medicaid Other | Admitting: Internal Medicine

## 2020-01-14 ENCOUNTER — Other Ambulatory Visit: Payer: Self-pay

## 2020-01-14 ENCOUNTER — Encounter: Payer: Self-pay | Admitting: Internal Medicine

## 2020-01-14 ENCOUNTER — Ambulatory Visit (INDEPENDENT_AMBULATORY_CARE_PROVIDER_SITE_OTHER): Payer: Medicaid Other | Admitting: Internal Medicine

## 2020-01-14 DIAGNOSIS — A310 Pulmonary mycobacterial infection: Secondary | ICD-10-CM | POA: Diagnosis present

## 2020-01-14 DIAGNOSIS — F1020 Alcohol dependence, uncomplicated: Secondary | ICD-10-CM | POA: Diagnosis not present

## 2020-01-14 DIAGNOSIS — J42 Unspecified chronic bronchitis: Secondary | ICD-10-CM

## 2020-01-14 NOTE — Assessment & Plan Note (Signed)
He is currently not ready to attempt quitting cigarettes.  I did talk to him about Covid vaccination and strongly recommended that he be vaccinated as soon as possible.

## 2020-01-14 NOTE — Assessment & Plan Note (Signed)
His sputum culture in January was still positive for Mycobacterium avium and I strongly suspect his infection is still active today given that he has not been taking his medications.  I told him that it would be very dangerous for him to take antibiotics intermittently because of the rapid emergence of multidrug resistance.  We will repeat his sputum culture today and obtain lab work and see him back in 6 weeks to see if he is ready to restart therapy.

## 2020-01-14 NOTE — Assessment & Plan Note (Signed)
He used to attend AA meetings and said that was very helpful getting him to cut down on his drinking.  He is willing to consider attending meetings again.

## 2020-01-14 NOTE — Progress Notes (Signed)
[facility] for Infectious Disease  Patient Active Problem List   Diagnosis Date Noted  . 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 Neck mass 09/23/2019  . Gastritis and gastroduodenitis 01/10/2016  . Esophageal reflux 12/05/2015  . Colon cancer screening 10/07/2015  . Chronic hepatitis C without hepatic coma ([place]) 09/06/2015  . Alcoholism ([place]) 08/03/2015  . Chronic obstructive pulmonary disease ([place]) 08/03/2015  . Cigarette nicotine dependence with other nicotine-induced disorder 08/03/2015    Patient's Medications  New Prescriptions   No medications on file  Previous Medications   ALBUTEROL (PROVENTIL HFA;VENTOLIN HFA) 108 (90 BASE) MCG/ACT INHALER    Inhale 2 puffs into the lungs every 6 (six) hours as needed for wheezing or shortness of breath.   ALBUTEROL (PROVENTIL) (2.5 MG/3ML) 0.083% NEBULIZER SOLUTION    Take 3 mLs (2.5 mg total) by nebulization every 6 (six) hours as needed for wheezing or shortness of breath.   AZITHROMYCIN (ZITHROMAX) 500 MG TABLET    Take 1 tablet (500 mg total) by mouth daily.   CYANOCOBALAMIN (VITAMIN B-12 PO)    Take 1 tablet by mouth daily.   ETHAMBUTOL (MYAMBUTOL) 400 MG TABLET    Take 3 tablets (1,200 mg total) by mouth daily.   IPRATROPIUM-ALBUTEROL (DUONEB) 0.5-2.5 (3) MG/3ML SOLN    Inhale 3 mLs into the lungs 4 (four) times daily.   LISINOPRIL (PRINIVIL,ZESTRIL) 10 MG TABLET    Take 1 tablet (10 mg total) by mouth daily.   RIFAMPIN (RIFADIN) 300 MG CAPSULE    Take 2 capsules (600 mg total) by mouth daily.   SYMBICORT 160-4.5 MCG/ACT INHALER    Inhale 1 puff into the lungs 2 (two) times daily.  Modified Medications   No medications on file  Discontinued Medications   No medications on file    Subjective: Derek [name] is in for his routine follow-up visit.  He started on azithromycin, ethambutol and rifampin right on 03/27/2019.  He was tolerating his medications reasonably well but tells me that he stopped  taking them several weeks ago when he started drinking alcohol heavily again.  He says that he drank a case of beer last Saturday. His chronic cough is a little worse recently.  His sputum remains clear.  He has not had any hemoptysis.  He is still smoking about a pack of cigarettes daily and does not feel that he can quit.  He has not had Covid vaccination yet.  Review of Systems: Review of Systems  Constitutional: Negative for chills, diaphoresis, fever, malaise/fatigue and weight loss.  HENT: Negative for congestion and sore throat.   Respiratory: Positive for cough, sputum production and shortness of breath. Negative for hemoptysis and wheezing.   Cardiovascular: Negative for chest pain.  Gastrointestinal: Negative for abdominal pain, diarrhea, heartburn, nausea and vomiting.  Musculoskeletal: Negative for back pain, joint pain and myalgias.  Neurological: Negative for headaches.  Psychiatric/Behavioral: Positive for substance abuse. Negative for depression.    Past Medical History:  Diagnosis Date  . Arthritis   . Asthma   . COPD (chronic obstructive pulmonary disease) ([place])   . Emphysema lung ([place])   . Glaucoma   . Hepatitis    C  . Hypertension   . Lung nodules    Bilateral  . Substance abuse ([place])   . Tuberculosis     Social History   Tobacco Use  . Smoking status: Current Every Day Smoker    Packs/day: 1.00  Years: 40.00    Pack years: 40.00    Types: Cigarettes  . Smokeless tobacco: Never Used  Substance Use Topics  . Alcohol use: Yes    Comment: Drinks beer 2-3 times a week  . Drug use: Yes    Types: Marijuana, Cocaine    Comment: Remote cocaine use, Current marjiuana use    Family History  Problem Relation Age of Onset  . Cancer Mother        lymph nodes  . Ulcers Father   . Cholecystitis Father   . Colon cancer Neg Hx   . Colon polyps Neg Hx     No Known Allergies  Objective: Vitals:   01/14/20 1023  BP: (!) 153/95  Pulse: 74  Temp: 98.2  F (36.8 C)  SpO2: 95%  Weight: 173 lb (78.5 kg)   Body mass index is 23.46 kg/m.  Physical Exam Constitutional:      Comments: His weight is stable.  He is in good spirits.  Neck:     Comments: He has a very soft, superficial right neck mass about the size of a ping-pong ball. Cardiovascular:     Rate and Rhythm: Normal rate and regular rhythm.     Heart sounds: No murmur.  Pulmonary:     Effort: Pulmonary effort is normal.     Breath sounds: Wheezing and rhonchi present. No rales.  Abdominal:     Palpations: Abdomen is soft.     Tenderness: There is no abdominal tenderness.  Musculoskeletal:        General: No swelling or tenderness.  Skin:    Coloration: Skin is not jaundiced.     Findings: No rash.  Neurological:     General: No focal deficit present.  Psychiatric:        Mood and Affect: Mood normal.     Lab Results Repeat sputum culture 06/17/2019 + for Mycobacterium avium again.  CMP     Component Value Date/Time   NA 142 06/17/2019 0959   K 4.7 06/17/2019 0959   CL 103 06/17/2019 0959   CO2 34 (H) 06/17/2019 0959   GLUCOSE 90 06/17/2019 0959   BUN 15 06/17/2019 0959   CREATININE 0.78 06/17/2019 0959   CALCIUM 10.0 06/17/2019 0959   PROT 7.3 06/17/2019 0959   ALBUMIN 3.9 10/31/2017 1141   AST 21 06/17/2019 0959   ALT 16 06/17/2019 0959   ALKPHOS 106 10/31/2017 1141   BILITOT 0.7 06/17/2019 0959   GFRNONAA >60 10/31/2017 1141   GFRNONAA >89 09/20/2016 0905   GFRAA >60 10/31/2017 1141   GFRAA >89 09/20/2016 0905   Lab Results  Component Value Date   WBC 8.5 06/17/2019   HGB 16.0 06/17/2019   HCT 46.4 06/17/2019   MCV 100.2 (H) 06/17/2019   PLT 277 06/17/2019  Hepatitis C viral load undetectable   Problem List Items Addressed This Visit      High   Mycobacterium avium infection ([place])    His sputum culture in January was still positive for Mycobacterium avium and I strongly suspect his infection is still active today given that he has not  been taking his medications.  I told him that it would be very dangerous for him to take antibiotics intermittently because of the rapid emergence of multidrug resistance.  We will repeat his sputum culture today and obtain lab work and see him back in 6 weeks to see if he is ready to restart therapy.      Relevant Orders  CBC   Comprehensive metabolic panel   MYCOBACTERIA, CULTURE, WITH FLUOROCHROME SMEAR     Unprioritized   Chronic obstructive pulmonary disease ([place])    He is currently not ready to attempt quitting cigarettes.  I did talk to him about Covid vaccination and strongly recommended that he be vaccinated as soon as possible.      Alcoholism [facility])    He used to attend AA meetings and said that was very helpful getting him to cut down on his drinking.  He is willing to consider attending meetings again.          Derek Bickers, MD [facility] for Infectious [place] [facility] [phone] pager   [phone] cell 01/14/2020, 10:44 AM

## 2020-02-29 LAB — COMPREHENSIVE METABOLIC PANEL
AG Ratio: 1.3 (calc) (ref 1.0–2.5)
ALT: 17 U/L (ref 9–46)
AST: 20 U/L (ref 10–35)
Albumin: 4 g/dL (ref 3.6–5.1)
Alkaline phosphatase (APISO): 109 U/L (ref 35–144)
BUN: 11 mg/dL (ref 7–25)
CO2: 31 mmol/L (ref 20–32)
Calcium: 9.9 mg/dL (ref 8.6–10.3)
Chloride: 102 mmol/L (ref 98–110)
Creat: 0.96 mg/dL (ref 0.70–1.33)
Globulin: 3 g/dL (calc) (ref 1.9–3.7)
Glucose, Bld: 95 mg/dL (ref 65–99)
Potassium: 4.8 mmol/L (ref 3.5–5.3)
Sodium: 140 mmol/L (ref 135–146)
Total Bilirubin: 0.3 mg/dL (ref 0.2–1.2)
Total Protein: 7 g/dL (ref 6.1–8.1)

## 2020-02-29 LAB — M. AVIUM MIC PANEL
AMIKACIN: 32
CIPROFLOXACIN: >16
CLARITHROMYCIN: 2
ETHAMBUTOL: 8
ETHIONAMIDE: 20
ISONIAZID: 8
LINEZOLID: 32
MOXIFLOXACIN: 8
RIFABUTIN: 0.5
RIFAMPIN: 8
STREPTOMYCIN: 64

## 2020-02-29 LAB — CBC
HCT: 46.1 % (ref 38.5–50.0)
Hemoglobin: 15.8 g/dL (ref 13.2–17.1)
MCH: 35 pg — ABNORMAL HIGH (ref 27.0–33.0)
MCHC: 34.3 g/dL (ref 32.0–36.0)
MCV: 102 fL — ABNORMAL HIGH (ref 80.0–100.0)
MPV: 10.5 fL (ref 7.5–12.5)
Platelets: 305 10*3/uL (ref 140–400)
RBC: 4.52 10*6/uL (ref 4.20–5.80)
RDW: 12.3 % (ref 11.0–15.0)
WBC: 9.9 10*3/uL (ref 3.8–10.8)

## 2020-02-29 LAB — MYCOBACTERIA,CULT W/FLUOROCHROME SMEAR
MICRO NUMBER:: 10499511
SPECIMEN QUALITY:: ADEQUATE

## 2020-03-04 ENCOUNTER — Ambulatory Visit: Payer: Medicaid Other | Admitting: Internal Medicine

## 2020-11-01 ENCOUNTER — Encounter: Payer: Self-pay | Admitting: Internal Medicine

## 2021-03-09 ENCOUNTER — Ambulatory Visit: Payer: Medicaid Other | Admitting: Pharmacist

## 2021-03-09 ENCOUNTER — Ambulatory Visit (INDEPENDENT_AMBULATORY_CARE_PROVIDER_SITE_OTHER): Payer: Medicaid Other | Admitting: Internal Medicine

## 2021-03-09 ENCOUNTER — Other Ambulatory Visit: Payer: Self-pay

## 2021-03-09 ENCOUNTER — Ambulatory Visit: Payer: Medicaid Other | Admitting: Internal Medicine

## 2021-03-09 ENCOUNTER — Encounter: Payer: Self-pay | Admitting: Internal Medicine

## 2021-03-09 DIAGNOSIS — U071 COVID-19: Secondary | ICD-10-CM | POA: Diagnosis present

## 2021-03-09 DIAGNOSIS — F1020 Alcohol dependence, uncomplicated: Secondary | ICD-10-CM | POA: Diagnosis not present

## 2021-03-09 DIAGNOSIS — A310 Pulmonary mycobacterial infection: Secondary | ICD-10-CM | POA: Diagnosis not present

## 2021-03-09 LAB — COMPREHENSIVE METABOLIC PANEL
AG Ratio: 1.2 (calc) (ref 1.0–2.5)
ALT: 23 U/L (ref 9–46)
AST: 26 U/L (ref 10–35)
Albumin: 3.8 g/dL (ref 3.6–5.1)
Alkaline phosphatase (APISO): 104 U/L (ref 35–144)
BUN/Creatinine Ratio: 10 (calc) (ref 6–22)
BUN: 7 mg/dL (ref 7–25)
CO2: 32 mmol/L (ref 20–32)
Calcium: 9.5 mg/dL (ref 8.6–10.3)
Chloride: 103 mmol/L (ref 98–110)
Creat: 0.68 mg/dL — ABNORMAL LOW (ref 0.70–1.35)
Globulin: 3.2 g/dL (calc) (ref 1.9–3.7)
Glucose, Bld: 96 mg/dL (ref 65–99)
Potassium: 4.7 mmol/L (ref 3.5–5.3)
Sodium: 144 mmol/L (ref 135–146)
Total Bilirubin: 0.3 mg/dL (ref 0.2–1.2)
Total Protein: 7 g/dL (ref 6.1–8.1)

## 2021-03-09 NOTE — Progress Notes (Signed)
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    Priority: High   Neck mass 09/23/2019   Gastritis and gastroduodenitis 01/10/2016   Esophageal reflux 12/05/2015   Colon cancer screening 10/07/2015   Chronic hepatitis C without hepatic coma ([place]) 09/06/2015   Alcoholism ([place]) 08/03/2015   Chronic obstructive pulmonary disease ([place]) 08/03/2015   Cigarette nicotine dependence with other nicotine-induced disorder 08/03/2015    Patient's Medications  New Prescriptions   No medications on file  Previous Medications   ALBUTEROL (PROVENTIL HFA;VENTOLIN HFA) 108 (90 BASE) MCG/ACT INHALER    Inhale 2 puffs into the lungs every 6 (six) hours as needed for wheezing or shortness of breath.   ALBUTEROL (PROVENTIL) (2.5 MG/3ML) 0.083% NEBULIZER SOLUTION    Take 3 mLs (2.5 mg total) by nebulization every 6 (six) hours as needed for wheezing or shortness of breath.   AZITHROMYCIN (ZITHROMAX) 500 MG TABLET    Take 1 tablet (500 mg total) by mouth daily.   CYANOCOBALAMIN (VITAMIN B-12 PO)    Take 1 tablet by mouth daily.   ETHAMBUTOL (MYAMBUTOL) 400 MG TABLET    Take 3 tablets (1,200 mg total) by mouth daily.   IPRATROPIUM-ALBUTEROL (DUONEB) 0.5-2.5 (3) MG/3ML SOLN    Inhale 3 mLs into the lungs 4 (four) times daily.   LISINOPRIL (PRINIVIL,ZESTRIL) 10 MG TABLET    Take 1 tablet (10 mg total) by mouth daily.   RIFAMPIN (RIFADIN) 300 MG CAPSULE    Take 2 capsules (600 mg total) by mouth daily.   SYMBICORT 160-4.5 MCG/ACT INHALER    Inhale 1 puff into the lungs 2 (two) times daily.  Modified Medications   No medications on file  Discontinued Medications   No medications on file    Subjective: Tukker is in for her an ED follow-up visit.  When I saw him last year he had stopped taking his azithromycin, ethambutol and rifampin because he had started drinking  alcohol heavily again.  He took the antibiotics from July 2020 until April 2021.  He had a chest x-ray this past May which showed a new nodule in his right hilum.  It does not appear that that was followed up with any further diagnostic testing.  He developed worsening cough and shortness of breath several weeks ago was seen in the ED at [facility].  He tested positive for COVID there.  His chest x-ray did not show any acute changes at that time.  Hospitalization was recommended but he did not feel like he was sick enough to be admitted and he signed himself out.  He felt better after 3 days.  He states that he is still drinking heavily usually beer.  He is still smoking 1 to 2 cigarettes daily.  Review of Systems: Review of Systems  Constitutional:  Negative for fever and weight loss.  Respiratory:  Positive for cough, sputum production, shortness of breath and wheezing. Negative for hemoptysis.   Cardiovascular:  Negative for chest pain.  Gastrointestinal:  Positive for diarrhea. Negative for abdominal pain, nausea and vomiting.  Psychiatric/Behavioral:  Negative for depression.    Past Medical History:  Diagnosis Date   Arthritis    Asthma    COPD (chronic obstructive pulmonary disease) ([facility])    Emphysema lung ([facility])    Glaucoma    Hepatitis  C   Hypertension    Lung nodules    Bilateral   Substance abuse ([place])    Tuberculosis     Social History   Tobacco Use   Smoking status: Every Day    Packs/day: 1.00    Years: 40.00    Pack years: 40.00    Types: Cigarettes   Smokeless tobacco: Never  Vaping Use   Vaping Use: Former  Substance Use Topics   Alcohol use: Yes    Comment: Drinks beer 2-3 times a week   Drug use: Yes    Types: Marijuana, Cocaine    Comment: Remote cocaine use, Current marjiuana use    Family History  Problem Relation Age of Onset   Cancer Mother        lymph nodes   Ulcers Father    Cholecystitis Father    Colon cancer Neg Hx    Colon  polyps Neg Hx     No Known Allergies  Objective: Vitals:   03/09/21 0920  BP: (!) 148/92  Pulse: 91  Temp: 98 F (36.7 C)  TempSrc: Oral  SpO2: 92%  Weight: 178 lb 9.6 oz (81 kg)   Body mass index is 24.22 kg/m.  Physical Exam Constitutional:      Comments: He is in no distress.  Cardiovascular:     Rate and Rhythm: Normal rate and regular rhythm.     Heart sounds: No murmur heard. Pulmonary:     Effort: Pulmonary effort is normal.     Breath sounds: Wheezing and rhonchi present. No rales.  Abdominal:     Palpations: Abdomen is soft.     Tenderness: There is no abdominal tenderness.  Skin:    Comments: Scattered ecchymoses on his arms and hands.  Psychiatric:        Mood and Affect: Mood normal.    Lab Results    Problem List Items Addressed This Visit       High   Mycobacterium avium infection ([place])    I will obtain sputum for a repeat AFB smear and culture then arrange follow-up in 2 weeks.       Relevant Orders   MYCOBACTERIA, CULTURE, WITH FLUOROCHROME SMEAR   Comprehensive metabolic panel   [id number]    Amazingly enough, he seems to have recovered uneventfully from COVID infection.         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         [name], MD [facility] for Infectious [place] [facility] [phone] pager   [phone] cell 03/09/2021, 9:39 AM

## 2021-03-09 NOTE — Assessment & Plan Note (Signed)
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

## 2021-03-09 NOTE — Assessment & Plan Note (Signed)
I will obtain sputum for a repeat AFB smear and culture then arrange follow-up in 2 weeks.

## 2021-03-09 NOTE — Assessment & Plan Note (Signed)
Amazingly enough, he seems to have recovered uneventfully from COVID infection.

## 2021-03-17 ENCOUNTER — Telehealth: Payer: Self-pay

## 2021-03-17 NOTE — Telephone Encounter (Signed)
Received call from [facility] to relay mycobacterium culture results from 03/09/21. Per epic, results were reviewed by provider on 03/17/21 at 1330.   Beryle Flock, RN

## 2021-03-23 ENCOUNTER — Ambulatory Visit (INDEPENDENT_AMBULATORY_CARE_PROVIDER_SITE_OTHER): Payer: Medicaid Other | Admitting: Internal Medicine

## 2021-03-23 ENCOUNTER — Other Ambulatory Visit: Payer: Self-pay

## 2021-03-23 ENCOUNTER — Encounter: Payer: Self-pay | Admitting: Internal Medicine

## 2021-03-23 DIAGNOSIS — A310 Pulmonary mycobacterial infection: Secondary | ICD-10-CM

## 2021-03-23 MED ORDER — ETHAMBUTOL HCL 400 MG PO TABS: 1200.0000 mg | ORAL_TABLET | Freq: Every day | ORAL | 11 refills | Status: DC

## 2021-03-23 MED ORDER — RIFAMPIN 300 MG PO CAPS: 600.0000 mg | ORAL_CAPSULE | Freq: Every day | ORAL | 11 refills | Status: DC

## 2021-03-23 MED ORDER — AZITHROMYCIN 500 MG PO TABS: 500.0000 mg | ORAL_TABLET | Freq: Every day | ORAL | 11 refills | Status: DC

## 2021-03-23 NOTE — Progress Notes (Signed)
Virtual Visit via Telephone Note  I connected with Derek [name] on 03/23/21 at 10:00 AM EDT by telephone and verified that I am speaking with the correct person using two identifiers.  Location: Patient: Home  Provider: [facility]   I discussed the limitations, risks, security and privacy concerns of performing an evaluation and management service by telephone and the availability of in person appointments. I also discussed with the patient that there may be a patient responsible charge related to this service. The patient expressed understanding and agreed to proceed.   History of Present Illness: I called and spoke with Derek [name] today.  He says that he is feeling a little bit better than he was when I saw him last month but he is still coughing up quite a bit of phlegm and bothered by his chronic dyspnea on exertion.   Observations/Objective: 03/09/2021 AFB smear 1+ positive and cultures growing AFB  01/11/2021 chest x-ray IMPRESSION:  No evidence of acute cardiopulmonary disease.   New nodule in the right hilum. Referral for pulmonary/oncology  follow-up recommended as well as further evaluation with repeat  chest CT.   Similar appearance of cavitary lesion in the left upper lobe,  unchanged.  Assessment and Plan: He has multifactorial lung disease.  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  I have him restart azithromycin, ethambutol and rifampin.  I instructed him to take it every day even if he is drinking alcohol.  He is interested in restarting naltrexone which he says helped him cut down and stop drinking alcohol for a period of time several years ago.  I asked him to discuss this with his PCP  Follow Up Instructions: Restart azithromycin, ethambutol and rifampin Follow-up here in 1 month   I discussed the assessment and treatment plan with the patient. The patient was provided an opportunity to ask questions and all were answered. The patient agreed with the  plan and demonstrated an understanding of the instructions.   The patient was advised to call back or seek an in-person evaluation if the symptoms worsen or if the condition fails to improve as anticipated.  I provided 17 minutes of non-face-to-face time during this encounter.   [name], MD

## 2021-03-24 ENCOUNTER — Telehealth: Payer: Self-pay

## 2021-03-24 NOTE — Telephone Encounter (Signed)
Received call from [facility] to notify provider of mycobacteria culture results. Provider aware.   Beryle Flock, RN

## 2021-04-21 ENCOUNTER — Encounter: Payer: Self-pay | Admitting: Internal Medicine

## 2021-04-21 ENCOUNTER — Other Ambulatory Visit: Payer: Self-pay

## 2021-04-21 ENCOUNTER — Ambulatory Visit (INDEPENDENT_AMBULATORY_CARE_PROVIDER_SITE_OTHER): Payer: Medicaid Other | Admitting: Internal Medicine

## 2021-04-21 DIAGNOSIS — A310 Pulmonary mycobacterial infection: Secondary | ICD-10-CM

## 2021-04-21 DIAGNOSIS — F1020 Alcohol dependence, uncomplicated: Secondary | ICD-10-CM | POA: Diagnosis not present

## 2021-04-21 DIAGNOSIS — F17218 Nicotine dependence, cigarettes, with other nicotine-induced disorders: Secondary | ICD-10-CM | POA: Diagnosis not present

## 2021-04-21 NOTE — Progress Notes (Signed)
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    Priority: High   Neck mass 09/23/2019   Gastritis and gastroduodenitis 01/10/2016   Esophageal reflux 12/05/2015   Colon cancer screening 10/07/2015   Chronic hepatitis C without hepatic coma ([place]) 09/06/2015   Alcoholism ([place]) 08/03/2015   Chronic obstructive pulmonary disease ([place]) 08/03/2015   Cigarette nicotine dependence with other nicotine-induced disorder 08/03/2015    Patient's Medications  New Prescriptions   No medications on file  Previous Medications   ALBUTEROL (PROVENTIL HFA;VENTOLIN HFA) 108 (90 BASE) MCG/ACT INHALER    Inhale 2 puffs into the lungs every 6 (six) hours as needed for wheezing or shortness of breath.   ALBUTEROL (PROVENTIL) (2.5 MG/3ML) 0.083% NEBULIZER SOLUTION    Take 3 mLs (2.5 mg total) by nebulization every 6 (six) hours as needed for wheezing or shortness of breath.   AZITHROMYCIN (ZITHROMAX) 500 MG TABLET    Take 1 tablet (500 mg total) by mouth daily.   CYANOCOBALAMIN (VITAMIN B-12 PO)    Take 1 tablet by mouth daily.   ETHAMBUTOL (MYAMBUTOL) 400 MG TABLET    Take 3 tablets (1,200 mg total) by mouth daily.   IPRATROPIUM-ALBUTEROL (DUONEB) 0.5-2.5 (3) MG/3ML SOLN    Inhale 3 mLs into the lungs 4 (four) times daily.   LISINOPRIL (PRINIVIL,ZESTRIL) 10 MG TABLET    Take 1 tablet (10 mg total) by mouth daily.   RIFAMPIN (RIFADIN) 300 MG CAPSULE    Take 2 capsules (600 mg total) by mouth daily.   SYMBICORT 160-4.5 MCG/ACT INHALER    Inhale 1 puff into the lungs 2 (two) times daily.  Modified Medications   No medications on file  Discontinued Medications   No medications on file    Subjective: Derek [name] he is in for his routine follow-up visit.  He restarted azithromycin, ethambutol and rifampin on 03/23/2021 for relapsed/persistent Mycobacterium avium  pneumonia.  He has been tolerating his antibiotics well and has not missed doses.  He states that he is feeling much better.  He says that he feels like his chest has "dried up".  He is coughing much less, especially at night and bringing up less sputum.  His sputum is not as thick.  He is also noted improvement in his shortness of breath and stamina.  He is still smoking about half pack of cigarettes daily but says he is going to start using nicotine patches.  He says he drinks about 4-5 beers daily.  He says that he used to take a naltrexone and that it helped him quit drinking for 1 year.  He has not been back to his PCP to discuss going back on it.  Review of Systems: Review of Systems  Constitutional:  Negative for fever and weight loss.  Respiratory:  Positive for cough, sputum production and shortness of breath. Negative for hemoptysis and wheezing.   Cardiovascular:  Negative for chest pain.  Gastrointestinal:  Negative for abdominal pain, diarrhea, nausea and vomiting.   Past Medical History:  Diagnosis Date   Arthritis    Asthma    COPD (chronic obstructive pulmonary disease) ([place])    Emphysema lung (HCC)    Glaucoma    Hepatitis    C   Hypertension    Lung nodules    Bilateral   Substance abuse ([place])  Tuberculosis     Social History   Tobacco Use   Smoking status: Every Day    Packs/day: 1.00    Years: 40.00    Pack years: 40.00    Types: Cigarettes   Smokeless tobacco: Never  Vaping Use   Vaping Use: Former  Substance Use Topics   Alcohol use: Yes    Comment: Drinks beer 2-3 times a week   Drug use: Yes    Types: Marijuana, Cocaine    Comment: Remote cocaine use, Current marjiuana use    Family History  Problem Relation Age of Onset   Cancer Mother        lymph nodes   Ulcers Father    Cholecystitis Father    Colon cancer Neg Hx    Colon polyps Neg Hx     No Known Allergies  Objective: Vitals:   04/21/21 1038  BP: (!) 170/92  Pulse: 95  Temp:  98.3 F (36.8 C)  TempSrc: Oral  SpO2: 96%  Weight: 180 lb (81.6 kg)   Body mass index is 24.41 kg/m.  Physical Exam Constitutional:      Comments: He seems much more comfortable and in better spirits today.  Cardiovascular:     Rate and Rhythm: Normal rate and regular rhythm.     Heart sounds: No murmur heard. Pulmonary:     Effort: Pulmonary effort is normal.     Breath sounds: Rales present. No wheezing.     Comments: He has a few crackles in his right lung base posteriorly. Abdominal:     Palpations: Abdomen is soft.     Tenderness: There is no abdominal tenderness.  Psychiatric:        Mood and Affect: Mood normal.    Lab Results    Problem List Items Addressed This Visit       High   Mycobacterium avium infection ([place])    He has had prompt clinical improvement since restarting therapy for Mycobacterium avium.  He will continue his 3 drug regimen and give a sputum sample today for repeat culture.  He will follow-up here in 2 months.      Relevant Orders   MYCOBACTERIA, CULTURE, WITH FLUOROCHROME SMEAR     Unprioritized   Alcoholism ([place])    He has fairly good insight regarding the problems with his alcohol use.  He is motivated to talk with his PCP about restarting naltrexone.      Cigarette nicotine dependence with other nicotine-induced disorder    I encouraged him to continue his efforts to cut down and quit smoking cigarettes.        [name], MD [facility] [phone] pager   [phone] cell 04/21/2021, 10:55 AM

## 2021-04-21 NOTE — Assessment & Plan Note (Signed)
He has had prompt clinical improvement since restarting therapy for Mycobacterium avium.  He will continue his 3 drug regimen and give a sputum sample today for repeat culture.  He will follow-up here in 2 months.

## 2021-04-21 NOTE — Assessment & Plan Note (Signed)
I encouraged him to continue his efforts to cut down and quit smoking cigarettes.

## 2021-04-21 NOTE — Assessment & Plan Note (Signed)
He has fairly good insight regarding the problems with his alcohol use.  He is motivated to talk with his PCP about restarting naltrexone.

## 2021-05-09 LAB — MYCOBACTERIA,CULT W/FLUOROCHROME SMEAR
MICRO NUMBER:: 12118462
SPECIMEN QUALITY:: ADEQUATE

## 2021-05-09 LAB — M. AVIUM MIC PANEL
AMIKACIN: 32
CIPROFLOXACIN: >8
CLARITHROMYCIN: 4
LINEZOLID: 32
MOXIFLOXACIN: >4
RIFABUTIN: 1
RIFAMPIN: >4
STREPTOMYCIN: >32

## 2021-06-04 LAB — MYCOBACTERIA,CULT W/FLUOROCHROME SMEAR
MICRO NUMBER:: 12294558
SPECIMEN QUALITY:: ADEQUATE

## 2021-06-22 ENCOUNTER — Ambulatory Visit: Payer: Medicaid Other | Admitting: Internal Medicine

## 2021-06-29 ENCOUNTER — Ambulatory Visit: Payer: Medicaid Other | Admitting: Internal Medicine

## 2021-06-29 ENCOUNTER — Telehealth: Payer: Self-pay

## 2021-06-29 NOTE — Telephone Encounter (Signed)
Called patient to reschedule today's missed appointment, no answer. Left HIPAA compliant voicemail requesting callback.   Seng Fouts D Alessandra Sawdey, RN  

## 2021-08-03 ENCOUNTER — Other Ambulatory Visit: Payer: Self-pay

## 2021-08-03 ENCOUNTER — Ambulatory Visit (INDEPENDENT_AMBULATORY_CARE_PROVIDER_SITE_OTHER): Payer: Medicaid Other

## 2021-08-03 ENCOUNTER — Ambulatory Visit (INDEPENDENT_AMBULATORY_CARE_PROVIDER_SITE_OTHER): Payer: Medicaid Other | Admitting: Internal Medicine

## 2021-08-03 ENCOUNTER — Encounter: Payer: Self-pay | Admitting: Internal Medicine

## 2021-08-03 VITALS — BP 164/95 | HR 84 | Temp 97.7°F | Wt 177.0 lb

## 2021-08-03 DIAGNOSIS — Z23 Encounter for immunization: Secondary | ICD-10-CM

## 2021-08-03 DIAGNOSIS — A310 Pulmonary mycobacterial infection: Secondary | ICD-10-CM

## 2021-08-03 MED ORDER — AZITHROMYCIN 500 MG PO TABS: 500.0000 mg | ORAL_TABLET | Freq: Every day | ORAL | 11 refills | Status: DC

## 2021-08-03 MED ORDER — ETHAMBUTOL HCL 400 MG PO TABS: 1200.0000 mg | ORAL_TABLET | Freq: Every day | ORAL | 11 refills | Status: DC

## 2021-08-03 MED ORDER — RIFAMPIN 300 MG PO CAPS: 600.0000 mg | ORAL_CAPSULE | Freq: Every day | ORAL | 11 refills | Status: DC

## 2021-08-03 NOTE — Progress Notes (Signed)
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    Priority: High   Neck mass 09/23/2019   Gastritis and gastroduodenitis 01/10/2016   Esophageal reflux 12/05/2015   Colon cancer screening 10/07/2015   Chronic hepatitis C without hepatic coma ([place]) 09/06/2015   Alcoholism ([place]) 08/03/2015   Chronic obstructive pulmonary disease ([place]) 08/03/2015   Cigarette nicotine dependence with other nicotine-induced disorder 08/03/2015    Patient's Medications  New Prescriptions   No medications on file  Previous Medications   ALBUTEROL (PROVENTIL HFA;VENTOLIN HFA) 108 (90 BASE) MCG/ACT INHALER    Inhale 2 puffs into the lungs every 6 (six) hours as needed for wheezing or shortness of breath.   ALBUTEROL (PROVENTIL) (2.5 MG/3ML) 0.083% NEBULIZER SOLUTION    Take 3 mLs (2.5 mg total) by nebulization every 6 (six) hours as needed for wheezing or shortness of breath.   CYANOCOBALAMIN (VITAMIN B-12 PO)    Take 1 tablet by mouth daily.   IPRATROPIUM-ALBUTEROL (DUONEB) 0.5-2.5 (3) MG/3ML SOLN    Inhale 3 mLs into the lungs 4 (four) times daily.   LISINOPRIL (PRINIVIL,ZESTRIL) 10 MG TABLET    Take 1 tablet (10 mg total) by mouth daily.   SYMBICORT 160-4.5 MCG/ACT INHALER    Inhale 1 puff into the lungs 2 (two) times daily.  Modified Medications   Modified Medication Previous Medication   AZITHROMYCIN (ZITHROMAX) 500 MG TABLET azithromycin (ZITHROMAX) 500 MG tablet      Take 1 tablet (500 mg total) by mouth daily.    Take 1 tablet (500 mg total) by mouth daily.   ETHAMBUTOL (MYAMBUTOL) 400 MG TABLET ethambutol (MYAMBUTOL) 400 MG tablet      Take 3 tablets (1,200 mg total) by mouth daily.    Take 3 tablets (1,200 mg total) by mouth daily.   RIFAMPIN (RIFADIN) 300 MG CAPSULE rifampin (RIFADIN) 300 MG capsule      Take 2 capsules (600 mg total) by mouth  daily.    Take 2 capsules (600 mg total) by mouth daily.  Discontinued Medications   No medications on file    Subjective: Kayshaun is in for his routine follow-up visit.  He has a history of persistent Mycobacterium avium pneumonia complicating his COPD, continued cigarette use and alcoholism.  He restarted azithromycin, ethambutol and rifampin in late July after he had COVID-pneumonia in June.  He had a good clinical response with marked improvement in his dyspnea on exertion.  Unfortunately he ran out of his antibiotics 8 days ago even though he has refills at his pharmacy.  He has not noted any significant worsening since he ran out.  His PCP started him back on naltrexone and he says he is drinking less alcohol.  He has started using nicotine lozenges and has been able to cut down some on his cigarettes.  He says he is smoking a little less than 1 pack/day.  His cough remains productive.  He had 1 bout of very mild hemoptysis several weeks ago.  Review of Systems: Review of Systems  Constitutional:  Negative for chills, diaphoresis, fever and weight loss.  Respiratory:  Positive for cough, hemoptysis, sputum production, shortness of breath and wheezing.   Cardiovascular:  Negative for chest pain.  Gastrointestinal:  Negative for abdominal pain, diarrhea, nausea and vomiting.   Past Medical History:  Diagnosis  Date   Arthritis    Asthma    COPD (chronic obstructive pulmonary disease) (HCC)    Emphysema lung (HCC)    Glaucoma    Hepatitis    C   Hypertension    Lung nodules    Bilateral   Substance abuse ([place])    Tuberculosis     Social History   Tobacco Use   Smoking status: Every Day    Packs/day: 1.00    Years: 40.00    Pack years: 40.00    Types: Cigarettes   Smokeless tobacco: Never  Vaping Use   Vaping Use: Former  Substance Use Topics   Alcohol use: Yes    Comment: Drinks beer 2-3 times a week   Drug use: Yes    Types: Marijuana, Cocaine    Comment: Remote cocaine  use, Current marjiuana use    Family History  Problem Relation Age of Onset   Cancer Mother        lymph nodes   Ulcers Father    Cholecystitis Father    Colon cancer Neg Hx    Colon polyps Neg Hx     No Known Allergies  Objective: Vitals:   08/03/21 0846  BP: (!) 164/95  Pulse: 84  Temp: 97.7 F (36.5 C)  TempSrc: Oral  Weight: 177 lb (80.3 kg)   Body mass index is 24.01 kg/m.  Physical Exam Constitutional:      Comments: His spirits are good.  Cardiovascular:     Rate and Rhythm: Normal rate and regular rhythm.     Heart sounds: No murmur heard. Pulmonary:     Effort: Pulmonary effort is normal.     Breath sounds: Rhonchi present. No wheezing or rales.     Comments: He has very distant breath sounds throughout all lung fields. Psychiatric:        Mood and Affect: Mood normal.    Lab Results    Problem List Items Addressed This Visit       High   Mycobacterium avium infection ([place])    His sputum culture 1 month after recently restarting antibiotics was still positive for Mycobacterium avium.  However he has had a good clinical response to antibiotics before he ran out recently.  I will restart his antibiotics now.  He received his annual influenza and COVID booster vaccines here today.  He will follow-up in 2 months.      Relevant Medications   azithromycin (ZITHROMAX) 500 MG tablet   ethambutol (MYAMBUTOL) 400 MG tablet   rifampin (RIFADIN) 300 MG capsule     [name], MD [facility] [place] [phone] pager   [phone] cell 08/03/2021, 9:02 AM

## 2021-08-03 NOTE — Assessment & Plan Note (Signed)
His sputum culture 1 month after recently restarting antibiotics was still positive for Mycobacterium avium.  However he has had a good clinical response to antibiotics before he ran out recently.  I will restart his antibiotics now.  He received his annual influenza and COVID booster vaccines here today.  He will follow-up in 2 months.

## 2021-10-05 ENCOUNTER — Encounter: Payer: Self-pay | Admitting: Internal Medicine

## 2021-10-05 ENCOUNTER — Other Ambulatory Visit: Payer: Self-pay

## 2021-10-05 ENCOUNTER — Ambulatory Visit (INDEPENDENT_AMBULATORY_CARE_PROVIDER_SITE_OTHER): Payer: Medicaid Other | Admitting: Internal Medicine

## 2021-10-05 DIAGNOSIS — F17218 Nicotine dependence, cigarettes, with other nicotine-induced disorders: Secondary | ICD-10-CM | POA: Diagnosis not present

## 2021-10-05 DIAGNOSIS — A310 Pulmonary mycobacterial infection: Secondary | ICD-10-CM | POA: Diagnosis not present

## 2021-10-05 DIAGNOSIS — U071 COVID-19: Secondary | ICD-10-CM | POA: Diagnosis not present

## 2021-10-05 NOTE — Assessment & Plan Note (Signed)
He has improved clinically.  He will continue his current 3 antibiotic regimen and I will get another sputum for AFB stain and culture today.  He will follow-up in 3 months.

## 2021-10-05 NOTE — Assessment & Plan Note (Signed)
He does not feel like he can cut down or quit now.

## 2021-10-05 NOTE — Assessment & Plan Note (Signed)
He received a COVID booster vaccine at the time of his last visit.

## 2021-10-05 NOTE — Progress Notes (Signed)
[facility]  Patient Active Problem List   Diagnosis Date Noted   COVID-19 03/09/2021    Priority: High   Mycobacterium avium infection (Derek [name]) 12/12/2017    Priority: High   Neck mass 09/23/2019   Gastritis and gastroduodenitis 01/10/2016   Esophageal reflux 12/05/2015   Colon cancer screening 10/07/2015   Chronic hepatitis C without hepatic coma (Derek [name]) 09/06/2015   Alcoholism (Derek [name]) 08/03/2015   Chronic obstructive pulmonary disease (Derek [name]) 08/03/2015   Cigarette nicotine dependence with other nicotine-induced disorder 08/03/2015    Patient's Medications  New Prescriptions   No medications on file  Previous Medications   ALBUTEROL (PROVENTIL HFA;VENTOLIN HFA) 108 (90 BASE) MCG/ACT INHALER    Inhale 2 puffs into the lungs every 6 (six) hours as needed for wheezing or shortness of breath.   ALBUTEROL (PROVENTIL) (2.5 MG/3ML) 0.083% NEBULIZER SOLUTION    Take 3 mLs (2.5 mg total) by nebulization every 6 (six) hours as needed for wheezing or shortness of breath.   AZITHROMYCIN (ZITHROMAX) 500 MG TABLET    Take 1 tablet (500 mg total) by mouth daily.   CYANOCOBALAMIN (VITAMIN B-12 PO)    Take 1 tablet by mouth daily.   ETHAMBUTOL (MYAMBUTOL) 400 MG TABLET    Take 3 tablets (1,200 mg total) by mouth daily.   IPRATROPIUM-ALBUTEROL (DUONEB) 0.5-2.5 (3) MG/3ML SOLN    Inhale 3 mLs into the lungs 4 (four) times daily.   LISINOPRIL (PRINIVIL,ZESTRIL) 10 MG TABLET    Take 1 tablet (10 mg total) by mouth daily.   RIFAMPIN (RIFADIN) 300 MG CAPSULE    Take 2 capsules (600 mg total) by mouth daily.   SYMBICORT 160-4.5 MCG/ACT INHALER    Inhale 1 puff into the lungs 2 (two) times daily.  Modified Medications   No medications on file  Discontinued Medications   No medications on file    Subjective: Derek [name] in for his routine follow-up visit.  He has restarted azithromycin, ethambutol and rifampin last July for his persistent Mycobacterium avium pneumonia after  he was diagnosed with COVID.  He has noted some clinical improvement with less cough and shortness of breath.  However sputum cultures last October remain positive.  He [name] still smoking a pack of cigarettes daily and has some current plans to quit.  He stopped taking naltrexone because he felt like it was making him anxious.  He says he [name] drinking 2-6 beers each day  Review of Systems: Review of Systems  Constitutional:  Negative for fever.  Respiratory:  Positive for cough, sputum production and shortness of breath. Negative for hemoptysis.   Gastrointestinal:        Some "stomach upset" with loose stools 2-3 times per week.  Psychiatric/Behavioral:  Negative for depression. The patient [name] nervous/anxious.    Past Medical History:  Diagnosis Date   Arthritis    Asthma    COPD (chronic obstructive pulmonary disease) (Derek [name])    Emphysema lung (HCC)    Glaucoma    Hepatitis    C   Hypertension    Lung nodules    Bilateral   Substance abuse ([place])    Tuberculosis     Social History   Tobacco Use   Smoking status: Every Day    Packs/day: 1.00    Years: 40.00    Pack years: 40.00    Types: Cigarettes   Smokeless tobacco: Never  Vaping Use   Vaping Use: Former  Substance Use  Topics   Alcohol use: Yes    Comment: Drinks beer 2-3 times a week   Drug use: Yes    Types: Marijuana, Cocaine    Comment: Remote cocaine use, Current marjiuana use    Family History  Problem Relation Age of Onset   Cancer Mother        lymph nodes   Ulcers Father    Cholecystitis Father    Colon cancer Neg Hx    Colon polyps Neg Hx     No Known Allergies  Objective: Vitals:   10/05/21 0849  BP: (!) 161/93  Pulse: 86  Temp: 98.2 F (36.8 C)  TempSrc: Oral  SpO2: 95%  Weight: 178 lb (80.7 kg)  Height: 5\' 10"  (1.778 m)   Body mass index [name] 25.54 kg/m.  Physical Exam Constitutional:      Comments: He [name] talkative and calm.  Cardiovascular:     Rate and Rhythm: Normal rate and  regular rhythm.     Heart sounds: No murmur heard. Pulmonary:     Effort: Pulmonary effort [name] normal.     Breath sounds: Normal breath sounds. No wheezing, rhonchi or rales.  Psychiatric:        Mood and Affect: Mood normal.    Lab Results    Problem List Items Addressed This Visit       High   Mycobacterium avium infection (Derek [name])    He has improved clinically.  He will continue his current 3 antibiotic regimen and I will get another sputum for AFB stain and culture today.  He will follow-up in 3 months.      Relevant Orders   MYCOBACTERIA, CULTURE, WITH FLUOROCHROME SMEAR   COVID-19    He received a COVID booster vaccine at the time of his last visit.        Unprioritized   Cigarette nicotine dependence with other nicotine-induced disorder    He does not feel like he can cut down or quit now.        [name], MD [name] [phone] pager   [phone] cell 10/05/2021, 9:04 AM

## 2021-11-09 ENCOUNTER — Telehealth: Payer: Self-pay

## 2021-11-09 NOTE — Telephone Encounter (Signed)
Patient called requesting refills of his MAC medications. Spoke with [facility], they have plenty of refills on file for the patient and advised that he has not filled his azithromycin since 09/03/21, his rifampin and ethambutol since 08/03/22.  ? ?They recommend a "Med Sync" program to assist with consistent monthly refills for the patient. Called the patient back and advised him to call the pharmacy to request refills and discuss program. Patient verbalized understanding and has no further questions.  ? ?Beryle Flock, RN ? ?

## 2021-11-19 LAB — MYCOBACTERIA,CULT W/FLUOROCHROME SMEAR
MICRO NUMBER:: 12984413
SMEAR:: NONE SEEN
SPECIMEN QUALITY:: ADEQUATE

## 2022-01-04 ENCOUNTER — Other Ambulatory Visit: Payer: Self-pay

## 2022-01-04 ENCOUNTER — Ambulatory Visit (INDEPENDENT_AMBULATORY_CARE_PROVIDER_SITE_OTHER): Payer: Medicaid Other | Admitting: Internal Medicine

## 2022-01-04 DIAGNOSIS — U071 COVID-19: Secondary | ICD-10-CM

## 2022-01-04 DIAGNOSIS — A310 Pulmonary mycobacterial infection: Secondary | ICD-10-CM

## 2022-01-04 DIAGNOSIS — F17218 Nicotine dependence, cigarettes, with other nicotine-induced disorders: Secondary | ICD-10-CM | POA: Diagnosis not present

## 2022-01-04 NOTE — Assessment & Plan Note (Signed)
He has shown clinical and microbiological improvement since restarting antibiotic therapy for Mycobacterium avium last year.  He will continue his current 3 drug regimen and follow-up in 4 months. ?

## 2022-01-04 NOTE — Assessment & Plan Note (Signed)
He had a COVID booster vaccine last December, 5 months after developing breakthrough COVID infection. ?

## 2022-01-04 NOTE — Assessment & Plan Note (Signed)
I talked him again about the importance of cutting down and eventually quitting cigarettes.  He seems to have good insight about the dangers of continued smoking but little motivation to try to quit. ?

## 2022-01-04 NOTE — Progress Notes (Signed)
?  ? ? ? ? ?[facility] for Infectious Disease ? ?Patient Active Problem List  ? Diagnosis Date Noted  ? COVID-19 03/09/2021  ?  Priority: High  ? 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  Priority: High  ? Neck mass 09/23/2019  ? Gastritis and gastroduodenitis 01/10/2016  ? Esophageal reflux 12/05/2015  ? Colon cancer screening 10/07/2015  ? Chronic hepatitis C without hepatic coma ([place]) 09/06/2015  ? Alcoholism ([place]) 08/03/2015  ? Chronic obstructive pulmonary disease ([place]) 08/03/2015  ? Cigarette nicotine dependence with other nicotine-induced disorder 08/03/2015  ? ? ?Patient's Medications  ?New Prescriptions  ? No medications on file  ?Previous Medications  ? ALBUTEROL (PROVENTIL HFA;VENTOLIN HFA) 108 (90 BASE) MCG/ACT INHALER    Inhale 2 puffs into the lungs every 6 (six) hours as needed for wheezing or shortness of breath.  ? ALBUTEROL (PROVENTIL) (2.5 MG/3ML) 0.083% NEBULIZER SOLUTION    Take 3 mLs (2.5 mg total) by nebulization every 6 (six) hours as needed for wheezing or shortness of breath.  ? AZITHROMYCIN (ZITHROMAX) 500 MG TABLET    Take 1 tablet (500 mg total) by mouth daily.  ? CYANOCOBALAMIN (VITAMIN B-12 PO)    Take 1 tablet by mouth daily.  ? ETHAMBUTOL (MYAMBUTOL) 400 MG TABLET    Take 3 tablets (1,200 mg total) by mouth daily.  ? IPRATROPIUM-ALBUTEROL (DUONEB) 0.5-2.5 (3) MG/3ML SOLN    Inhale 3 mLs into the lungs 4 (four) times daily.  ? LISINOPRIL (PRINIVIL,ZESTRIL) 10 MG TABLET    Take 1 tablet (10 mg total) by mouth daily.  ? RIFAMPIN (RIFADIN) 300 MG CAPSULE    Take 2 capsules (600 mg total) by mouth daily.  ? SYMBICORT 160-4.5 MCG/ACT INHALER    Inhale 1 puff into the lungs 2 (two) times daily.  ?Modified Medications  ? No medications on file  ?Discontinued Medications  ? No medications on file  ? ? ?Subjective: ?Derek [name] is in for his routine follow-up visit.  He has restarted azithromycin, ethambutol and rifampin last July for his persistent Mycobacterium avium pneumonia after  he was diagnosed with COVID.  He has a little bit of nausea and loose stools that he attributes to his antibiotics but feels like he can certainly continue taking them.  He has noted some clinical improvement with less cough and shortness of breath.  However sputum cultures last October remained positive but a repeat AFB stain and culture in February were both negative.  He is still smoking a pack of cigarettes daily and has no current plans to quit.  He says he is drinking 4-6 beers each day ? ?Review of Systems: ?Review of Systems  ?Constitutional:  Positive for weight loss. Negative for fever.  ?Respiratory:  Positive for cough, sputum production, shortness of breath and wheezing. Negative for hemoptysis.   ?Cardiovascular:  Negative for chest pain.  ?Gastrointestinal:  Positive for nausea. Negative for abdominal pain, diarrhea and vomiting.  ?     Some "stomach upset" with loose stools 2-3 times per week.  ?Psychiatric/Behavioral:  Negative for depression. The patient is nervous/anxious.   ? ?Past Medical History:  ?Diagnosis Date  ? Arthritis   ? Asthma   ? COPD (chronic obstructive pulmonary disease) ([place])   ? Emphysema lung ([place])   ? Glaucoma   ? Hepatitis   ? C  ? Hypertension   ? Lung nodules   ? Bilateral  ? Substance abuse ([place])   ? Tuberculosis   ? ? ?Social History  ? ?  Tobacco Use  ? Smoking status: Every Day  ?  Packs/day: 1.00  ?  Years: 40.00  ?  Pack years: 40.00  ?  Types: Cigarettes  ? Smokeless tobacco: Never  ? Tobacco comments:  ?  States thinking about quitting, but "old habits are hard to break"  ?Vaping Use  ? Vaping Use: Former  ?Substance Use Topics  ? Alcohol use: Yes  ?  Comment: daily  ? Drug use: Yes  ?  Types: Marijuana, Cocaine  ?  Comment: Remote cocaine use, Current marjiuana use socially  ? ? ?Family History  ?Problem Relation Age of Onset  ? Cancer Mother   ?     lymph nodes  ? Ulcers Father   ? Cholecystitis Father   ? Colon cancer Neg Hx   ? Colon polyps Neg Hx   ? ? ?No Known  Allergies ? ?Objective: ?Vitals:  ? 01/04/22 0844  ?BP: (!) 167/85  ?Pulse: 88  ?Resp: 16  ?Temp: 98.2 ?F (36.8 ?C)  ?SpO2: 98%  ?Weight: 175 lb (79.4 kg)  ?Height: '5\' 10"'$  (1.778 m)  ? ?Body mass index is 25.11 kg/m?. ? ?Physical Exam ?Constitutional:   ?   Comments: He is talkative and calm.  ?Cardiovascular:  ?   Rate and Rhythm: Normal rate and regular rhythm.  ?   Heart sounds: No murmur heard. ?Pulmonary:  ?   Effort: Pulmonary effort is normal.  ?   Breath sounds: Wheezing present. No rhonchi or rales.  ?Psychiatric:     ?   Mood and Affect: Mood normal.  ? ? ?Lab Results ? ?  ?Problem List Items Addressed This Visit   ? ?  ? High  ? Mycobacterium avium infection ([place])  ?  He has shown clinical and microbiological improvement since restarting antibiotic therapy for Mycobacterium avium last year.  He will continue his current 3 drug regimen and follow-up in 4 months. ? ?  ?  ? COVID-19  ?  He had a COVID booster vaccine last December, 5 months after developing breakthrough COVID infection. ? ?  ?  ?  ? Unprioritized  ? Cigarette nicotine dependence with other nicotine-induced disorder  ?  I talked him again about the importance of cutting down and eventually quitting cigarettes.  He seems to have good insight about the dangers of continued smoking but little motivation to try to quit. ? ?  ?  ? ? ?[name], MD ?[facility] for [facility] ?[place] ?[phone] pager   [phone] cell ?01/04/2022, 8:54 AM ?

## 2022-05-03 ENCOUNTER — Ambulatory Visit (INDEPENDENT_AMBULATORY_CARE_PROVIDER_SITE_OTHER): Payer: Medicaid Other | Admitting: Internal Medicine

## 2022-05-03 ENCOUNTER — Encounter: Payer: Self-pay | Admitting: Internal Medicine

## 2022-05-03 ENCOUNTER — Other Ambulatory Visit: Payer: Self-pay

## 2022-05-03 DIAGNOSIS — A310 Pulmonary mycobacterial infection: Secondary | ICD-10-CM | POA: Diagnosis not present

## 2022-05-03 MED ORDER — AZITHROMYCIN 500 MG PO TABS: 500.0000 mg | ORAL_TABLET | Freq: Every day | ORAL | 11 refills | Status: DC

## 2022-05-03 MED ORDER — ETHAMBUTOL HCL 400 MG PO TABS: 1200.0000 mg | ORAL_TABLET | Freq: Every day | ORAL | 11 refills | Status: DC

## 2022-05-03 MED ORDER — RIFAMPIN 300 MG PO CAPS: 600.0000 mg | ORAL_CAPSULE | Freq: Every day | ORAL | 11 refills | Status: DC

## 2022-05-03 NOTE — Progress Notes (Signed)
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) 08/03/2015   Cigarette nicotine dependence with other nicotine-induced disorder 08/03/2015    Patient's Medications  New Prescriptions   No medications on file  Previous Medications   ALBUTEROL (PROVENTIL HFA;VENTOLIN HFA) 108 (90 BASE) MCG/ACT INHALER    Inhale 2 puffs into the lungs every 6 (six) hours as needed for wheezing or shortness of breath.   ALBUTEROL (PROVENTIL) (2.5 MG/3ML) 0.083% NEBULIZER SOLUTION    Take 3 mLs (2.5 mg total) by nebulization every 6 (six) hours as needed for wheezing or shortness of breath.   CYANOCOBALAMIN (VITAMIN B-12 PO)    Take 1 tablet by mouth daily.   IPRATROPIUM-ALBUTEROL (DUONEB) 0.5-2.5 (3) MG/3ML SOLN    Inhale 3 mLs into the lungs 4 (four) times daily.   LISINOPRIL (PRINIVIL,ZESTRIL) 10 MG TABLET    Take 1 tablet (10 mg total) by mouth daily.   METOPROLOL SUCCINATE (TOPROL-XL) 50 MG 24 HR TABLET    Take 50 mg by mouth daily.   SYMBICORT 160-4.5 MCG/ACT INHALER    Inhale 1 puff into the lungs 2 (two) times daily.  Modified Medications   Modified Medication Previous Medication   AZITHROMYCIN (ZITHROMAX) 500 MG TABLET azithromycin (ZITHROMAX) 500 MG tablet      Take 1 tablet (500 mg total) by mouth daily.    Take 1 tablet (500 mg total) by mouth daily.   ETHAMBUTOL (MYAMBUTOL) 400 MG TABLET ethambutol (MYAMBUTOL) 400 MG tablet      Take 3 tablets (1,200 mg total) by mouth daily.    Take 3 tablets (1,200 mg total) by mouth daily.   RIFAMPIN (RIFADIN) 300 MG CAPSULE  rifampin (RIFADIN) 300 MG capsule      Take 2 capsules (600 mg total) by mouth daily.    Take 2 capsules (600 mg total) by mouth daily.  Discontinued Medications   No medications on file    Subjective: He restarted azithromycin, ethambutol and rifampin in July of last year for his smoldering Mycobacterium avium pneumonia.  He quickly noticed improvement in his cough, sputum production and shortness of breath.  He denies missing doses and has tolerated his antibiotics well.  Unfortunately he has made no progress on his cigarette use and alcohol consumption.  Review of Systems: Review of Systems  Constitutional:  Negative for chills, diaphoresis, fever and weight loss.  Respiratory:  Positive for cough, sputum production, shortness of breath and wheezing. Negative for hemoptysis.   Cardiovascular:  Negative for chest pain.  Gastrointestinal:  Negative for abdominal pain, diarrhea, nausea and vomiting.    Past Medical History:  Diagnosis Date   Arthritis    Asthma    COPD (chronic obstructive pulmonary disease) ([place])    Emphysema lung ([facility])    Glaucoma    Hepatitis    C   Hypertension    Lung nodules    Bilateral   Substance abuse ([place])    Tuberculosis     Social History   Tobacco Use   Smoking status: Every Day  Packs/day: 1.00    Years: 40.00    Total pack years: 40.00    Types: Cigarettes   Smokeless tobacco: Never   Tobacco comments:    States thinking about quitting, but "old habits are hard to break"  Vaping Use   Vaping Use: Former  Substance Use Topics   Alcohol use: Yes    Comment: states 3-4 beers/day   Drug use: Yes    Types: Marijuana, Cocaine    Comment: Remote cocaine use, Current marjiuana use daily    Family History  Problem Relation Age of Onset   Cancer Mother        lymph nodes   Ulcers Father    Cholecystitis Father    Colon cancer Neg Hx    Colon polyps Neg Hx     No Known Allergies  Objective: Vitals:   05/03/22 0912  BP: (!)  168/108  Pulse: 98  Temp: 97.8 F (36.6 C)  TempSrc: Oral  SpO2: 92%  Weight: 175 lb (79.4 kg)  Height: '5\' 10"'$  (1.778 m)   Body mass index is 25.11 kg/m.  Physical Exam Constitutional:      Comments: He is in good spirits as usual.  Cardiovascular:     Rate and Rhythm: Normal rate and regular rhythm.     Heart sounds: No murmur heard. Pulmonary:     Effort: Pulmonary effort is normal.     Breath sounds: Wheezing present. No rhonchi or rales.  Psychiatric:        Mood and Affect: Mood normal.     Lab Results 10/05/2021 Sputum AFB smear and culture negative    Problem List Items Addressed This Visit       High   Mycobacterium avium infection ([place])    He has had a good clinical and microbiological response to reinstitution of antibiotic therapy for Mycobacterium avium pneumonia.  I will collect a repeat sputum sample for smear and culture today.  He will continue azithromycin, ethambutol and rifampin and follow-up in 3 months.  I encouraged him to get the new formulation of the COVID-vaccine and an influenza vaccine within the next month.      Relevant Medications   azithromycin (ZITHROMAX) 500 MG tablet   ethambutol (MYAMBUTOL) 400 MG tablet   rifampin (RIFADIN) 300 MG capsule   Other Relevant Orders   MYCOBACTERIA, CULTURE, WITH FLUOROCHROME SMEAR     [name], MD [facility] for Infectious [place] [phone] pager   [phone] cell 05/03/2022, 9:31 AM

## 2022-05-03 NOTE — Assessment & Plan Note (Signed)
He has had a good clinical and microbiological response to reinstitution of antibiotic therapy for Mycobacterium avium pneumonia.  I will collect a repeat sputum sample for smear and culture today.  He will continue azithromycin, ethambutol and rifampin and follow-up in 3 months.  I encouraged him to get the new formulation of the COVID-vaccine and an influenza vaccine within the next month.

## 2022-06-13 LAB — MYCOBACTERIA,CULT W/FLUOROCHROME SMEAR
MICRO NUMBER:: 13882529
SMEAR:: NONE SEEN
SPECIMEN QUALITY:: ADEQUATE

## 2022-08-02 ENCOUNTER — Ambulatory Visit: Payer: Medicaid Other | Admitting: Internal Medicine

## 2022-08-09 ENCOUNTER — Ambulatory Visit (INDEPENDENT_AMBULATORY_CARE_PROVIDER_SITE_OTHER): Payer: Medicaid Other

## 2022-08-09 ENCOUNTER — Other Ambulatory Visit: Payer: Self-pay

## 2022-08-09 ENCOUNTER — Ambulatory Visit (INDEPENDENT_AMBULATORY_CARE_PROVIDER_SITE_OTHER): Payer: Medicaid Other | Admitting: Internal Medicine

## 2022-08-09 VITALS — BP 165/100 | HR 75 | Temp 97.7°F | Ht 70.0 in | Wt 174.0 lb

## 2022-08-09 DIAGNOSIS — A310 Pulmonary mycobacterial infection: Secondary | ICD-10-CM

## 2022-08-09 DIAGNOSIS — Z23 Encounter for immunization: Secondary | ICD-10-CM

## 2022-08-09 DIAGNOSIS — U071 COVID-19: Secondary | ICD-10-CM

## 2022-08-09 DIAGNOSIS — F17218 Nicotine dependence, cigarettes, with other nicotine-induced disorders: Secondary | ICD-10-CM | POA: Diagnosis not present

## 2022-08-09 MED ORDER — ONDANSETRON HCL 4 MG PO TABS: 4.0000 mg | ORAL_TABLET | Freq: Three times a day (TID) | ORAL | 11 refills | Status: DC | PRN

## 2022-08-09 NOTE — Progress Notes (Signed)
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  Previous Medications   ALBUTEROL (PROVENTIL HFA;VENTOLIN HFA) 108 (90 BASE) MCG/ACT INHALER    Inhale 2 puffs into the lungs every 6 (six) hours as needed for wheezing or shortness of breath.   ALBUTEROL (PROVENTIL) (2.5 MG/3ML) 0.083% NEBULIZER SOLUTION    Take 3 mLs (2.5 mg total) by nebulization every 6 (six) hours as needed for wheezing or shortness of breath.   AZITHROMYCIN (ZITHROMAX) 500 MG TABLET    Take 1 tablet (500 mg total) by mouth daily.   CYANOCOBALAMIN (VITAMIN B-12 PO)    Take 1 tablet by mouth daily.   ETHAMBUTOL (MYAMBUTOL) 400 MG TABLET    Take 3 tablets (1,200 mg total) by mouth daily.   IPRATROPIUM-ALBUTEROL (DUONEB) 0.5-2.5 (3) MG/3ML SOLN    Inhale 3 mLs into the lungs 4 (four) times daily.   LISINOPRIL (PRINIVIL,ZESTRIL) 10 MG TABLET    Take 1 tablet (10 mg total) by mouth daily.   METOPROLOL SUCCINATE (TOPROL-XL) 50 MG 24 HR TABLET    Take 50 mg by mouth daily.   RIFAMPIN (RIFADIN) 300 MG CAPSULE    Take 2 capsules (600 mg total) by mouth daily.   SYMBICORT 160-4.5 MCG/ACT INHALER    Inhale 1 puff into the lungs 2 (two) times daily.  Modified Medications   No medications on file  Discontinued Medications   No medications on  file    Subjective: He restarted azithromycin, ethambutol and rifampin in July of last year for his smoldering Mycobacterium avium pneumonia.  He quickly noticed improvement in his cough, sputum production and shortness of breath.  He says that he occasionally has some upset stomach when taking his antibiotics.  He estimates that he may have been missing his antibiotics 1 day each week recently.  He denies having any vomiting but has occasional nausea.  Unfortunately he has made no progress on his cigarette use and alcohol consumption.  Review of Systems: Review of Systems  Constitutional:  Negative for chills, diaphoresis, fever and weight loss.  HENT:  Positive for hearing loss.   Respiratory:  Positive for cough, sputum production and shortness of breath. Negative for hemoptysis and wheezing.   Cardiovascular:  Negative for chest pain.  Gastrointestinal:  Negative for abdominal pain, diarrhea, nausea and vomiting.    Past Medical History:  Diagnosis Date   Arthritis    Asthma    COPD (chronic obstructive pulmonary disease) ([place])    Emphysema lung ([facility])    Glaucoma    Hepatitis    C   Hypertension    Lung nodules    Bilateral   Substance abuse ([place])    Tuberculosis     Social History   Tobacco Use   Smoking status: Every Day  Packs/day: 1.00    Years: 40.00    Total pack years: 40.00    Types: Cigarettes   Smokeless tobacco: Never   Tobacco comments:    States thinking about quitting, but "old habits are hard to break"  Vaping Use   Vaping Use: Former  Substance Use Topics   Alcohol use: Yes    Comment: states 3-4 beers/day   Drug use: Yes    Types: Marijuana, Cocaine    Comment: Remote cocaine use, Current marjiuana use daily    Family History  Problem Relation Age of Onset   Cancer Mother        lymph nodes   Ulcers Father    Cholecystitis Father    Colon cancer Neg Hx    Colon polyps Neg Hx     No Known Allergies  Objective: Vitals:   08/09/22  0917  BP: (!) 165/100  Pulse: 75  Temp: 97.7 F (36.5 C)  TempSrc: Oral  SpO2: 97%  Weight: 174 lb (78.9 kg)  Height: '5\' 10"'$  (1.778 m)   Body mass index is 24.97 kg/m.  Physical Exam Constitutional:      Comments: He is in good spirits as usual.  HENT:     Ears:     Comments: He is hard of hearing. Cardiovascular:     Rate and Rhythm: Normal rate and regular rhythm.     Heart sounds: No murmur heard. Pulmonary:     Effort: Pulmonary effort is normal.     Breath sounds: No wheezing, rhonchi or rales.  Psychiatric:        Mood and Affect: Mood normal.     Lab Results  Sputum AFB smear and culture hide this is here 10/05/2021 negative Sputum AFB smear negative but culture positive for Mycobacterium  05/03/2022   Problem List Items Addressed This Visit       High   Mycobacterium avium infection ([place]) - Primary    His most recent sputum AFB culture is positive again.  He is having some problems with adherence.  I will prescribed ondansetron for his nausea and encouraged him to take his medications every day.  I will get a repeat culture and, if positive again, obtain antibiotic susceptibilities.  He received his annual influenza vaccine and an updated COVID-vaccine here today.  He will also get lab work for CBC and CMP.  He will follow-up here in 2 months.      Relevant Medications   ondansetron (ZOFRAN) 4 MG tablet   Other Relevant Orders   CBC   Comprehensive metabolic panel   MYCOBACTERIA, CULTURE, WITH FLUOROCHROME SMEAR   COVID-19    He received an updated COVID-vaccine here today.        Unprioritized   Cigarette nicotine dependence with other nicotine-induced disorder    Unfortunately he has not made any progress on cutting down his cigarettes.  I encouraged him to recommit to this.       [name], MD [facility] for Infectious [place] [facility] [phone] pager   [phone] cell 08/09/2022, 9:39 AM

## 2022-08-09 NOTE — Addendum Note (Signed)
Addended by: Caffie Pinto on: 08/09/2022 09:41 AM   Modules accepted: Orders

## 2022-08-09 NOTE — Assessment & Plan Note (Signed)
His most recent sputum AFB culture is positive again.  He is having some problems with adherence.  I will prescribed ondansetron for his nausea and encouraged him to take his medications every day.  I will get a repeat culture and, if positive again, obtain antibiotic susceptibilities.  He received his annual influenza vaccine and an updated COVID-vaccine here today.  He will also get lab work for CBC and CMP.  He will follow-up here in 2 months.

## 2022-08-09 NOTE — Assessment & Plan Note (Signed)
Unfortunately he has not made any progress on cutting down his cigarettes.  I encouraged him to recommit to this.

## 2022-08-09 NOTE — Assessment & Plan Note (Signed)
He received an updated COVID-vaccine here today.

## 2022-08-10 LAB — COMPREHENSIVE METABOLIC PANEL
AG Ratio: 1.5 (calc) (ref 1.0–2.5)
ALT: 24 U/L (ref 9–46)
AST: 23 U/L (ref 10–35)
Albumin: 4.1 g/dL (ref 3.6–5.1)
Alkaline phosphatase (APISO): 92 U/L (ref 35–144)
BUN: 7 mg/dL (ref 7–25)
CO2: 28 mmol/L (ref 20–32)
Calcium: 9.6 mg/dL (ref 8.6–10.3)
Chloride: 103 mmol/L (ref 98–110)
Creat: 0.73 mg/dL (ref 0.70–1.35)
Globulin: 2.8 g/dL (calc) (ref 1.9–3.7)
Glucose, Bld: 93 mg/dL (ref 65–99)
Potassium: 4.8 mmol/L (ref 3.5–5.3)
Sodium: 140 mmol/L (ref 135–146)
Total Bilirubin: 0.3 mg/dL (ref 0.2–1.2)
Total Protein: 6.9 g/dL (ref 6.1–8.1)

## 2022-08-10 LAB — CBC
HCT: 47.6 % (ref 38.5–50.0)
Hemoglobin: 16.7 g/dL (ref 13.2–17.1)
MCH: 36.1 pg — ABNORMAL HIGH (ref 27.0–33.0)
MCHC: 35.1 g/dL (ref 32.0–36.0)
MCV: 102.8 fL — ABNORMAL HIGH (ref 80.0–100.0)
MPV: 10.7 fL (ref 7.5–12.5)
Platelets: 260 10*3/uL (ref 140–400)
RBC: 4.63 10*6/uL (ref 4.20–5.80)
RDW: 11.8 % (ref 11.0–15.0)
WBC: 9.9 10*3/uL (ref 3.8–10.8)

## 2022-09-19 LAB — MYCOBACTERIA,CULT W/FLUOROCHROME SMEAR

## 2022-10-11 ENCOUNTER — Other Ambulatory Visit: Payer: Self-pay

## 2022-10-11 ENCOUNTER — Encounter: Payer: Self-pay | Admitting: Internal Medicine

## 2022-10-11 ENCOUNTER — Ambulatory Visit (INDEPENDENT_AMBULATORY_CARE_PROVIDER_SITE_OTHER): Payer: Medicaid Other | Admitting: Internal Medicine

## 2022-10-11 VITALS — BP 151/90 | HR 79 | Temp 97.3°F | Ht 70.5 in | Wt 176.0 lb

## 2022-10-11 DIAGNOSIS — F1020 Alcohol dependence, uncomplicated: Secondary | ICD-10-CM | POA: Diagnosis not present

## 2022-10-11 DIAGNOSIS — A310 Pulmonary mycobacterial infection: Secondary | ICD-10-CM | POA: Diagnosis present

## 2022-10-11 DIAGNOSIS — F17218 Nicotine dependence, cigarettes, with other nicotine-induced disorders: Secondary | ICD-10-CM | POA: Diagnosis not present

## 2022-10-11 NOTE — Progress Notes (Signed)
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    Priority: High   Neck mass 09/23/2019   Gastritis and gastroduodenitis 01/10/2016   Esophageal reflux 12/05/2015   Colon cancer screening 10/07/2015   Chronic hepatitis C without hepatic coma ([place]) 09/06/2015   Alcoholism ([place]) 08/03/2015   Chronic obstructive pulmonary disease ([place]) 08/03/2015   Cigarette nicotine dependence with other nicotine-induced disorder 08/03/2015    Patient's Medications  New Prescriptions   No medications on file  Previous Medications   ALBUTEROL (PROVENTIL HFA;VENTOLIN HFA) 108 (90 BASE) MCG/ACT INHALER    Inhale 2 puffs into the lungs every 6 (six) hours as needed for wheezing or shortness of breath.   ALBUTEROL (PROVENTIL) (2.5 MG/3ML) 0.083% NEBULIZER SOLUTION    Take 3 mLs (2.5 mg total) by nebulization every 6 (six) hours as needed for wheezing or shortness of breath.   AZITHROMYCIN (ZITHROMAX) 500 MG TABLET    Take 1 tablet (500 mg total) by mouth daily.   CYANOCOBALAMIN (VITAMIN B-12 PO)    Take 1 tablet by mouth daily.   ETHAMBUTOL (MYAMBUTOL) 400 MG TABLET    Take 3 tablets (1,200 mg total) by mouth daily.   IPRATROPIUM-ALBUTEROL (DUONEB) 0.5-2.5 (3) MG/3ML SOLN    Inhale 3 mLs into the lungs 4 (four) times daily.   LISINOPRIL (PRINIVIL,ZESTRIL) 10 MG TABLET    Take 1 tablet (10 mg total) by mouth daily.   METOPROLOL SUCCINATE (TOPROL-XL) 50 MG 24 HR TABLET    Take 50 mg by mouth daily.   NALTREXONE (DEPADE) 50 MG TABLET    Take 50 mg by mouth daily.   ONDANSETRON (ZOFRAN) 4 MG TABLET    Take 1 tablet (4 mg total) by mouth every 8 (eight) hours as needed for nausea or vomiting.   RIFAMPIN (RIFADIN) 300 MG CAPSULE    Take 2 capsules (600 mg total) by mouth daily.   SPIRONOLACTONE (ALDACTONE) 25 MG TABLET    Take 25 mg by mouth daily.   SYMBICORT 160-4.5  MCG/ACT INHALER    Inhale 1 puff into the lungs 2 (two) times daily.  Modified Medications   No medications on file  Discontinued Medications   No medications on file    Subjective: He restarted azithromycin, ethambutol and rifampin in July of last year for his smoldering Mycobacterium avium pneumonia.  He quickly noticed improvement in his cough, sputum production and shortness of breath.  He says that he occasionally has some upset stomach when taking his antibiotics.  He estimated that he may have been missing his antibiotics 1 day each week before his last visit in December.  He says he has not missed any doses since that time.  He denies having any vomiting but has occasional nausea.  I prescribed ondansetron for him in December which he takes a few times each week with good results.  All recent sputum culture have been positive for Mycobacterium AVM.  The last on 08/09/2022.  Antibiotic susceptibilities are pending.  Unfortunately he has made no progress on his cigarette use and alcohol consumption.  He was prescribed naltrexone but stopped taking it because he said it had no effect on his cravings or alcohol intake.  Review of Systems: Review of Systems  Constitutional:  Negative for chills, diaphoresis and weight loss.  HENT:  Positive for hearing loss.  Respiratory:  Positive for cough, sputum production, shortness of breath and wheezing. Negative for hemoptysis.   Cardiovascular:  Negative for chest pain.  Gastrointestinal:  Negative for abdominal pain, diarrhea, nausea and vomiting.  Psychiatric/Behavioral:  Negative for depression.     Past Medical History:  Diagnosis Date   Arthritis    Asthma    COPD (chronic obstructive pulmonary disease) (HCC)    Emphysema lung (HCC)    Glaucoma    Hepatitis    C   Hypertension    Lung nodules    Bilateral   Substance abuse ([place])    Tuberculosis     Social History   Tobacco Use   Smoking status: Every Day    Packs/day: 1.00     Years: 40.00    Total pack years: 40.00    Types: Cigarettes   Smokeless tobacco: Never   Tobacco comments:    States thinking about quitting, but "old habits are hard to break"  Vaping Use   Vaping Use: Former  Substance Use Topics   Alcohol use: Yes    Comment: states 3-4 beers/day   Drug use: Yes    Types: Marijuana, Cocaine    Comment: Remote cocaine use, Current marjiuana use daily    Family History  Problem Relation Age of Onset   Cancer Mother        lymph nodes   Ulcers Father    Cholecystitis Father    Colon cancer Neg Hx    Colon polyps Neg Hx     No Known Allergies  Objective: Vitals:   10/11/22 0904 10/11/22 0919  BP: (!) 158/98 (!) 151/90  Pulse: 81 79  Temp: (!) 97.3 F (36.3 C)   TempSrc: Temporal   SpO2: 93%   Weight: 176 lb (79.8 kg)   Height: 5' 10.5" (1.791 m)    Body mass index is 24.9 kg/m.  Physical Exam Constitutional:      Comments: He is in good spirits as usual.  He is hard of hearing.  HENT:     Ears:     Comments: He is hard of hearing. Cardiovascular:     Rate and Rhythm: Normal rate and regular rhythm.     Heart sounds: No murmur heard. Pulmonary:     Effort: Pulmonary effort is normal.     Breath sounds: Wheezing present. No rhonchi or rales.  Psychiatric:        Mood and Affect: Mood normal.     Lab Results  Sputum AFB smear and culture hide this is here 10/05/2021 negative Sputum AFB smear negative but culture positive for Mycobacterium  05/03/2022 Sputum AFB smear negative but culture positive for Mycobacterium 08/09/2022   Problem List Items Addressed This Visit       High   Mycobacterium avium infection ([place]) - Primary    He has had fairly good clinical improvement since restarting therapy last summer.  Unfortunately he continues to have problems adhering to his regimen and I am worried that he may be evolving some resistance.  Antibiotic susceptibilities are pending.  He will continue his current 3 drug regimen  for now, get blood work today and submit another sputum specimen for AFB smear and culture.  He will follow-up in 4 weeks.      Relevant Orders   CBC   Comprehensive metabolic panel   MYCOBACTERIA, CULTURE, WITH FLUOROCHROME SMEAR     Unprioritized   Alcoholism ([place])    He jokingly told me that "I have  learned that there is no magic pill for alcoholism".  He told me that he was going to keep trying to cut down.      Cigarette nicotine dependence with other nicotine-induced disorder    He is not currently ready to try to quit smoking.      [name], MD [facility] [place] [facility] [phone] pager   [phone] cell 10/11/2022, 9:20 AM

## 2022-10-11 NOTE — Assessment & Plan Note (Signed)
He has had fairly good clinical improvement since restarting therapy last summer.  Unfortunately he continues to have problems adhering to his regimen and I am worried that he may be evolving some resistance.  Antibiotic susceptibilities are pending.  He will continue his current 3 drug regimen for now, get blood work today and submit another sputum specimen for AFB smear and culture.  He will follow-up in 4 weeks.

## 2022-10-11 NOTE — Assessment & Plan Note (Signed)
He jokingly told me that "I have learned that there is no magic pill for alcoholism".  He told me that he was going to keep trying to cut down.

## 2022-10-11 NOTE — Assessment & Plan Note (Signed)
He is not currently ready to try to quit smoking.

## 2022-10-14 LAB — MYCOBACTERIA,CULT W/FLUOROCHROME SMEAR
MICRO NUMBER:: 14313697
SMEAR:: NONE SEEN
SPECIMEN QUALITY:: ADEQUATE

## 2022-10-14 LAB — M. AVIUM MIC PANEL
AMIKACIN (LIPOSOMAL, INHALED): 32 ug/mL
AMIKACIN: 32 ug/mL
CIPROFLOXACIN: >8 ug/mL
CLARITHROMYCIN: 4 ug/mL
CLOFAZIMINE: 0.25 ug/mL
DOXYCYCLINE: >8 ug/mL
LINEZOLID: 32 ug/mL
MINOCYCLINE: >8 ug/mL
MOXIFLOXACIN: 4 ug/mL
RIFABUTIN: 0.5 ug/mL
RIFAMPIN: >4 ug/mL
STREPTOMYCIN: >32 ug/mL

## 2022-11-09 ENCOUNTER — Ambulatory Visit: Payer: Medicaid Other | Admitting: Internal Medicine

## 2022-11-16 ENCOUNTER — Ambulatory Visit (INDEPENDENT_AMBULATORY_CARE_PROVIDER_SITE_OTHER): Payer: Medicaid Other | Admitting: Internal Medicine

## 2022-11-16 ENCOUNTER — Other Ambulatory Visit: Payer: Self-pay

## 2022-11-16 ENCOUNTER — Encounter: Payer: Self-pay | Admitting: Internal Medicine

## 2022-11-16 VITALS — BP 145/90 | HR 78 | Temp 97.5°F | Ht 70.0 in | Wt 176.2 lb

## 2022-11-16 DIAGNOSIS — A310 Pulmonary mycobacterial infection: Secondary | ICD-10-CM

## 2022-11-16 NOTE — Assessment & Plan Note (Signed)
He has had significant clinical improvement since restarting antibiotic therapy last July.  He will continue therapy for now and follow-up in 1 month.

## 2022-11-16 NOTE — Progress Notes (Signed)
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    Priority: High   Neck mass 09/23/2019   Gastritis and gastroduodenitis 01/10/2016   Esophageal reflux 12/05/2015   Colon cancer screening 10/07/2015   Chronic hepatitis C without hepatic coma ([place]) 09/06/2015   Alcoholism ([place]) 08/03/2015   Chronic obstructive pulmonary disease ([employer]) 08/03/2015   Cigarette nicotine dependence with other nicotine-induced disorder 08/03/2015    Patient's Medications  New Prescriptions   No medications on file  Previous Medications   ALBUTEROL (PROVENTIL HFA;VENTOLIN HFA) 108 (90 BASE) MCG/ACT INHALER    Inhale 2 puffs into the lungs every 6 (six) hours as needed for wheezing or shortness of breath.   ALBUTEROL (PROVENTIL) (2.5 MG/3ML) 0.083% NEBULIZER SOLUTION    Take 3 mLs (2.5 mg total) by nebulization every 6 (six) hours as needed for wheezing or shortness of breath.   AZITHROMYCIN (ZITHROMAX) 500 MG TABLET    Take 1 tablet (500 mg total) by mouth daily.   CYANOCOBALAMIN (VITAMIN B-12 PO)    Take 1 tablet by mouth daily.   ETHAMBUTOL (MYAMBUTOL) 400 MG TABLET    Take 3 tablets (1,200 mg total) by mouth daily.   IPRATROPIUM-ALBUTEROL (DUONEB) 0.5-2.5 (3) MG/3ML SOLN    Inhale 3 mLs into the lungs 4 (four) times daily.   LISINOPRIL (PRINIVIL,ZESTRIL) 10 MG TABLET    Take 1 tablet (10 mg total) by mouth daily.   METOPROLOL SUCCINATE (TOPROL-XL) 50 MG 24 HR TABLET    Take 50 mg by mouth daily.   NALTREXONE (DEPADE) 50 MG TABLET    Take 50 mg by mouth daily.   ONDANSETRON (ZOFRAN) 4 MG TABLET    Take 1 tablet (4 mg total) by mouth every 8 (eight) hours as needed for nausea or vomiting.   RIFAMPIN (RIFADIN) 300 MG CAPSULE    Take 2 capsules (600 mg total) by mouth daily.   SPIRONOLACTONE (ALDACTONE) 25 MG TABLET    Take 25 mg by mouth daily.   SYMBICORT 160-4.5 MCG/ACT INHALER    Inhale 1 puff into  the lungs 2 (two) times daily.  Modified Medications   No medications on file  Discontinued Medications   No medications on file    Subjective: Derek [name] is in for his routine follow-up visit.  He is a heavy smoker with COPD who has had smoldering Mycobacterium avium pneumonia.  When he first started treatment his adherence was very poor.  He developed COVID pneumonia last year and decided to restart therapy with azithromycin, ethambutol and rifampin in July.  He had significant improvement in his chronic cough and sputum production.  His sputum culture in December grew Mycobacterium avium again.  It was still sensitive to macrolides.  A repeat culture on 10/11/2021 is negative so far.  He has had no problems tolerating his antibiotics.  Review of Systems: Review of Systems  Constitutional:  Negative for fever and weight loss.  Respiratory:  Positive for cough, sputum production, shortness of breath and wheezing. Negative for hemoptysis.   Cardiovascular:  Negative for chest pain.  Gastrointestinal:  Negative for abdominal pain, diarrhea, nausea and vomiting.    Past Medical History:  Diagnosis Date   Arthritis    Asthma    COPD (chronic obstructive pulmonary disease) (HCC)    Emphysema lung (HCC)    Glaucoma    Hepatitis  C   Hypertension    Lung nodules    Bilateral   Substance abuse (HCC)    Tuberculosis     Social History   Tobacco Use   Smoking status: Every Day    Packs/day: 1.00    Years: 40.00    Additional pack years: 0.00    Total pack years: 40.00    Types: Cigarettes   Smokeless tobacco: Never   Tobacco comments:    States thinking about quitting, but "old habits are hard to break"  Vaping Use   Vaping Use: Former  Substance Use Topics   Alcohol use: Yes    Comment: states 3-4 beers/day   Drug use: Yes    Types: Marijuana, Cocaine    Comment: Remote cocaine use, Current marjiuana use daily    Family History  Problem Relation Age of Onset   Cancer  Mother        lymph nodes   Ulcers Father    Cholecystitis Father    Colon cancer Neg Hx    Colon polyps Neg Hx     No Known Allergies  Health Maintenance  Topic Date Due   HIV Screening  Never done   DTaP/Tdap/Td (1 - Tdap) Never done   Zoster Vaccines- Shingrix (1 of 2) Never done   Lung Cancer Screening  11/08/2018   COVID-19 Vaccine (5 - 2023-24 season) 10/04/2022   COLONOSCOPY (Pts 45-58yrs Insurance coverage will need to be confirmed)  11/08/2025   INFLUENZA VACCINE  Completed   Hepatitis C Screening  Completed   HPV VACCINES  Aged Out    Objective:  Vitals:   11/16/22 0855 11/16/22 0906  BP: (!) 154/85 (!) 145/90  Pulse: (!) 118 78  Temp: (!) 97.5 F (36.4 C)   TempSrc: Oral   SpO2: (!) 89% 94%  Weight: 176 lb 3.2 oz (79.9 kg)   Height: 5\' 10"  (1.778 m)    Body mass index is 25.28 kg/m.  Physical Exam Constitutional:      Comments: His spirits are good.  Cardiovascular:     Rate and Rhythm: Normal rate and regular rhythm.     Heart sounds: No murmur heard. Pulmonary:     Effort: Pulmonary effort is normal.     Breath sounds: No wheezing, rhonchi or rales.     Comments: He has very distant breath sounds. Psychiatric:        Mood and Affect: Mood normal.     Lab Results Lab Results  Component Value Date   WBC 9.5 10/11/2022   HGB 17.6 (H) 10/11/2022   HCT 50.4 (H) 10/11/2022   MCV 102.2 (H) 10/11/2022   PLT 301 10/11/2022    Lab Results  Component Value Date   CREATININE 0.75 10/11/2022   BUN 6 (L) 10/11/2022   NA 141 10/11/2022   K 4.9 10/11/2022   CL 103 10/11/2022   CO2 31 10/11/2022    Lab Results  Component Value Date   ALT 17 10/11/2022   AST 23 10/11/2022   ALKPHOS 106 10/31/2017   BILITOT 0.5 10/11/2022    Lab Results  Component Value Date   CHOL 127 05/02/2017   HDL 42 05/02/2017   LDLCALC 74 05/02/2017   TRIG 54 05/02/2017   CHOLHDL 3.0 05/02/2017   No results found for: "LABRPR", "RPRTITER" No results found for:  "HIV1RNAQUANT", "HIV1RNAVL", "CD4TABS"   Problem List Items Addressed This Visit       High   Mycobacterium avium infection ([place]) -  Primary    He has had significant clinical improvement since restarting antibiotic therapy last July.  He will continue therapy for now and follow-up in 1 month.         [name], MD [facility] for [place] [facility] [phone] pager   [phone] cell 11/16/2022, 9:13 AM

## 2022-11-27 LAB — MYCOBACTERIA,CULT W/FLUOROCHROME SMEAR
MICRO NUMBER:: 14567390
SMEAR:: NONE SEEN
SPECIMEN QUALITY:: ADEQUATE

## 2022-11-27 LAB — CBC
HCT: 50.4 % — ABNORMAL HIGH (ref 38.5–50.0)
Hemoglobin: 17.6 g/dL — ABNORMAL HIGH (ref 13.2–17.1)
MCH: 35.7 pg — ABNORMAL HIGH (ref 27.0–33.0)
MCHC: 34.9 g/dL (ref 32.0–36.0)
MCV: 102.2 fL — ABNORMAL HIGH (ref 80.0–100.0)
MPV: 10.4 fL (ref 7.5–12.5)
Platelets: 301 10*3/uL (ref 140–400)
RBC: 4.93 10*6/uL (ref 4.20–5.80)
RDW: 12.4 % (ref 11.0–15.0)
WBC: 9.5 10*3/uL (ref 3.8–10.8)

## 2022-11-27 LAB — COMPREHENSIVE METABOLIC PANEL
AG Ratio: 1.4 (calc) (ref 1.0–2.5)
ALT: 17 U/L (ref 9–46)
AST: 23 U/L (ref 10–35)
Albumin: 4.2 g/dL (ref 3.6–5.1)
Alkaline phosphatase (APISO): 89 U/L (ref 35–144)
BUN/Creatinine Ratio: 8 (calc) (ref 6–22)
BUN: 6 mg/dL — ABNORMAL LOW (ref 7–25)
CO2: 31 mmol/L (ref 20–32)
Calcium: 9.9 mg/dL (ref 8.6–10.3)
Chloride: 103 mmol/L (ref 98–110)
Creat: 0.75 mg/dL (ref 0.70–1.35)
Globulin: 3 g/dL (calc) (ref 1.9–3.7)
Glucose, Bld: 96 mg/dL (ref 65–99)
Potassium: 4.9 mmol/L (ref 3.5–5.3)
Sodium: 141 mmol/L (ref 135–146)
Total Bilirubin: 0.5 mg/dL (ref 0.2–1.2)
Total Protein: 7.2 g/dL (ref 6.1–8.1)

## 2022-12-14 ENCOUNTER — Other Ambulatory Visit: Payer: Self-pay

## 2022-12-14 ENCOUNTER — Encounter: Payer: Self-pay | Admitting: Internal Medicine

## 2022-12-14 ENCOUNTER — Ambulatory Visit (INDEPENDENT_AMBULATORY_CARE_PROVIDER_SITE_OTHER): Payer: Medicaid Other | Admitting: Internal Medicine

## 2022-12-14 DIAGNOSIS — A310 Pulmonary mycobacterial infection: Secondary | ICD-10-CM

## 2022-12-14 MED ORDER — RIFAMPIN 300 MG PO CAPS: 600.0000 mg | ORAL_CAPSULE | Freq: Every day | ORAL | 11 refills | Status: DC

## 2022-12-14 MED ORDER — ETHAMBUTOL HCL 400 MG PO TABS: 1200.0000 mg | ORAL_TABLET | Freq: Every day | ORAL | 11 refills | Status: DC

## 2022-12-14 MED ORDER — AZITHROMYCIN 500 MG PO TABS: 500.0000 mg | ORAL_TABLET | Freq: Every day | ORAL | 11 refills | Status: DC

## 2022-12-14 NOTE — Progress Notes (Signed)
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    Priority: High   Neck mass 09/23/2019   Gastritis and gastroduodenitis 01/10/2016   Esophageal reflux 12/05/2015   Colon cancer screening 10/07/2015   Chronic hepatitis C without hepatic coma 09/06/2015   Alcoholism 08/03/2015   Chronic obstructive pulmonary disease 08/03/2015   Cigarette nicotine dependence with other nicotine-induced disorder 08/03/2015    Patient's Medications  New Prescriptions   No medications on file  Previous Medications   ALBUTEROL (PROVENTIL HFA;VENTOLIN HFA) 108 (90 BASE) MCG/ACT INHALER    Inhale 2 puffs into the lungs every 6 (six) hours as needed for wheezing or shortness of breath.   ALBUTEROL (PROVENTIL) (2.5 MG/3ML) 0.083% NEBULIZER SOLUTION    Take 3 mLs (2.5 mg total) by nebulization every 6 (six) hours as needed for wheezing or shortness of breath.   CYANOCOBALAMIN (VITAMIN B-12 PO)    Take 1 tablet by mouth daily.   IPRATROPIUM-ALBUTEROL (DUONEB) 0.5-2.5 (3) MG/3ML SOLN    Inhale 3 mLs into the lungs 4 (four) times daily.   LISINOPRIL (PRINIVIL,ZESTRIL) 10 MG TABLET    Take 1 tablet (10 mg total) by mouth daily.   METOPROLOL SUCCINATE (TOPROL-XL) 50 MG 24 HR TABLET    Take 50 mg by mouth daily.   NALTREXONE (DEPADE) 50 MG TABLET    Take 50 mg by mouth daily.   ONDANSETRON (ZOFRAN) 4 MG TABLET    Take 1 tablet (4 mg total) by mouth every 8 (eight) hours as needed for nausea or vomiting.   SPIRONOLACTONE (ALDACTONE) 25 MG TABLET    Take 25 mg by mouth daily.   SYMBICORT 160-4.5 MCG/ACT INHALER    Inhale 1 puff into the lungs 2 (two) times daily.  Modified Medications   Modified Medication Previous Medication   AZITHROMYCIN (ZITHROMAX) 500 MG TABLET azithromycin (ZITHROMAX) 500 MG tablet      Take 1 tablet (500 mg total) by mouth daily.    Take 1 tablet (500 mg total) by mouth daily.   ETHAMBUTOL  (MYAMBUTOL) 400 MG TABLET ethambutol (MYAMBUTOL) 400 MG tablet      Take 3 tablets (1,200 mg total) by mouth daily.    Take 3 tablets (1,200 mg total) by mouth daily.   RIFAMPIN (RIFADIN) 300 MG CAPSULE rifampin (RIFADIN) 300 MG capsule      Take 2 capsules (600 mg total) by mouth daily.    Take 2 capsules (600 mg total) by mouth daily.  Discontinued Medications   No medications on file    Subjective: Ajit is in for his routine follow-up visit.  He is a heavy smoker with COPD who has had smoldering Mycobacterium avium pneumonia.  When he first started treatment his adherence was very poor.  He developed COVID pneumonia last year and decided to restart therapy with azithromycin, ethambutol and rifampin in July.  He had significant improvement in his chronic cough and sputum production.  His sputum culture in December grew Mycobacterium avium again.  It was still sensitive to macrolides but probably resistant to rifampin.  A repeat culture on 10/11/2021 i was negative.  He has had no problems tolerating his antibiotics.  He tells me that he has not made any progress with his alcohol use.  Review of Systems: Review of Systems  Constitutional:  Negative for fever and weight loss.  Respiratory:  Positive  for cough, sputum production, shortness of breath and wheezing. Negative for hemoptysis.   Cardiovascular:  Negative for chest pain.  Gastrointestinal:  Negative for abdominal pain, diarrhea, nausea and vomiting.    Past Medical History:  Diagnosis Date   Arthritis    Asthma    COPD (chronic obstructive pulmonary disease)    Emphysema lung    Glaucoma    Hepatitis    C   Hypertension    Lung nodules    Bilateral   Substance abuse    Tuberculosis     Social History   Tobacco Use   Smoking status: Every Day    Packs/day: 1.00    Years: 40.00    Additional pack years: 0.00    Total pack years: 40.00    Types: Cigarettes   Smokeless tobacco: Never   Tobacco comments:    States  thinking about quitting, but "old habits are hard to break"  Vaping Use   Vaping Use: Former  Substance Use Topics   Alcohol use: Yes    Comment: states 3-4 beers/day   Drug use: Yes    Types: Marijuana, Cocaine    Comment: Remote cocaine use, Current marjiuana use daily    Family History  Problem Relation Age of Onset   Cancer Mother        lymph nodes   Ulcers Father    Cholecystitis Father    Colon cancer Neg Hx    Colon polyps Neg Hx     No Known Allergies  Health Maintenance  Topic Date Due   HIV Screening  Never done   DTaP/Tdap/Td (1 - Tdap) Never done   Zoster Vaccines- Shingrix (1 of 2) Never done   Lung Cancer Screening  11/08/2018   COVID-19 Vaccine (5 - 2023-24 season) 10/04/2022   INFLUENZA VACCINE  03/29/2023   COLONOSCOPY (Pts 45-74yrs Insurance coverage will need to be confirmed)  11/08/2025   Hepatitis C Screening  Completed   HPV VACCINES  Aged Out    Objective:  Vitals:   12/14/22 0845  BP: (!) 159/93  Pulse: 77  Temp: (!) 97.5 F (36.4 C)  TempSrc: Temporal  SpO2: 95%  Weight: 174 lb (78.9 kg)  Height: 5' 10.5" (1.791 m)   Body mass index is 24.61 kg/m.  Physical Exam Constitutional:      Comments: His spirits are good.  Cardiovascular:     Rate and Rhythm: Normal rate and regular rhythm.     Heart sounds: No murmur heard. Pulmonary:     Effort: Pulmonary effort is normal.     Breath sounds: No wheezing, rhonchi or rales.     Comments: He has very distant breath sounds with some crackles in his right lung base posteriorly. Psychiatric:        Mood and Affect: Mood normal.     Lab Results Lab Results  Component Value Date   WBC 9.5 10/11/2022   HGB 17.6 (H) 10/11/2022   HCT 50.4 (H) 10/11/2022   MCV 102.2 (H) 10/11/2022   PLT 301 10/11/2022    Lab Results  Component Value Date   CREATININE 0.75 10/11/2022   BUN 6 (L) 10/11/2022   NA 141 10/11/2022   K 4.9 10/11/2022   CL 103 10/11/2022   CO2 31 10/11/2022    Lab  Results  Component Value Date   ALT 17 10/11/2022   AST 23 10/11/2022   ALKPHOS 106 10/31/2017   BILITOT 0.5 10/11/2022    Lab Results  Component Value Date   CHOL 127 05/02/2017   HDL 42 05/02/2017   LDLCALC 74 05/02/2017   TRIG 54 05/02/2017   CHOLHDL 3.0 05/02/2017   No results found for: "LABRPR", "RPRTITER" No results found for: "HIV1RNAQUANT", "HIV1RNAVL", "CD4TABS"   Problem List Items Addressed This Visit       High   Mycobacterium avium infection    I favor continuing his current 3 drug regimen since she has had good clinical and now microbiologic improvement.  He will follow-up in 2 months.      Relevant Medications   azithromycin (ZITHROMAX) 500 MG tablet   ethambutol (MYAMBUTOL) 400 MG tablet   rifampin (RIFADIN) 300 MG capsule      Cliffton Asters, MD [facility] for Infectious Disease [facility] [phone] pager   [phone] cell 12/14/2022, 8:55 AM

## 2022-12-14 NOTE — Assessment & Plan Note (Signed)
I favor continuing his current 3 drug regimen since she has had good clinical and now microbiologic improvement.  He will follow-up in 2 months.

## 2023-02-01 ENCOUNTER — Encounter: Payer: Self-pay | Admitting: Internal Medicine

## 2023-02-01 ENCOUNTER — Ambulatory Visit (INDEPENDENT_AMBULATORY_CARE_PROVIDER_SITE_OTHER): Payer: Medicaid Other | Admitting: Internal Medicine

## 2023-02-01 ENCOUNTER — Other Ambulatory Visit: Payer: Self-pay

## 2023-02-01 VITALS — BP 139/95 | HR 81 | Temp 97.7°F | Ht 71.0 in | Wt 168.0 lb

## 2023-02-01 DIAGNOSIS — F17218 Nicotine dependence, cigarettes, with other nicotine-induced disorders: Secondary | ICD-10-CM

## 2023-02-01 DIAGNOSIS — Z5181 Encounter for therapeutic drug level monitoring: Secondary | ICD-10-CM

## 2023-02-01 DIAGNOSIS — A310 Pulmonary mycobacterial infection: Secondary | ICD-10-CM | POA: Diagnosis present

## 2023-02-01 NOTE — Assessment & Plan Note (Signed)
He has improved overall with his symptomatic M avium pulmonary infection.  Last sputum was negative in February and therefore will plan on continuing him on this through February 2025 and consider stopping after that.   Rtc in 3 months.

## 2023-02-01 NOTE — Assessment & Plan Note (Signed)
Will check his CMP and cbc on treatment.

## 2023-02-01 NOTE — Assessment & Plan Note (Signed)
I discussed the extreme importance in smoking cessation and he is struggling with quitting.  Encouraged and emphasized that the treatment for the MAI will only partially help his symptoms in the long run.

## 2023-02-01 NOTE — Progress Notes (Signed)
   Subjective:    Patient ID: Derek [name], male    DOB: [birth year], 62 y.o.   MRN: [id number]  HPI Derek [name] is here for follow up of M avium pulmonary infection. He has continued on a three drug regimen with rifampin, azithromycin and ethambutol and tolerating well.  He has no complaints.  Still smoking, some alcohol still.  No night sweats.    Review of Systems  Constitutional:  Negative for chills, fatigue and fever.  Gastrointestinal:  Negative for diarrhea and nausea.  Skin:  Negative for rash.       Objective:   Physical Exam Eyes:     General: No scleral icterus. Pulmonary:     Effort: Pulmonary effort is normal.  Neurological:     Mental Status: He is alert.   SH: + tobacco and alcohol        Assessment & Plan:

## 2023-02-02 LAB — COMPLETE METABOLIC PANEL WITH GFR
AG Ratio: 1.3 (calc) (ref 1.0–2.5)
ALT: 17 U/L (ref 9–46)
AST: 20 U/L (ref 10–35)
Albumin: 3.8 g/dL (ref 3.6–5.1)
Alkaline phosphatase (APISO): 101 U/L (ref 35–144)
BUN/Creatinine Ratio: 8 (calc) (ref 6–22)
BUN: 6 mg/dL — ABNORMAL LOW (ref 7–25)
CO2: 30 mmol/L (ref 20–32)
Calcium: 9.1 mg/dL (ref 8.6–10.3)
Chloride: 95 mmol/L — ABNORMAL LOW (ref 98–110)
Creat: 0.79 mg/dL (ref 0.70–1.35)
Globulin: 3 g/dL (calc) (ref 1.9–3.7)
Glucose, Bld: 107 mg/dL — ABNORMAL HIGH (ref 65–99)
Potassium: 3.9 mmol/L (ref 3.5–5.3)
Sodium: 133 mmol/L — ABNORMAL LOW (ref 135–146)
Total Bilirubin: 0.4 mg/dL (ref 0.2–1.2)
Total Protein: 6.8 g/dL (ref 6.1–8.1)
eGFR: 100 mL/min/{1.73_m2} (ref >=60)

## 2023-02-02 LAB — CBC WITH DIFFERENTIAL/PLATELET
Absolute Monocytes: 1151 cells/uL — ABNORMAL HIGH (ref 200–950)
Basophils Absolute: 67 cells/uL (ref 0–200)
Basophils Relative: 0.8 %
Eosinophils Absolute: 50 cells/uL (ref 15–500)
Eosinophils Relative: 0.6 %
HCT: 50.1 % — ABNORMAL HIGH (ref 38.5–50.0)
Hemoglobin: 17.7 g/dL — ABNORMAL HIGH (ref 13.2–17.1)
Lymphs Abs: 1092 cells/uL (ref 850–3900)
MCH: 35.9 pg — ABNORMAL HIGH (ref 27.0–33.0)
MCHC: 35.3 g/dL (ref 32.0–36.0)
MCV: 101.6 fL — ABNORMAL HIGH (ref 80.0–100.0)
MPV: 10.8 fL (ref 7.5–12.5)
Monocytes Relative: 13.7 %
Neutro Abs: 6040 cells/uL (ref 1500–7800)
Neutrophils Relative %: 71.9 %
Platelets: 164 10*3/uL (ref 140–400)
RBC: 4.93 10*6/uL (ref 4.20–5.80)
RDW: 11.8 % (ref 11.0–15.0)
Total Lymphocyte: 13 %
WBC: 8.4 10*3/uL (ref 3.8–10.8)

## 2023-03-19 ENCOUNTER — Telehealth: Payer: Self-pay

## 2023-03-19 NOTE — Telephone Encounter (Signed)
Received fax from [facility] regarding DDI between rifampin and methylprednisolone.   Methylprednisolone was dispensed 03/06/23 (4mg  21 tabs for a 6 day supply) by Lia Hopping.   Sandie Ano, RN

## 2023-03-19 NOTE — Telephone Encounter (Signed)
Sounds good. Not too worried about it, especially since he should be done. Thanks!

## 2023-05-02 ENCOUNTER — Other Ambulatory Visit: Payer: Self-pay

## 2023-05-02 ENCOUNTER — Ambulatory Visit (INDEPENDENT_AMBULATORY_CARE_PROVIDER_SITE_OTHER): Payer: Medicaid Other | Admitting: Internal Medicine

## 2023-05-02 ENCOUNTER — Encounter: Payer: Self-pay | Admitting: Internal Medicine

## 2023-05-02 VITALS — BP 161/95 | HR 73 | Temp 97.9°F | Resp 16 | Wt 170.8 lb

## 2023-05-02 DIAGNOSIS — Z5181 Encounter for therapeutic drug level monitoring: Secondary | ICD-10-CM

## 2023-05-02 DIAGNOSIS — F17218 Nicotine dependence, cigarettes, with other nicotine-induced disorders: Secondary | ICD-10-CM

## 2023-05-02 DIAGNOSIS — A310 Pulmonary mycobacterial infection: Secondary | ICD-10-CM | POA: Diagnosis present

## 2023-05-02 NOTE — Assessment & Plan Note (Signed)
He is off his medications temporarily due to his scheduled CT scan per their recommendations.  He will start after getting the CT scan.  I am unclear of the rationale Rtc in 3 months.

## 2023-05-02 NOTE — Assessment & Plan Note (Signed)
Will check his hepatic function panel and cbc

## 2023-05-02 NOTE — Assessment & Plan Note (Signed)
Discussed the importance of cessation again

## 2023-05-02 NOTE — Progress Notes (Signed)
   Subjective:    Patient ID: Derek [name], male    DOB: June 14, 1961, 62 y.o.   MRN: [id number]  HPI Derek [name] is here for follow up of pulmonary MAI infection.  He has continued on a 3-drug regimen with rifampin, azithromycin and ethambutol but recently stopped because he is scheduled or a CT scan and was told he can't be on azithromycin for the CT scan. He otherwise has no new issues.  No fever, no night sweats.     Review of Systems  Constitutional:  Negative for fatigue.  Gastrointestinal:  Negative for diarrhea.  Skin:  Negative for rash.       Objective:   Physical Exam Eyes:     General: No scleral icterus. Pulmonary:     Effort: Pulmonary effort is normal.  Skin:    Findings: No rash.  Neurological:     Mental Status: He is alert.   SH: + tobacco and alcohol         Assessment & Plan:

## 2023-05-03 LAB — HEPATIC FUNCTION PANEL
AG Ratio: 1.4 (calc) (ref 1.0–2.5)
ALT: 23 U/L (ref 9–46)
AST: 33 U/L (ref 10–35)
Albumin: 4 g/dL (ref 3.6–5.1)
Alkaline phosphatase (APISO): 107 U/L (ref 35–144)
Bilirubin, Direct: 0.3 mg/dL — ABNORMAL HIGH (ref 0.0–0.2)
Globulin: 2.9 g/dL (ref 1.9–3.7)
Indirect Bilirubin: 0.5 mg/dL (ref 0.2–1.2)
Total Bilirubin: 0.8 mg/dL (ref 0.2–1.2)
Total Protein: 6.9 g/dL (ref 6.1–8.1)

## 2023-05-03 LAB — CBC
HCT: 48.4 % (ref 38.5–50.0)
Hemoglobin: 16.7 g/dL (ref 13.2–17.1)
MCH: 35.4 pg — ABNORMAL HIGH (ref 27.0–33.0)
MCHC: 34.5 g/dL (ref 32.0–36.0)
MCV: 102.5 fL — ABNORMAL HIGH (ref 80.0–100.0)
MPV: 10.6 fL (ref 7.5–12.5)
Platelets: 265 10*3/uL (ref 140–400)
RBC: 4.72 10*6/uL (ref 4.20–5.80)
RDW: 12.2 % (ref 11.0–15.0)
WBC: 8.5 10*3/uL (ref 3.8–10.8)

## 2023-08-17 ENCOUNTER — Ambulatory Visit: Payer: Medicaid Other | Admitting: Internal Medicine

## 2023-08-17 ENCOUNTER — Encounter: Payer: Self-pay | Admitting: Internal Medicine

## 2023-08-17 ENCOUNTER — Other Ambulatory Visit: Payer: Self-pay

## 2023-08-17 VITALS — BP 150/88 | HR 92 | Temp 97.5°F | Wt 168.0 lb

## 2023-08-17 DIAGNOSIS — F17218 Nicotine dependence, cigarettes, with other nicotine-induced disorders: Secondary | ICD-10-CM | POA: Diagnosis not present

## 2023-08-17 DIAGNOSIS — Z5181 Encounter for therapeutic drug level monitoring: Secondary | ICD-10-CM

## 2023-08-17 DIAGNOSIS — F1020 Alcohol dependence, uncomplicated: Secondary | ICD-10-CM

## 2023-08-17 DIAGNOSIS — J42 Unspecified chronic bronchitis: Secondary | ICD-10-CM

## 2023-08-17 DIAGNOSIS — J449 Chronic obstructive pulmonary disease, unspecified: Secondary | ICD-10-CM

## 2023-08-17 DIAGNOSIS — A310 Pulmonary mycobacterial infection: Secondary | ICD-10-CM | POA: Diagnosis present

## 2023-08-17 NOTE — Assessment & Plan Note (Signed)
Discussed the need for cessation.

## 2023-08-17 NOTE — Assessment & Plan Note (Signed)
He has been followed for a prolonged period for this and on treatment over 18 months and Feb will be one year from a negative sputum so will have him stop after that.   I discussed that regardless of symptoms after stopping, I do not recommend he continue with the three drug treatment for a more prolonged period as he will have had a maximum benefit from treatment

## 2023-08-17 NOTE — Assessment & Plan Note (Signed)
Will check his hepatic function panel with his regimen and alcohol use

## 2023-08-17 NOTE — Patient Instructions (Addendum)
Continue taking your three medications until February then stop the azithromycin, stop the ethambutol and stop rifampin.   Will arrange an appointment with a pulmonary specialist

## 2023-08-17 NOTE — Assessment & Plan Note (Signed)
He has ongoing issues with cough and breathing as expected with ongoing tobacco use At this point, I will refer him to pulmonary for evaluation of appropriate treatment for his ongoing symptoms.

## 2023-08-17 NOTE — Progress Notes (Signed)
   Subjective:    Patient ID: Derek [name], male    DOB: [birth year], 62 y.o.   MRN: [id number]  HPI Mr. Speelman is here for follow up of pulmonary MAI infection.  He has continued on a 3-drug regimen with rifampin, azithromycin and ethambutol minus two months he was off to get a screening CT.  He felt better in regards to cough and congestion once off the medication.  His last AFB sputum in Feb was negative and plan to continue through Feb and stop.     Review of Systems  Constitutional:  Negative for fever.  Respiratory:  Positive for cough and wheezing.   Gastrointestinal:  Negative for diarrhea.  Skin:  Negative for rash.       Objective:   Physical Exam Eyes:     General: No scleral icterus. Pulmonary:     Effort: Pulmonary effort is normal.  Skin:    Findings: No rash.  Neurological:     Mental Status: He is alert.   SH: + tobacco and alcohol        Assessment & Plan:

## 2023-08-18 LAB — CBC WITH DIFFERENTIAL/PLATELET
Absolute Lymphocytes: 1792 {cells}/uL (ref 850–3900)
Absolute Monocytes: 1064 {cells}/uL — ABNORMAL HIGH (ref 200–950)
Basophils Absolute: 146 {cells}/uL (ref 0–200)
Basophils Relative: 1.3 %
Eosinophils Absolute: 202 {cells}/uL (ref 15–500)
Eosinophils Relative: 1.8 %
HCT: 54.4 % — ABNORMAL HIGH (ref 38.5–50.0)
Hemoglobin: 18.8 g/dL — ABNORMAL HIGH (ref 13.2–17.1)
MCH: 35.1 pg — ABNORMAL HIGH (ref 27.0–33.0)
MCHC: 34.6 g/dL (ref 32.0–36.0)
MCV: 101.5 fL — ABNORMAL HIGH (ref 80.0–100.0)
MPV: 10.3 fL (ref 7.5–12.5)
Monocytes Relative: 9.5 %
Neutro Abs: 7997 {cells}/uL — ABNORMAL HIGH (ref 1500–7800)
Neutrophils Relative %: 71.4 %
Platelets: 286 10*3/uL (ref 140–400)
RBC: 5.36 10*6/uL (ref 4.20–5.80)
RDW: 11.5 % (ref 11.0–15.0)
Total Lymphocyte: 16 %
WBC: 11.2 10*3/uL — ABNORMAL HIGH (ref 3.8–10.8)

## 2023-08-18 LAB — HEPATIC FUNCTION PANEL
AG Ratio: 1.3 (calc) (ref 1.0–2.5)
ALT: 18 U/L (ref 9–46)
AST: 22 U/L (ref 10–35)
Albumin: 3.8 g/dL (ref 3.6–5.1)
Alkaline phosphatase (APISO): 93 U/L (ref 35–144)
Bilirubin, Direct: 0.1 mg/dL (ref 0.0–0.2)
Globulin: 3 g/dL (ref 1.9–3.7)
Indirect Bilirubin: 0.2 mg/dL (ref 0.2–1.2)
Total Bilirubin: 0.3 mg/dL (ref 0.2–1.2)
Total Protein: 6.8 g/dL (ref 6.1–8.1)

## 2023-08-23 ENCOUNTER — Encounter: Payer: Self-pay | Admitting: *Deleted

## 2023-08-29 HISTORY — PX: EYE SURGERY: SHX253

## 2023-09-12 NOTE — ED Provider Notes (Addendum)
 Emergency Department Provider Note    ED Clinical Impression   Final diagnoses:  Esophageal obstruction due to food impaction (Primary)    ED Assessment/Plan    History   Chief Complaint  Patient presents with  . Foreign Body in Throat   HPI  0115 hrs. Patient 63 year old gentleman history of substance abuse asthma emphysema cigarette smoking hypertension ate steak at 7 PM unable to swallow feels that the steak is stuck in his upper esophagus exam awake alert cooperative cardiopulmonary general neuroexam negative visualized oropharynx no visualized foreign body.  Clinical history [facility] classic for esophageal food impaction will plan medical management center for surgical consult if first initial measures for  Past Medical History:  Diagnosis Date  . Asthma   . Emphysema of lung (CMS-HCC)   . Glaucoma   . Hypertension   . Infectious viral hepatitis   . Mycobacterial disease   . Substance abuse (CMS-HCC)    Alcohol, Marijuana   . Tuberculosis     Past Surgical History:  Procedure Laterality Date  . EYE SURGERY Right   . INGUINAL HERNIA REPAIR Left    as a child     Family History  Problem Relation Age of Onset  . Cancer Mother   . Cancer Brother   . Hepatitis Brother   . Cancer Maternal Grandmother   . Cancer Maternal Grandfather   . Cancer Paternal Grandfather     Social History   Socioeconomic History  . Marital status: Divorced  Tobacco Use  . Smoking status: Every Day    Current packs/day: 2.00    Average packs/day: 2.0 packs/day for 45.0 years (90.0 ttl pk-yrs)    Types: Cigarettes  . Smokeless tobacco: Never  Substance and Sexual Activity  . Alcohol use: Yes    Alcohol/week: 50.0 standard drinks of alcohol    Types: 50 Cans of beer per week    Comment: 6 pack a day   . Drug use: Yes    Types: Marijuana  . Sexual activity: Not Currently    Review of Systems  HENT:  Positive for trouble swallowing.   All other systems reviewed and are  negative.   Physical Exam   BP 138/100   Pulse 90   Temp 36.9 C (98.4 F) (Oral)   Resp 20   Ht 180.3 cm (5' 11)   Wt 77.1 kg (170 lb)   SpO2 94%   BMI 23.71 kg/m   Physical Exam  Vital signs have been reviewed. Patient is well-appearing w/o respiratory distress shock or major trauma. HEENT is atraumatic.  Neck shows unimpaired range of motion. Chest No increased work of breathing or audible wheezing. Cardiac Good perfusion throughout. Abdomen is not distended. Extremities are without significant trauma. Skin no visualized rash. Neuro exam is grossly non-focal. Psych exam shows normal mood and behavior.  ED Course    For jail food impaction   Medical Decision Making   0200 hrs. Patient scheduled successful medical management of the cc now that he is able to swallow we will give him a trial of p.o. fluids he is able to keep down his secretions we will go ahead and discharge and instructions to follow-up with the surgeon for outpatient endoscopy explained to family the need to do this to rule out cancer or more serious illness   Chest x-ray shows abnormal findings apparently chronic with gold markers patient currently bacterial infection and has 7 clips are markers in his chest we will going refer him back  to primary care for follow-up of this issue   Dallara, John James, MD 09/12/23 [phone]    Dallara, John James, MD 09/12/23 [phone]    Dallara, John James, MD 09/12/23 [phone]

## 2023-10-03 NOTE — Progress Notes (Addendum)
 Derek [name], male    DOB: 02/09/1961    MRN: [id number]   Brief patient profile:  63  yowm  asthma as child  outgrew by 8th grade   but started smoking age 26 and by age 26 started back on inhalers referred to pulmonary clinic in [place]  10/04/2023 by Efrain (seeing for MAI)  for doe       History of Present Illness  10/04/2023  Pulmonary/ 1st office eval/ Daqwan Dougal / [facility]  / still smoking/ maint on symbicort   Chief Complaint  Patient presents with   Consult       Dyspnea:  walking thru grocery ok and parking lt ok  as long as paces himself = MMRC2 = can't walk a nl pace on a flat grade s sob but does fine slow and flat eg  Sleep: bed r side down one pillow wake up nightly and needs neb middle of night most nights  SABA use: neb excess use  02: none    No obvious day to day or daytime pattern/variability or assoc   mucus plugs or hemoptysis or cp or chest tightness, subjective wheeze or overt sinus or hb symptoms.    Also denies any obvious fluctuation of symptoms with weather or environmental changes or other aggravating or alleviating factors except as outlined above   No unusual exposure hx or h/o childhood pna  or knowledge of premature birth.  Current Allergies, Complete Past Medical History, Past Surgical History, Family History, and Social History were reviewed in [employer] record.  ROS  The following are not active complaints unless bolded Hoarseness, sore throat, dysphagia, dental problems, itching, sneezing,  nasal congestion or discharge of excess mucus or purulent secretions, ear ache,   fever, chills, sweats, unintended wt loss or wt gain, classically pleuritic or exertional cp,  orthopnea pnd or arm/hand swelling  or leg swelling, presyncope, palpitations, abdominal pain, anorexia, nausea, vomiting, diarrhea  or change in bowel habits or change in bladder habits, change in stools or change in urine, dysuria, hematuria,  rash,  arthralgias, visual complaints, headache, numbness, weakness or ataxia or problems with walking or coordination,  change in mood or  memory.            Outpatient Medications Prior to Visit - - NOTE:   Unable to verify as accurately reflecting what pt takes    Medication Sig Dispense Refill   albuterol  (PROVENTIL  HFA;VENTOLIN  HFA) 108 (90 Base) MCG/ACT inhaler Inhale 2 puffs into the lungs every 6 (six) hours as needed for wheezing or shortness of breath. 3 Inhaler 1   Cyanocobalamin (VITAMIN B-12 PO) Take 1 tablet by mouth daily.     ethambutol  (MYAMBUTOL ) 400 MG tablet Take 3 tablets (1,200 mg total) by mouth daily. 90 tablet 11   ipratropium-albuterol  (DUONEB) 0.5-2.5 (3) MG/3ML SOLN Inhale 3 mLs into the lungs 4 (four) times daily.  0   metoprolol succinate (TOPROL-XL) 50 MG 24 hr tablet Take 50 mg by mouth daily.     naltrexone (DEPADE) 50 MG tablet Take 50 mg by mouth daily.     ondansetron  (ZOFRAN ) 4 MG tablet Take 1 tablet (4 mg total) by mouth every 8 (eight) hours as needed for nausea or vomiting. 30 tablet 11   rifampin  (RIFADIN ) 300 MG capsule Take 2 capsules (600 mg total) by mouth daily. 60 capsule 11   spironolactone (ALDACTONE) 25 MG tablet Take 25 mg by mouth daily.     albuterol  (PROVENTIL ) (  2.5 MG/3ML) 0.083% nebulizer solution Take 3 mLs (2.5 mg total) by nebulization every 6 (six) hours as needed for wheezing or shortness of breath. 150 mL 1   lisinopril  (PRINIVIL ,ZESTRIL ) 10 MG tablet Take 1 tablet (10 mg total) by mouth daily. 90 tablet 1   SYMBICORT  160-4.5 MCG/ACT inhaler Inhale 1 puff into the lungs 2 (two) times daily.     azithromycin  (ZITHROMAX ) 500 MG tablet Take 1 tablet (500 mg total) by mouth daily. (Patient not taking: Reported on 10/04/2023) 30 tablet 11       Past Medical History:  Diagnosis Date   Arthritis    Asthma    COPD (chronic obstructive pulmonary disease) (HCC)    Emphysema lung (HCC)    Glaucoma    Hepatitis    C   Hypertension    Lung  nodules    Bilateral   Substance abuse (HCC)    Tuberculosis       Objective:     BP (!) 148/84   Pulse 97   Ht 5' 11 (1.803 m)   Wt 168 lb 3.2 oz (76.3 kg)   SpO2 90% Comment: room air  BMI 23.46 kg/m   SpO2: 90 % (room air)  Amb wm harsh dry sounding cough    HEENT : Oropharynx  clear       NECK :  without  apparent JVD/ palpable Nodes/TM    LUNGS: no acc muscle use,  Min barrel  contour chest wall with bilateral exp rhonchi    and  without cough on insp or exp maneuvers and min  Hyperresonant  to  percussion bilaterally    CV:  RRR  no s3 or murmur or increase in P2, and no edema   ABD:  soft and nontender with pos end  insp Hoover's  in the supine position.  No bruits or organomegaly appreciated   MS:  Nl gait/ ext warm without deformities Or obvious joint restrictions  calf tenderness, cyanosis or clubbing     SKIN: warm and dry without lesions    NEURO:  alert, approp, nl sensorium with  no motor or cerebellar deficits apparent.         I personally reviewed images and agree with radiology impression as follows:  CXR:   pa and lateral 02/17/19  Apparent treated cavitary lesions in the left upper lobe and right perihilar regions. Cavitation is better appreciated in the left upper lobe lesion. No new opacities are evident. No adenopathy evident. Stable cardiac silhouette. Old rib trauma noted on the left with remodeling.    Assessment   Asthmatic bronchitis , chronic (HCC) Active smoker with MAI  - 10/04/2023  After extensive coaching inhaler device,  effectiveness =    75% with smi and hfa > add spiriva  to symbicort  but reduce to 80 due to chronic infection  DDX of  difficult airways management almost all start with A and  include Adherence, Ace Inhibitors, Acid Reflux, Active Sinus Disease, Alpha 1 Antitripsin deficiency, Anxiety masquerading as Airways dz,  ABPA,  Allergy(esp in young), Aspiration (esp in elderly), Adverse effects of meds,  Active  smoking or vaping, A bunch of PE's (a small clot burden can't cause this syndrome unless there is already severe underlying pulm or vascular dz with poor reserve) plus two Bs  = Bronchiectasis and Beta blocker use..and one C= CHF  Adherence is always the initial prime suspect and is a multilayered concern that requires a trust but verify approach in every  patient - starting with knowing how to use medications, especially inhalers, correctly, keeping up with refills and understanding the fundamental difference between maintenance and prns vs those medications only taken for a very short course and then stopped and not refilled.  -see hfa teaching above - return with all meds in hand using a trust but verify approach to confirm accurate Medication  Reconciliation The principal here is that until we are certain that the  patients are doing what we've asked, it makes no sense to ask them to do more.   Active smoking top of the rest of the list - see sep a/p  ? ACEi  > says he stopped them but doesn't know what he's on now   ? Alpha one at def > needs phenotype next ov   ? BB > if using BB In the setting of refractory respiratory symptoms,,  It would be preferable to use bystolic, the most beta -1  selective Beta blocker available in sample form, with bisoprolol the most selective generic choice  on the market, at least on a trial basis, to make sure the spillover Beta 2 effects of the less specific Beta blockers are not contributing to this patient's symptoms.   Bronchiectasis > flutter valve training today         Each maintenance medication was reviewed in detail including emphasizing most importantly the difference between maintenance and prns and under what circumstances the prns are to be triggered using an action plan format where appropriate.  Total time for H and P, chart review, counseling, reviewing hfa/ smi/ neb/ flutter device(s) and generating customized AVS unique to this office  visit / same day charting = 60 min new pt eval            Cigarette smoker 4-5 min discussion re active cigarette smoking in addition to office E&M  Ask about tobacco use:   ongoing  Advise quitting   I took an extended  opportunity with this patient to outline the consequences of continued cigarette use  in airway disorders based on all the data we have from the multiple national lung health studies (perfomed over decades at millions of dollars in cost)  indicating that smoking cessation, not choice of inhalers or pulmonary physicians, is the most important aspect of his  care.   Assess willingness:  Not committed at this point Assist in quit attempt:  Per PCP when ready Arrange follow up:   Follow up per Primary Care planned           Ozell America, MD 10/04/2023

## 2023-10-04 ENCOUNTER — Ambulatory Visit: Payer: Medicaid Other | Admitting: Internal Medicine

## 2023-10-04 ENCOUNTER — Encounter: Payer: Self-pay | Admitting: Internal Medicine

## 2023-10-04 VITALS — BP 148/84 | HR 97 | Ht 71.0 in | Wt 168.2 lb

## 2023-10-04 DIAGNOSIS — J449 Chronic obstructive pulmonary disease, unspecified: Secondary | ICD-10-CM | POA: Insufficient documentation

## 2023-10-04 DIAGNOSIS — J4489 Other specified chronic obstructive pulmonary disease: Secondary | ICD-10-CM

## 2023-10-04 DIAGNOSIS — F17210 Nicotine dependence, cigarettes, uncomplicated: Secondary | ICD-10-CM

## 2023-10-04 MED ORDER — BUDESONIDE-FORMOTEROL FUMARATE 80-4.5 MCG/ACT IN AERO: INHALATION_SPRAY | RESPIRATORY_TRACT | 11 refills | Status: DC

## 2023-10-04 MED ORDER — SPIRIVA RESPIMAT 2.5 MCG/ACT IN AERS: 2.0000 | INHALATION_SPRAY | Freq: Every day | RESPIRATORY_TRACT | Status: DC

## 2023-10-04 MED ORDER — ALBUTEROL SULFATE (2.5 MG/3ML) 0.083% IN NEBU: 2.5000 mg | INHALATION_SOLUTION | RESPIRATORY_TRACT | 11 refills | Status: AC | PRN

## 2023-10-04 MED ORDER — SPIRIVA RESPIMAT 2.5 MCG/ACT IN AERS: INHALATION_SPRAY | RESPIRATORY_TRACT | 11 refills | Status: DC

## 2023-10-04 NOTE — Assessment & Plan Note (Addendum)
 Active smoker with MAI  - 10/04/2023  After extensive coaching inhaler device,  effectiveness =    75% with smi and hfa > add spiriva  to symbicort  but reduce to 80 due to chronic infection  DDX of  difficult airways management almost all start with A and  include Adherence, Ace Inhibitors, Acid Reflux, Active Sinus Disease, Alpha 1 Antitripsin deficiency, Anxiety masquerading as Airways dz,  ABPA,  Allergy(esp in young), Aspiration (esp in elderly), Adverse effects of meds,  Active smoking or vaping, A bunch of PE's (a small clot burden can't cause this syndrome unless there is already severe underlying pulm or vascular dz with poor reserve) plus two Bs  = Bronchiectasis and Beta blocker use..and one C= CHF  Adherence is always the initial prime suspect and is a multilayered concern that requires a trust but verify approach in every patient - starting with knowing how to use medications, especially inhalers, correctly, keeping up with refills and understanding the fundamental difference between maintenance and prns vs those medications only taken for a very short course and then stopped and not refilled.  -see hfa teaching above - return with all meds in hand using a trust but verify approach to confirm accurate Medication  Reconciliation The principal here is that until we are certain that the  patients are doing what we've asked, it makes no sense to ask them to do more.   Active smoking top of the rest of the list - see sep a/p  ? ACEi  > says he stopped them but doesn't know what he's on now   ? Alpha one at def > needs phenotype next ov   ? BB > if using BB In the setting of refractory respiratory symptoms,,  It would be preferable to use bystolic, the most beta -1  selective Beta blocker available in sample form, with bisoprolol the most selective generic choice  on the market, at least on a trial basis, to make sure the spillover Beta 2 effects of the less specific Beta blockers are not  contributing to this patient's symptoms.   Bronchiectasis > flutter valve training today         Each maintenance medication was reviewed in detail including emphasizing most importantly the difference between maintenance and prns and under what circumstances the prns are to be triggered using an action plan format where appropriate.  Total time for H and P, chart review, counseling, reviewing hfa/ smi/ neb/ flutter device(s) and generating customized AVS unique to this office visit / same day charting = 60 min new pt eval

## 2023-10-04 NOTE — Assessment & Plan Note (Signed)

## 2023-10-04 NOTE — Patient Instructions (Addendum)
 Plan A = Automatic = Always=    Symbicort  80 Take 2 puffs first thing in am and then another 2 puffs about 12 hours later and after the morning symbicort  take spiriva  x 2 puffs   Work on inhaler technique:  relax and gently blow all the way out then take a nice smooth full deep breath back in, triggering the inhaler at same time you start breathing in.  Hold breath in for at least  5 seconds if you can. Blow out breztri  thru nose. Rinse and gargle with water  when done.  If mouth or throat bother you at all,  try brushing teeth/gums/tongue with arm and hammer toothpaste/ make a slurry and gargle and spit out.    Plan B = Backup (to supplement plan A, not to replace it) Only use your albuterol  inhaler as a rescue medication to be used if you can't catch your breath by resting or doing a relaxed purse lip breathing pattern.  - The less you use it, the better it will work when you need it. - Ok to use the inhaler up to 2 puffs  every 4 hours if you must but call for appointment if use goes up over your usual need - Don't leave home without it !!  (think of it like the spare tire for your car)   Plan C = Crisis (instead of Plan B but only if Plan B stops working) - only use your albuterol  nebulizer if you first try Plan B and it fails to help > ok to use the nebulizer up to every 4 hours but if start needing it regularly call for immediate appointment  Flutter valve as much as possible   Please schedule a follow up office visit in 6 weeks, call sooner if needed with all medications /inhalers/ solutions in hand so we can verify exactly what you are taking. This includes all medications from all doctors and over the counters

## 2023-11-18 NOTE — Progress Notes (Unsigned)
 Derek [name], male    DOB: 1961-04-11    MRN: [id number]   Brief patient profile:  26  yowm  active smoker asthma as child  outgrew by 8th grade   but started smoking age 63 and by age 10 started back on inhalers referred to pulmonary clinic in [place]  10/04/2023 by Luciana Axe (seeing for MAI)  for doe     History of Present Illness  10/04/2023  Pulmonary/ 1st office eval/ Kaspian Muccio / [facility]  / still smoking/ maint on Educational psychologist Complaint  Patient presents with   Consult       Dyspnea:  walking thru grocery ok and parking lt ok  as long as paces himself = MMRC2 = can't walk a nl pace on a flat grade s sob but does fine slow and flat   Sleep: bed r side down one pillow wake up nightly and needs neb middle of night most nights  SABA use: neb excess use  02: none  Rec Plan A = Automatic = Always=    Symbicort 80 Take 2 puffs first thing in am and then another 2 puffs about 12 hours later and after the morning symbicort take spiriva x 2 puffs  Work on inhaler technique:   Plan B = Backup (to supplement plan A, not to replace it) Only use your albuterol inhaler as a rescue medication  Plan C = Crisis (instead of Plan B but only if Plan B stops working) - only use your albuterol nebulizer if you first try Plan B Flutter valve as much as possible   Please schedule a follow up office visit in 6 weeks, call sooner if needed with all medications /inhalers/ solutions in hand    .11/22/2023  f/u ov/[place] [facility]/Glorianne Proctor re: AB maint on symbicort/ spirviva did  bring meds / taking spiriva in pm / stopped rx for MAI in Feb 2025  Chief Complaint  Patient presents with   Follow-up    6 week follow up  , pt is still smoking , has dry cough , not taking for cough   Dyspnea:  has to walk slow / flat = MMRC2 = can't walk a nl pace on a flat grade s sob but does fine slow and flat  Cough: clear  Sleeping: flat bed /  SABA use: neb once at night / hfa avg 3x in 24 h 02: none        No obvious day to day or daytime variability or assoc excess/ purulent sputum or mucus plugs or hemoptysis or cp or chest tightness, subjective wheeze or overt sinus or hb symptoms.    Also denies any obvious fluctuation of symptoms with weather or environmental changes or other aggravating or alleviating factors except as outlined above   No unusual exposure hx or h/o childhood pna/ asthma or knowledge of premature birth.  Current Allergies, Complete Past Medical History, Past Surgical History, Family History, and Social History were reviewed in [employer] record.  ROS  The following are not active complaints unless bolded Hoarseness, sore throat, dysphagia, dental problems, itching, sneezing,  nasal congestion or discharge of excess mucus or purulent secretions, ear ache,   fever, chills, sweats, unintended wt loss or wt gain, classically pleuritic or exertional cp,  orthopnea pnd or arm/hand swelling  or leg swelling, presyncope, palpitations, abdominal pain, anorexia, nausea, vomiting, diarrhea  or change in bowel habits or change in bladder habits, change in stools or change in urine,  dysuria, hematuria,  rash, arthralgias, visual complaints, headache, numbness, weakness or ataxia or problems with walking or coordination,  change in mood or  memory.        Current Meds  Medication Sig   albuterol (PROVENTIL HFA;VENTOLIN HFA) 108 (90 Base) MCG/ACT inhaler Inhale 2 puffs into the lungs every 6 (six) hours as needed for wheezing or shortness of breath.   albuterol (PROVENTIL) (2.5 MG/3ML) 0.083% nebulizer solution Take 3 mLs (2.5 mg total) by nebulization every 4 (four) hours as needed for wheezing or shortness of breath.   budesonide-formoterol (SYMBICORT) 80-4.5 MCG/ACT inhaler Take 2 puffs first thing in am and then another 2 puffs about 12 hours later.   Cyanocobalamin (VITAMIN B-12 PO) Take 1 tablet by mouth daily.   ipratropium-albuterol (DUONEB) 0.5-2.5 (3)  MG/3ML SOLN Inhale 3 mLs into the lungs 4 (four) times daily.   metoprolol succinate (TOPROL-XL) 50 MG 24 hr tablet Take 50 mg by mouth daily.   Tiotropium Bromide Monohydrate (SPIRIVA RESPIMAT) 2.5 MCG/ACT AERS 2 puffs each am             Past Medical History:  Diagnosis Date   Arthritis    Asthma    COPD (chronic obstructive pulmonary disease) (HCC)    Emphysema lung (HCC)    Glaucoma    Hepatitis    C   Hypertension    Lung nodules    Bilateral   Substance abuse (HCC)    Tuberculosis       Objective:    Wt Readings from Last 3 Encounters:  11/22/23 169 lb (76.7 kg)  10/04/23 168 lb 3.2 oz (76.3 kg)  08/17/23 168 lb (76.2 kg)      Vital signs reviewed  11/22/2023  - Note at rest 02 sats  91% on RA   General appearance:   hoarse  amb wm nad      HEENT : Oropharynx  clear      NECK :  without  apparent JVD/ palpable Nodes/TM    LUNGS: no acc muscle use,  Min barrel  contour chest wall with bilateral   minimal insp /exp rhonchi  and  without cough on insp or exp maneuvers and min  Hyperresonant  to  percussion bilaterally    CV:  RRR  no s3 or murmur or increase in P2, and no edema   ABD:  soft and nontender     MS:  Nl gait/ ext warm without deformities Or obvious joint restrictions  calf tenderness, cyanosis or clubbing    SKIN: warm and dry without lesions    NEURO:  alert, approp, nl sensorium with  no motor or cerebellar deficits apparent.                Assessment

## 2023-11-22 ENCOUNTER — Ambulatory Visit: Payer: Medicaid Other | Admitting: Internal Medicine

## 2023-11-22 ENCOUNTER — Encounter: Payer: Self-pay | Admitting: Internal Medicine

## 2023-11-22 VITALS — BP 144/87 | HR 87 | Ht 71.0 in | Wt 169.0 lb

## 2023-11-22 DIAGNOSIS — J4489 Other specified chronic obstructive pulmonary disease: Secondary | ICD-10-CM | POA: Diagnosis not present

## 2023-11-22 DIAGNOSIS — A310 Pulmonary mycobacterial infection: Secondary | ICD-10-CM | POA: Diagnosis not present

## 2023-11-22 DIAGNOSIS — F17210 Nicotine dependence, cigarettes, uncomplicated: Secondary | ICD-10-CM | POA: Diagnosis not present

## 2023-11-22 DIAGNOSIS — R490 Dysphonia: Secondary | ICD-10-CM | POA: Diagnosis not present

## 2023-11-22 NOTE — Assessment & Plan Note (Addendum)
 Active smoker with MAI  - 10/04/2023   add spiriva to symbicort but reduce to 80 due to chronic infection  - 11/22/2023  After extensive coaching inhaler device,  effectiveness =  70%   (short Ti, double triggering hfa) > continue symb 80 x 2 in am/ spiriva x 2 in am and symbicort 80 x 2 in pm   He has evidence of CB from smoking but nothing purulent to suggest MAI recurrence, likely just from smoking (see sep a/p) so no change rx   PFTs are pending   Re SABA :  I spent extra time with pt today reviewing appropriate use of albuterol for prn use on exertion with the following points: 1) saba is for relief of sob that does not improve by walking a slower pace or resting but rather if the pt does not improve after trying this first. 2) If the pt is convinced, as many are, that saba helps recover from activity faster then it's easy to tell if this is the case by re-challenging : ie stop, take the inhaler, then p 5 minutes try the exact same activity (intensity of workload) that just caused the symptoms and see if they are substantially diminished or not after saba 3) if there is an activity that reproducibly causes the symptoms, try the saba 15 min before the activity on alternate days   If in fact the saba really does help, then fine to continue to use it prn but advised may need to look closer at the maintenance regimen being used to achieve better control of airways disease with exertion.

## 2023-11-22 NOTE — Assessment & Plan Note (Signed)
 Referred to ENT 11/22/2023          Each maintenance medication was reviewed in detail including emphasizing most importantly the difference between maintenance and prns and under what circumstances the prns are to be triggered using an action plan format where appropriate.  Total time for H and P, chart review, counseling, reviewing hfa/smi/neb device(s) and generating customized AVS unique to this office visit / same day charting = 34 min

## 2023-11-22 NOTE — Patient Instructions (Signed)
 My office will be contacting you by phone for referral for PFT  - if you don't hear back from my office within one week please call us back or notify us thru MyChart and we'll address it right away.   Work on inhaler technique:  relax and gently blow all the way out then take a nice smooth full deep breath back in, triggering the inhaler at same time you start breathing in.  Hold breath in for at least  5 seconds if you can. Blow out symbicort thru nose. Rinse and gargle with water when done.  If mouth or throat bother you at all,  try brushing teeth/gums/tongue with arm and hammer toothpaste/ make a slurry and gargle and spit out.   Try to reduce your use of albuterol if you don't really need it to catch your breath  My office will be contacting you by phone for referral to ENT for hoarseness  - if you don't hear back from my office within one week please call us back or notify us thru MyChart and we'll address it right away.    Please schedule a follow up visit in 3 months but call sooner if needed

## 2023-11-22 NOTE — Assessment & Plan Note (Signed)
 Stopped rx in Feb 2025  - f/u cxr 11/22/2023   Advised on symptoms of recurrent MAI  and how to tell the difference between this and simple smoker's bronchitis.

## 2023-11-22 NOTE — Assessment & Plan Note (Signed)

## 2023-11-23 ENCOUNTER — Telehealth: Payer: Self-pay | Admitting: Internal Medicine

## 2023-11-23 NOTE — Telephone Encounter (Signed)
 Patient is aware of the Thursday 12/27/23 11:30 am PFT appointment at [facility] tine 11:15 am --1st floor registration desk ---will mail information to patient

## 2023-12-27 ENCOUNTER — Ambulatory Visit (HOSPITAL_COMMUNITY)
Admission: RE | Admit: 2023-12-27 | Discharge: 2023-12-27 | Disposition: A | Discharge status: Home / Self Care | Source: Ambulatory Visit | Attending: Internal Medicine | Admitting: Internal Medicine

## 2023-12-27 DIAGNOSIS — J4489 Other specified chronic obstructive pulmonary disease: Secondary | ICD-10-CM | POA: Insufficient documentation

## 2023-12-27 LAB — PULMONARY FUNCTION TEST
DL/VA % pred: 97 %
DL/VA: 4.01 ml/min/mmHg/L
DLCO unc % pred: 71 %
DLCO unc: 19.79 ml/min/mmHg
FEF 25-75 Post: 0.39 L/s
FEF 25-75 Pre: 0.36 L/s
FEF2575-%Change-Post: 8 %
FEF2575-%Pred-Post: 13 %
FEF2575-%Pred-Pre: 12 %
FEV1-%Change-Post: 1 %
FEV1-%Pred-Post: 28 %
FEV1-%Pred-Pre: 27 %
FEV1-Post: 1.03 L
FEV1-Pre: 1.01 L
FEV1FVC-%Change-Post: -5 %
FEV1FVC-%Pred-Pre: 49 %
FEV6-%Change-Post: 3 %
FEV6-%Pred-Post: 52 %
FEV6-%Pred-Pre: 50 %
FEV6-Post: 2.41 L
FEV6-Pre: 2.32 L
FEV6FVC-%Change-Post: -3 %
FEV6FVC-%Pred-Post: 87 %
FEV6FVC-%Pred-Pre: 90 %
FVC-%Change-Post: 7 %
FVC-%Pred-Post: 59 %
FVC-%Pred-Pre: 55 %
FVC-Post: 2.9 L
FVC-Pre: 2.7 L
Post FEV1/FVC ratio: 35 %
Post FEV6/FVC ratio: 83 %
Pre FEV1/FVC ratio: 37 %
Pre FEV6/FVC Ratio: 86 %
RV % pred: 267 %
RV: 6.32 L
TLC % pred: 131 %
TLC: 9.46 L

## 2023-12-27 MED ORDER — ALBUTEROL SULFATE (2.5 MG/3ML) 0.083% IN NEBU
2.5000 mg | INHALATION_SOLUTION | Freq: Once | RESPIRATORY_TRACT | Status: AC
  Administered 2023-12-27: 2.5 mg via RESPIRATORY_TRACT

## 2023-12-29 ENCOUNTER — Encounter: Payer: Self-pay | Admitting: Internal Medicine

## 2024-02-07 ENCOUNTER — Other Ambulatory Visit (HOSPITAL_COMMUNITY): Payer: Self-pay | Admitting: *Deleted

## 2024-02-07 ENCOUNTER — Encounter (INDEPENDENT_AMBULATORY_CARE_PROVIDER_SITE_OTHER): Payer: Self-pay | Admitting: Otolaryngology

## 2024-02-07 ENCOUNTER — Ambulatory Visit (INDEPENDENT_AMBULATORY_CARE_PROVIDER_SITE_OTHER): Admitting: Otolaryngology

## 2024-02-07 DIAGNOSIS — R490 Dysphonia: Secondary | ICD-10-CM | POA: Diagnosis not present

## 2024-02-07 DIAGNOSIS — Z720 Tobacco use: Secondary | ICD-10-CM

## 2024-02-07 DIAGNOSIS — J449 Chronic obstructive pulmonary disease, unspecified: Secondary | ICD-10-CM | POA: Diagnosis not present

## 2024-02-07 DIAGNOSIS — H9193 Unspecified hearing loss, bilateral: Secondary | ICD-10-CM | POA: Diagnosis not present

## 2024-02-07 DIAGNOSIS — R1310 Dysphagia, unspecified: Secondary | ICD-10-CM

## 2024-02-07 DIAGNOSIS — H9313 Tinnitus, bilateral: Secondary | ICD-10-CM

## 2024-02-07 DIAGNOSIS — R042 Hemoptysis: Secondary | ICD-10-CM | POA: Diagnosis not present

## 2024-02-07 NOTE — Progress Notes (Signed)
 ENT CONSULT:  Reason for Consult: hoarseness  and hx of smoking  HPI: Discussed the use of AI scribe software for clinical note transcription with the patient, who gave verbal consent to proceed.  History of Present Illness  Discussed the use of AI scribe software for clinical note transcription with the patient, who gave verbal consent to proceed.  History of Present Illness   Derek [name] is a 63 year old male with COPD who presents with hearing loss and voice changes.  He experiences hearing difficulties, which he associates with a previous COVID-19 infection. His hearing temporarily improves when he 'pops' his ears, similar to the sensation experienced at high altitudes. He also experiences intermittent tinnitus. He is hard of hearing and has to ask others to repeat what they say and raise their voice.   He describes voice changes characterized by intermittent raspiness, which he attributes to his long history of smoking. He has been smoking for fifty years, currently at a rate of two packs per day. He has attempted to quit smoking in the past using Chantix without success.  He has a history of hemoptysis, which occurred approximately six months ago for about three days after he strained his throat. He also experiences occasional odynophagia and has had episodes of choking on food. He recalls a specific incident where a piece of steak and bread became lodged in his throat, leading to an emergency room visit where he was treated with medication typically used for hypoglycemia, which resolved the issue within forty-five minutes.  He consumes twelve to fifteen beers daily. He uses Symbicort  and albuterol  inhalers for his COPD. No lumps in his neck and no significant changes in his neck area. He experiences frequent phlegm production and nasal congestion.     Records Reviewed:  ED note 09/12/23 Patient 63 year old gentleman history of substance abuse asthma emphysema cigarette smoking  hypertension ate steak at 7 PM unable to swallow feels that the steak is stuck in his upper esophagus exam awake alert cooperative cardiopulmonary general neuroexam negative visualized oropharynx no visualized foreign body. Clinical history [place] classic for esophageal food impaction will plan medical management center for surgical consult if first initial measures for   Diagnosis Date  Asthma  Emphysema of lung (CMS-HCC)  Glaucoma  Hypertension  Infectious viral hepatitis  Mycobacterial disease  Substance abuse (CMS-HCC)  Alcohol, Marijuana  Tuberculosis   Gave glucagon and it passed   Past Medical History:  Diagnosis Date   Arthritis    Asthma    COPD (chronic obstructive pulmonary disease) (HCC)    Emphysema lung (HCC)    Glaucoma    Hepatitis    C   Hypertension    Lung nodules    Bilateral   Substance abuse (HCC)    Tuberculosis     Past Surgical History:  Procedure Laterality Date   BIOPSY  11/09/2015   Procedure: BIOPSY;  Surgeon: Alyce Jubilee, MD;  Location: [facility];  Service: Endoscopy;;  sigmoid colon polyps x3   COLONOSCOPY WITH PROPOFOL  N/A 11/09/2015   SLF:5 POLYPSREMOVED FROM RECTO-SIGMOID COLON/INTERNAL HEMORRHOIDS   ESOPHAGOGASTRODUODENOSCOPY (EGD) WITH PROPOFOL  N/A 11/09/2015   [id number]   EYE SURGERY Right    FUDUCIAL PLACEMENT N/A 11/09/2017   Procedure: PLACEMENT OF FUDUCIALS IN LEFT UPPER LOBE AND RIGHT LOWER LOBE LUNG;  Surgeon: Zelphia Higashi, MD;  Location: [facility] OR;  Service: Thoracic;  Laterality: N/A;   HERNIA REPAIR Left    POLYPECTOMY  11/09/2015   Procedure:  POLYPECTOMY;  Surgeon: Alyce Jubilee, MD;  Location: [facility];  Service: Endoscopy;;  rectal polyps x3( one not retrieved   VIDEO BRONCHOSCOPY WITH ENDOBRONCHIAL NAVIGATION N/A 11/09/2017   Procedure: VIDEO BRONCHOSCOPY WITH ENDOBRONCHIAL NAVIGATION;  Surgeon: Zelphia Higashi, MD;  Location: [facility] OR;  Service: Thoracic;  Laterality: N/A;    Family History  Problem  Relation Age of Onset   Cancer Mother        lymph nodes   Ulcers Father    Cholecystitis Father    Colon cancer Neg Hx    Colon polyps Neg Hx     Social History:  reports that he has been smoking cigarettes. He has a 40 pack-year smoking history. He has never used smokeless tobacco. He reports current alcohol use. He reports current drug use. Drugs: Marijuana and Cocaine.  Allergies: No Known Allergies  Medications: I have reviewed the patient's current medications.  The PMH, PSH, Medications, Allergies, and SH were reviewed and updated.  ROS: Constitutional: Negative for fever, weight loss and weight gain. Cardiovascular: Negative for chest pain and dyspnea on exertion. Respiratory: Is not experiencing shortness of breath at rest. Gastrointestinal: Negative for nausea and vomiting. Neurological: Negative for headaches. Psychiatric: The patient is not nervous/anxious  There were no vitals taken for this visit. There is no height or weight on file to calculate BMI.  PHYSICAL EXAM:  Exam: General: Well-developed, well-nourished Communication and Voice: raspy Respiratory Respiratory effort: Equal inspiration and expiration without stridor Cardiovascular Peripheral Vascular: Warm extremities with equal color/perfusion Eyes: No nystagmus with equal extraocular motion bilaterally Neuro/Psych/Balance: Patient oriented to person, place, and time; Appropriate mood and affect; Gait is intact with no imbalance; Cranial nerves I-XII are intact Head and Face Inspection: Normocephalic and atraumatic without mass or lesion Palpation: Facial skeleton intact without bony stepoffs Salivary Glands: No mass or tenderness Facial Strength: Facial motility symmetric and full bilaterally ENT Pinna: External ear intact and fully developed External canal: Canal is patent with intact skin Tympanic Membrane: Clear and mobile External Nose: No scar or anatomic deformity Internal Nose: Septum is  relatively straight. No polyp, or purulence. Mucosal edema and erythema present.  Bilateral inferior turbinate hypertrophy.  Lips, Teeth, and gums: Mucosa and teeth intact and viable TMJ: No pain to palpation with full mobility Oral cavity/oropharynx: No erythema or exudate, no lesions present Nasopharynx: No mass or lesion with intact mucosa Hypopharynx: Intact mucosa without pooling of secretions Larynx Glottic: Full true vocal cord mobility without lesion or mass Vf atrophy present Supraglottic: Normal appearing epiglottis and AE folds Interarytenoid Space: Moderate pachydermia&edema Subglottic Space: Patent without lesion or edema Neck Neck and Trachea: Midline trachea without mass or lesion Thyroid: No mass or nodularity Lymphatics: No lymphadenopathy  Procedure: Preoperative diagnosis: hoarseness   Postoperative diagnosis:   Same  Procedure: Flexible fiberoptic laryngoscopy  Surgeon: Artice Last, MD  Anesthesia: Topical lidocaine  and Afrin Complications: None Condition is stable throughout exam  Indications and consent:  The patient presents to the clinic with above symptoms. Indirect laryngoscopy view was incomplete. Thus it was recommended that they undergo a flexible fiberoptic laryngoscopy. All of the risks, benefits, and potential complications were reviewed with the patient preoperatively and verbal informed consent was obtained.  Procedure: The patient was seated upright in the clinic. Topical lidocaine  and Afrin were applied to the nasal cavity. After adequate anesthesia had occurred, I then proceeded to pass the flexible telescope into the nasal cavity. The nasal cavity was patent without rhinorrhea or polyp.  The nasopharynx was also patent without mass or lesion. The base of tongue was visualized and was normal. There were no signs of pooling of secretions in the piriform sinuses. The true vocal folds were mobile bilaterally. There were no signs of glottic or  supraglottic mucosal lesion or mass. There was moderate interarytenoid pachydermia and post cricoid edema. The telescope was then slowly withdrawn and the patient tolerated the procedure throughout.      Studies Reviewed: CXR 12/27/23 Narrative & Impression  CLINICAL DATA:  Chronic cough.  History of COPD.   EXAM: CHEST - 2 VIEW   COMPARISON:  09/12/2023 and older exams.   FINDINGS: Cardiac silhouette is normal in size and configuration. No mediastinal or hilar masses. No evidence of adenopathy. Superior left hilar retraction is stable.   Left upper lobe opacity consistent with a cavitary lesion, 4 cm in long axis, associated with 3 vascular clips, is stable. Additional linear type opacities extend laterally and superiorly from this, also unchanged, consistent with scarring. Remainder of the left lung is clear.   Right perihilar surgical vascular clips associated with right lateral hilar opacity, also unchanged consistent with scarring.   Lungs are hyperexpanded but otherwise clear.   No pleural effusion or pneumothorax.   Skeletal structures are intact.   IMPRESSION: 1. No acute cardiopulmonary disease. 2. Treatment related changes in both lungs. Apparent cavitary lesion in the left upper lobe with associated scarring is stable.      Assessment/Plan: Encounter Diagnoses  Name Primary?   Hoarseness Yes   Decreased hearing of both ears    Tinnitus of both ears    Tobacco abuse    Hemoptysis    Dysphagia, unspecified type     Assessment and Plan Assessment & Plan  Dysphonia hx of smoking  Flexible scope exam without masses or lesions, but he did have minimal layering of secretions along vallecula right pharynx and hypopharynx  - will evaluate dysphagia and order CT neck 2/2 report of episode of hemoptysis and hx of smoking, although his scope did not show any lesions - this is primarily to evaluate hypopharynx as it is not well seen on scope exam    Hemoptysis Reported hemoptysis six months ago. No source identified. Concern for underlying pathology given smoking history. Further imaging warranted. - Order neck CT w/con.     Eustachian tube dysfunction Likely secondary to nasal congestion, possibly exacerbated by smoking. No infection or acute issues noted.  - trial of Flonase   Hearing loss Difficulty hearing improves with ear popping. Occasional tinnitus. He is also hard of hearing on exam today. Normal ear exam.  - Order audiogram  Dysphagia Intermittent dysphagia with previous food impaction requiring visit to ED. Occasional discomfort and choking. Secretions on right side along hypopharynx suggest swallowing difficulty. Further evaluation warranted. - Order barium swallow study. MBS + esophagram   Chronic Obstructive Pulmonary Disease (COPD) COPD with extensive smoking history. Uses Symbicort  and albuterol .  - continue to see Pulm and continue inhalers           CT neck w/con, Audio, MBS esoph and RTC 2 mo   Thank you for allowing me to participate in the care of this patient. Please do not hesitate to contact me with any questions or concerns.   Artice Last, MD Otolaryngology [facility] Phone: [phone] Fax: [phone]    02/07/2024, 1:21 PM

## 2024-02-20 ENCOUNTER — Other Ambulatory Visit (HOSPITAL_COMMUNITY)

## 2024-02-20 ENCOUNTER — Encounter (HOSPITAL_COMMUNITY)

## 2024-02-24 NOTE — Progress Notes (Deleted)
 Derek [name], male    DOB: 06/20/1961    MRN: [id number]   Brief patient profile:  63  yowm  active smoker asthma as child  outgrew by 8th grade   but started smoking age 63 and by age 16 started back on inhalers referred to pulmonary clinic in [place]  10/04/2023 by Derek [name] (seeing for MAI)  for doe     History of Present Illness  10/04/2023  Pulmonary/ 1st office eval/ Derek [name] / Derek [name] Office  / still smoking/ maint on symbicort   Chief Complaint  Patient presents with   Consult       Dyspnea:  walking thru grocery ok and parking lt ok  as long as paces himself = MMRC2 = can't walk a nl pace on a flat grade s sob but does fine slow and flat   Sleep: bed r side down one pillow wake up nightly and needs neb middle of night most nights  SABA use: neb excess use  02: none  Rec Plan A = Automatic = Always=    Symbicort  80 Take 2 puffs first thing in am and then another 2 puffs about 12 hours later and after the morning symbicort  take spiriva  x 2 puffs  Work on inhaler technique:   Plan B = Backup (to supplement plan A, not to replace it) Only use your albuterol  inhaler as a rescue medication  Plan C = Crisis (instead of Plan B but only if Plan B stops working) - only use your albuterol  nebulizer if you first try Plan B Flutter valve as much as possible   Please schedule a follow up office visit in 6 weeks, call sooner if needed with all medications /inhalers/ solutions in hand    .11/22/2023  f/u ov/Derek [name] office/Derek [name] re: AB maint on symbicort / spirviva did  bring meds / taking spiriva  in pm / stopped rx for MAI in Feb 2025  Chief Complaint  Patient presents with   Follow-up    6 week follow up  , pt is still smoking , has dry cough , not taking for cough   Dyspnea:  has to walk slow / flat = MMRC2 = can't walk a nl pace on a flat grade s sob but does fine slow and flat  Cough: clear  Sleeping: flat bed /  SABA use: neb once at night / hfa avg 3x in 24 h 02: none   Rec     02/26/2024  f/u ov/Derek [name] office/Derek [name] re: *** maint on ***  No chief complaint on file.   Dyspnea:  *** Cough: *** Sleeping: ***   resp cc  SABA use: *** 02: ***  Lung cancer screening: ***   No obvious day to day or daytime variability or assoc excess/ purulent sputum or mucus plugs or hemoptysis or cp or chest tightness, subjective wheeze or overt sinus or hb symptoms.    Also denies any obvious fluctuation of symptoms with weather or environmental changes or other aggravating or alleviating factors except as outlined above   No unusual exposure hx or h/o childhood pna/ asthma or knowledge of premature birth.  Current Allergies, Complete Past Medical History, Past Surgical History, Family History, and Social History were reviewed in [employer] record.  ROS  The following are not active complaints unless bolded Hoarseness, sore throat, dysphagia, dental problems, itching, sneezing,  nasal congestion or discharge of excess mucus or purulent secretions, ear ache,   fever, chills, sweats, unintended wt loss or wt  gain, classically pleuritic or exertional cp,  orthopnea pnd or arm/hand swelling  or leg swelling, presyncope, palpitations, abdominal pain, anorexia, nausea, vomiting, diarrhea  or change in bowel habits or change in bladder habits, change in stools or change in urine, dysuria, hematuria,  rash, arthralgias, visual complaints, headache, numbness, weakness or ataxia or problems with walking or coordination,  change in mood or  memory.        No outpatient medications have been marked as taking for the 02/26/24 encounter (Appointment) with Derek Ozell NOVAK, MD.             Past Medical History:  Diagnosis Date   Arthritis    Asthma    COPD (chronic obstructive pulmonary disease) (HCC)    Emphysema lung (HCC)    Glaucoma    Hepatitis    C   Hypertension    Lung nodules    Bilateral   Substance abuse (HCC)    Tuberculosis        Objective:    Wts  02/26/2024         ***  11/22/23 169 lb (76.7 kg)  10/04/23 168 lb 3.2 oz (76.3 kg)  08/17/23 168 lb (76.2 kg)       Min barrel  contour chest wall with bilateral   minimal insp /exp rhonchi  ***          Assessment

## 2024-02-26 ENCOUNTER — Ambulatory Visit: Admitting: Internal Medicine

## 2024-04-10 ENCOUNTER — Ambulatory Visit (INDEPENDENT_AMBULATORY_CARE_PROVIDER_SITE_OTHER): Admitting: Otolaryngology

## 2024-04-10 ENCOUNTER — Ambulatory Visit (INDEPENDENT_AMBULATORY_CARE_PROVIDER_SITE_OTHER): Admitting: Audiology

## 2024-04-14 ENCOUNTER — Ambulatory Visit: Admitting: Internal Medicine

## 2024-04-23 ENCOUNTER — Ambulatory Visit: Admitting: Internal Medicine

## 2024-05-02 ENCOUNTER — Ambulatory Visit (HOSPITAL_COMMUNITY)
Admission: RE | Admit: 2024-05-02 | Discharge: 2024-05-02 | Disposition: A | Discharge status: Home / Self Care | Source: Ambulatory Visit | Attending: *Deleted | Admitting: *Deleted

## 2024-05-02 ENCOUNTER — Ambulatory Visit (HOSPITAL_COMMUNITY)
Admission: RE | Admit: 2024-05-02 | Discharge: 2024-05-02 | Disposition: A | Discharge status: Home / Self Care | Source: Ambulatory Visit | Attending: Otolaryngology | Admitting: Otolaryngology

## 2024-05-02 DIAGNOSIS — K219 Gastro-esophageal reflux disease without esophagitis: Secondary | ICD-10-CM | POA: Diagnosis not present

## 2024-05-02 DIAGNOSIS — R1310 Dysphagia, unspecified: Secondary | ICD-10-CM

## 2024-05-02 NOTE — Evaluation (Signed)
 Modified Barium Swallow Study  Patient Details  Name: Derek [name] MRN: [id number] Date of Birth: May 14, 1961  Today's Date: 05/02/2024  Modified Barium Swallow completed.  Full report located under Chart Review in the Imaging Section.  History of Present Illness 63 yo male referred by ENT for MBS. He reports episodes of odynophagia and choking on food. He presented to outside hospital in January 2025 with food impaction (steak), but this cleared without endoscopy. Larnygoscopy 02/07/24 revealed moderate interarytenoid pachydermia and post cricoid edema. PMH includes: COPD, intermittent dysphonia   Clinical Impression Pt's oropharyngeal swallow is grossly within functional limits. He has penetration of both thin and nectar thick liquids, occurring as liquids reach the pyriform sinuses before the swallow and spill into the laryngeal vestibule, but he has good laryngeal closure that clears all penetration upon completion of the swallow (PAS 2 and 4, considered to be normal). Recommend continuing regular solids and thin liquids, and esophageal precautions handout was given to pt and his daughter.  Factors that may increase risk of adverse event in presence of aspiration Noe & Lianne 2021): Respiratory or GI disease  Swallow Evaluation Recommendations Recommendations: PO diet PO Diet Recommendation: Regular;Thin liquids (Level 0) Liquid Administration via: Cup;Straw Medication Administration: Whole meds with liquid Supervision: Patient able to self-feed Swallowing strategies  : Slow rate;Small bites/sips;Follow solids with liquids Postural changes: Position pt fully upright for meals;Stay upright 30-60 min after meals Oral care recommendations: Oral care BID (2x/day)      Leita SAILOR., M.A. CCC-SLP Acute Rehabilitation Services Office: [phone]  Secure chat preferred  05/02/2024,12:59 PM

## 2024-05-16 ENCOUNTER — Encounter (INDEPENDENT_AMBULATORY_CARE_PROVIDER_SITE_OTHER): Payer: Self-pay | Admitting: Otolaryngology

## 2024-05-16 ENCOUNTER — Ambulatory Visit: Payer: Self-pay | Admitting: Internal Medicine

## 2024-05-16 ENCOUNTER — Ambulatory Visit (INDEPENDENT_AMBULATORY_CARE_PROVIDER_SITE_OTHER): Admitting: Otolaryngology

## 2024-05-16 ENCOUNTER — Ambulatory Visit (INDEPENDENT_AMBULATORY_CARE_PROVIDER_SITE_OTHER): Admitting: Audiology

## 2024-05-16 VITALS — BP 139/91 | HR 96 | Temp 98.7°F

## 2024-05-16 DIAGNOSIS — K219 Gastro-esophageal reflux disease without esophagitis: Secondary | ICD-10-CM | POA: Diagnosis not present

## 2024-05-16 DIAGNOSIS — H903 Sensorineural hearing loss, bilateral: Secondary | ICD-10-CM

## 2024-05-16 DIAGNOSIS — R042 Hemoptysis: Secondary | ICD-10-CM | POA: Diagnosis not present

## 2024-05-16 DIAGNOSIS — F17210 Nicotine dependence, cigarettes, uncomplicated: Secondary | ICD-10-CM

## 2024-05-16 DIAGNOSIS — R490 Dysphonia: Secondary | ICD-10-CM

## 2024-05-16 DIAGNOSIS — Z720 Tobacco use: Secondary | ICD-10-CM

## 2024-05-16 DIAGNOSIS — R1310 Dysphagia, unspecified: Secondary | ICD-10-CM | POA: Diagnosis not present

## 2024-05-16 DIAGNOSIS — H918X3 Other specified hearing loss, bilateral: Secondary | ICD-10-CM

## 2024-05-16 MED ORDER — FAMOTIDINE 20 MG PO TABS: 20.0000 mg | ORAL_TABLET | Freq: Two times a day (BID) | ORAL | 3 refills | Status: AC

## 2024-05-16 NOTE — Progress Notes (Signed)
 Patient smoked before coming in to office. Patient is also also having BP managed.

## 2024-05-16 NOTE — Telephone Encounter (Signed)
 FYI Only or Action Required?: FYI only for provider.  Patient is followed in Pulmonology for COPD, last seen on 11/22/2023 by Derek Ozell NOVAK, MD.  Called Nurse Triage reporting Shortness of Breath, Cough, Hypertension, Hemoptysis, yellow sputum, Fatigue, Weakness, Numbness, Dizziness, and Chest Pain.  Symptoms began about a month ago.  Interventions attempted: Rescue inhaler, Maintenance inhaler, and Nebulizer treatments.  Symptoms are: rapidly worsening.  Triage Disposition: Go to ED Now (Notify PCP)  Patient/caregiver understands and will follow disposition?: Unsure    Copied from CRM [id number]. Topic: Clinical - Red Word Triage >> May 16, 2024  3:17 PM Derek [name] wrote: [facility] that prompted transfer to Nurse Triage:   Pt has had URI symptoms for about month Productive cough with yellow pleghm, small amount of blood present with last incident last week SOB, has history of COPD  Pt of Dr. Darlean Reason for Disposition  [1] MODERATE difficulty breathing (e.g., speaks in phrases, SOB even at rest, pulse 100-120) AND [2] NEW-onset or WORSE than normal  Answer Assessment - Initial Assessment Questions Advised pt go to ED for symptoms, call 911 if any worsening. Pt daughter stated understanding, did not confirm or deny intent to go. Scheduled appt for missed appt on 8/27 per daughter request, confirmed need for pt to be examined today in hospital and not to wait for appt on 10/16.     Derek [name] daughter on phone with pt in background  E2C2 Pulmonary Triage - Initial Assessment Questions Chief Complaint (e.g., cough, sob, wheezing, fever, chills, sweat or additional symptoms) *Go to specific symptom protocol after initial questions. Coughed up blood last week, just a tiny bit when coughed and just the one time last week No fever, chills Does have moments where feels like may pass out but also has other stuff going on, had some of that before this started, dizziness not gotten  worse per pt Very fatigued No nausea Has chest pain but doesn't think it's his heart, comes and goes, middle and upper chest, lasts not long, maybe for like a minute, ain't bad 3-4/10, with exertion, tiny bit better but not worse, for past month A bit weaker than usual Does have other symptoms going on but think more heart related, BP still goes up pretty high 141/96 this morning, gets a little lightheaded and like he might pass out, has to sit and calm down then feels much better, few months now at least, on 2 different BP meds, metoprolol and other one Tingling/numbness in feet, decently recent, only in morning time, both feet  How long have symptoms been present? About a month  Have you used your inhalers/maintenance medication? Yes If yes, What medications? Nebulizer using more frequently now, used to be morning and night but now throughout the day, every 2-3 hours, short-term relief from it Symbicort  Albuterol  inhaler every 3-4 hours, short-term  OXYGEN: Do you wear supplemental oxygen? No  Do you monitor your oxygen levels? No but was 92% at doc this morning ENT, did not mention symptoms to her  6. CARDIAC HISTORY: Do you have any history of heart disease? (e.g., heart attack, angina, bypass surgery, angioplasty)      HTN  7. LUNG HISTORY: Do you have any history of lung disease?  (e.g., pulmonary embolus, asthma, emphysema)     COPD  Missed his appt with Dr. Darlean a few weeks ago  Protocols used: Breathing Difficulty-A-AH

## 2024-05-16 NOTE — Progress Notes (Signed)
 ENT Progress Note:   Update 05/16/2024  Discussed the use of AI scribe software for clinical note transcription with the patient, who gave verbal consent to proceed.  History of Present Illness Derek [name] is a 63 year old male who presents with hearing asymmetry and SNHL on audiogram today. He had swallow study and here to discuss the results.  He occasionally coughs up blood, particularly when coughing hard to clear his chest in the morning. He is a heavy smoker, which he believes may contribute to this symptom. He recalls coughing up a small amount of blood about a week ago. A previous flexible laryngoscopy did not reveal any source of bleeding.  He was previously diagnosed with gastritis during an upper endoscopy in 2017.  Records Reviewed:  Initial Evaluation  Reason for Consult: hoarseness  and hx of smoking  HPI: Discussed the use of AI scribe software for clinical note transcription with the patient, who gave verbal consent to proceed.  History of Present Illness  Discussed the use of AI scribe software for clinical note transcription with the patient, who gave verbal consent to proceed.  History of Present Illness   Derek [name] is a 63 year old male with COPD who presents with hearing loss and voice changes.  He experiences hearing difficulties, which he associates with a previous COVID-19 infection. His hearing temporarily improves when he 'pops' his ears, similar to the sensation experienced at high altitudes. He also experiences intermittent tinnitus. He is hard of hearing and has to ask others to repeat what they say and raise their voice.   He describes voice changes characterized by intermittent raspiness, which he attributes to his long history of smoking. He has been smoking for fifty years, currently at a rate of two packs per day. He has attempted to quit smoking in the past using Chantix without success.  He has a history of hemoptysis, which occurred  approximately six months ago for about three days after he strained his throat. He also experiences occasional odynophagia and has had episodes of choking on food. He recalls a specific incident where a piece of steak and bread became lodged in his throat, leading to an emergency room visit where he was treated with medication typically used for hypoglycemia, which resolved the issue within forty-five minutes.  He consumes twelve to fifteen beers daily. He uses Symbicort  and albuterol  inhalers for his COPD. No lumps in his neck and no significant changes in his neck area. He experiences frequent phlegm production and nasal congestion.     Records Reviewed:  ED note 09/12/23 Patient 63 year old gentleman history of substance abuse asthma emphysema cigarette smoking hypertension ate steak at 7 PM unable to swallow feels that the steak is stuck in his upper esophagus exam awake alert cooperative cardiopulmonary general neuroexam negative visualized oropharynx no visualized foreign body. Clinical history [place] classic for esophageal food impaction will plan medical management center for surgical consult if first initial measures for   Diagnosis Date  Asthma  Emphysema of lung (CMS-HCC)  Glaucoma  Hypertension  Infectious viral hepatitis  Mycobacterial disease  Substance abuse (CMS-HCC)  Alcohol, Marijuana  Tuberculosis   Gave glucagon and it passed   Past Medical History:  Diagnosis Date   Arthritis    Asthma    COPD (chronic obstructive pulmonary disease) (HCC)    Emphysema lung (HCC)    Glaucoma    Hepatitis    C   Hypertension    Lung nodules  Bilateral   Substance abuse (HCC)    Tuberculosis     Past Surgical History:  Procedure Laterality Date   BIOPSY  11/09/2015   Procedure: BIOPSY;  Surgeon: Margo LITTIE Haddock, MD;  Location: [facility];  Service: Endoscopy;;  sigmoid colon polyps x3   COLONOSCOPY WITH PROPOFOL  N/A 11/09/2015   SLF:5 POLYPSREMOVED FROM RECTO-SIGMOID  COLON/INTERNAL HEMORRHOIDS   ESOPHAGOGASTRODUODENOSCOPY (EGD) WITH PROPOFOL  N/A 11/09/2015   DOQ:WNMFJO   EYE SURGERY Right    FUDUCIAL PLACEMENT N/A 11/09/2017   Procedure: PLACEMENT OF FUDUCIALS IN LEFT UPPER LOBE AND RIGHT LOWER LOBE LUNG;  Surgeon: Kerrin Elspeth BROCKS, MD;  Location: [facility];  Service: Thoracic;  Laterality: N/A;   HERNIA REPAIR Left    POLYPECTOMY  11/09/2015   Procedure: POLYPECTOMY;  Surgeon: Margo LITTIE Haddock, MD;  Location: [facility];  Service: Endoscopy;;  rectal polyps x3( one not retrieved   VIDEO BRONCHOSCOPY WITH ENDOBRONCHIAL NAVIGATION N/A 11/09/2017   Procedure: VIDEO BRONCHOSCOPY WITH ENDOBRONCHIAL NAVIGATION;  Surgeon: Kerrin Elspeth BROCKS, MD;  Location: [facility];  Service: Thoracic;  Laterality: N/A;    Family History  Problem Relation Age of Onset   Cancer Mother        lymph nodes   Ulcers Father    Cholecystitis Father    Colon cancer Neg Hx    Colon polyps Neg Hx     Social History:  reports that he has been smoking cigarettes. He has a 40 pack-year smoking history. He has never used smokeless tobacco. He reports current alcohol use. He reports current drug use. Drugs: Marijuana and Cocaine.  Allergies: No Known Allergies  Medications: I have reviewed the patient's current medications.  The PMH, PSH, Medications, Allergies, and SH were reviewed and updated.  ROS: Constitutional: Negative for fever, weight loss and weight gain. Cardiovascular: Negative for chest pain and dyspnea on exertion. Respiratory: Is not experiencing shortness of breath at rest. Gastrointestinal: Negative for nausea and vomiting. Neurological: Negative for headaches. Psychiatric: The patient is not nervous/anxious  There were no vitals taken for this visit. There is no height or weight on file to calculate BMI.  PHYSICAL EXAM:  Exam: General: Well-developed, well-nourished Communication and Voice: raspy Respiratory Respiratory effort: Equal inspiration and  expiration without stridor Cardiovascular Peripheral Vascular: Warm extremities with equal color/perfusion Eyes: No nystagmus with equal extraocular motion bilaterally Neuro/Psych/Balance: Patient oriented to person, place, and time; Appropriate mood and affect; Gait is intact with no imbalance; Cranial nerves I-XII are intact Head and Face Inspection: Normocephalic and atraumatic without mass or lesion Palpation: Facial skeleton intact without bony stepoffs Salivary Glands: No mass or tenderness Facial Strength: Facial motility symmetric and full bilaterally ENT Pinna: External ear intact and fully developed External canal: Canal is patent with intact skin Tympanic Membrane: Clear and mobile External Nose: No scar or anatomic deformity Internal Nose: Septum is relatively straight. No polyp, or purulence. Mucosal edema and erythema present.  Bilateral inferior turbinate hypertrophy.  Lips, Teeth, and gums: Mucosa and teeth intact and viable TMJ: No pain to palpation with full mobility Oral cavity/oropharynx: No erythema or exudate, no lesions present Neck Neck and Trachea: Midline trachea without mass or lesion Thyroid: No mass or nodularity Lymphatics: No lymphadenopathy   Studies Reviewed: CXR 12/27/23 Narrative & Impression  CLINICAL DATA:  Chronic cough.  History of COPD.   EXAM: CHEST - 2 VIEW   COMPARISON:  09/12/2023 and older exams.   FINDINGS: Cardiac silhouette is normal in size and configuration. No  mediastinal or hilar masses. No evidence of adenopathy. Superior left hilar retraction is stable.   Left upper lobe opacity consistent with a cavitary lesion, 4 cm in long axis, associated with 3 vascular clips, is stable. Additional linear type opacities extend laterally and superiorly from this, also unchanged, consistent with scarring. Remainder of the left lung is clear.   Right perihilar surgical vascular clips associated with right lateral hilar opacity, also  unchanged consistent with scarring.   Lungs are hyperexpanded but otherwise clear.   No pleural effusion or pneumothorax.   Skeletal structures are intact.   IMPRESSION: 1. No acute cardiopulmonary disease. 2. Treatment related changes in both lungs. Apparent cavitary lesion in the left upper lobe with associated scarring is stable.     Esophagram 05/02/24 IMPRESSION: 1. Mild gastroesophageal reflux noted, to the level of the midthoracic esophagus. 2. Barium tablet became stuck at GE junction, despite evidence of any stricture or narrowing.  MBS Pt's oropharyngeal swallow is grossly within functional limits. He has penetration of both thin and nectar thick liquids, occurring as liquids reach the pyriform sinuses before the swallow and spill into the laryngeal vestibule, but he has good laryngeal closure that clears all penetration upon completion of the swallow (PAS 2 and 4, considered to be normal). Recommend continuing regular solids and thin liquids, and esophageal precautions handout was given to pt and his daughter.  Assessment/Plan: No diagnosis found.   Assessment and Plan Assessment & Plan Asymmetric bilateral sensorineural hearing loss, right worse than left Significant bilateral sensorineural hearing loss with asymmetry, 20 dB difference across all frequencies, will require MRI IAC to rule out CPA tumor - Order MRI IAC to evaluate  - hearing aid consultation   Hemoptysis Intermittent hemoptysis, likely related to heavy smoking. Previous scope exam negative for bleeding source. - Reorder CT neck to evaluate for potential causes of hemoptysis. - see Pulmonary to evaluate  Gastroesophageal reflux disease (GERD) GERD with reflux observed in swallow study, no current medication. - Prescribe Pepcid  twice daily. - diet and lifestyle changes  Dysphagia Intermittent dysphagia with previous food impaction requiring visit to ED. Occasional discomfort and choking.  Episode  of penetration on MBS but no aspiration OP swallow grossly intact, evidence of GERD on esophagram - management of GERD - continue to monitor   CT neck, MRI IAC  Elena Larry, MD Otolaryngology [facility] Phone: [phone] Fax: [phone]    05/16/2024, 10:43 AM

## 2024-05-16 NOTE — Progress Notes (Signed)
  [address], [address] [place], [place] [address] [phone]  Audiological Evaluation    Name: Derek [name]     DOB:   Dec 04, 1960      MRN:   [id number]                                                                                     Service Date: 05/16/2024     Accompanied by: daughter   Patient comes today after Dr. Soldatova, ENT sent a referral for a hearing evaluation due to concerns with hearing loss.   Symptoms Yes Details  Hearing loss  [x]  Hearing loss in both ears, known to be worse in the right ear.  Tinnitus  [x]  Reports is perceived sometimes  Ear pain/ infections/pressure  []    Balance problems  []    Noise exposure history  [x]  construction  Previous ear surgeries  [x]    Family history of hearing loss  [x]  Father with age  Amplification  []    Other  []      Otoscopy: Right ear: Clear external ear canal and notable landmarks visualized on the tympanic membrane. Left ear:  Clear external ear canal and notable landmarks visualized on the tympanic membrane.  Tympanometry: Right ear: Type A- Normal external ear canal volume with normal middle ear pressure and tympanic membrane compliance. Left ear: Type As- Normal external ear canal volume with normal middle ear pressure and low tympanic membrane compliance.   Pure tone Audiometry: Right ear- Mild to severe essentially sensorineural hearing loss from 125 Hz - 8000 Hz. Left ear-  Moderate to profound essentially sensorineural hearing loss from 125 Hz - 8000 Hz. Air-bone gap noted in both ears at 4000 Hz.  Speech Audiometry: Right ear- Speech Reception Threshold (SRT) was obtained at 65 dBHL. Left ear-Speech Reception Threshold (SRT) was obtained at 45 dBHL.   Word Recognition Score Tested using NU-6 (recorded) Right ear: 48% was obtained at a presentation level of 90 dBHL with contralateral masking which is deemed as  poor. Left ear: 92% was obtained at a presentation level of 85 dBHL with  contralateral masking which is deemed as  excellent.   The hearing test results were completed under headphones and re-checked with inserts and results are deemed to be of good reliability. Test technique:  conventional    Impression: There is a significant difference in pure-tone thresholds between ears, worse in the right ear.   Recommendations: Follow up with ENT as scheduled for today. Return for a hearing evaluation if concerns with hearing changes arise or per MD recommendation. Consider a communication needs assessment after medical clearance for hearing aids is obtained.   Ashaad Gaertner MARIE LEROUX-MARTINEZ, AUD

## 2024-05-22 NOTE — Addendum Note (Signed)
 Addended by: Rod Majerus on: 05/22/2024 08:53 AM   Modules accepted: Level of Service

## 2024-06-12 ENCOUNTER — Encounter: Payer: Self-pay | Admitting: Internal Medicine

## 2024-06-12 ENCOUNTER — Ambulatory Visit: Admitting: Internal Medicine

## 2024-06-12 VITALS — BP 146/86 | HR 51 | Ht 71.0 in | Wt 152.6 lb

## 2024-06-12 DIAGNOSIS — J4489 Other specified chronic obstructive pulmonary disease: Secondary | ICD-10-CM

## 2024-06-12 DIAGNOSIS — J449 Chronic obstructive pulmonary disease, unspecified: Secondary | ICD-10-CM

## 2024-06-12 DIAGNOSIS — A310 Pulmonary mycobacterial infection: Secondary | ICD-10-CM

## 2024-06-12 DIAGNOSIS — F17210 Nicotine dependence, cigarettes, uncomplicated: Secondary | ICD-10-CM

## 2024-06-12 DIAGNOSIS — R490 Dysphonia: Secondary | ICD-10-CM | POA: Diagnosis not present

## 2024-06-12 MED ORDER — SPIRIVA RESPIMAT 2.5 MCG/ACT IN AERS: 2.0000 | INHALATION_SPRAY | Freq: Every day | RESPIRATORY_TRACT | Status: DC

## 2024-06-12 MED ORDER — SPIRIVA RESPIMAT 2.5 MCG/ACT IN AERS: 2.0000 | INHALATION_SPRAY | Freq: Every day | RESPIRATORY_TRACT | 11 refills | Status: DC

## 2024-06-12 NOTE — Patient Instructions (Addendum)
 Plan A = Automatic = Always=    symbicort  80 x 2 puffs each am followed by 2 puffs of  spiriva  and repeat only the symbicort  12 hours later   Plan B = Backup (to supplement plan A, not to replace it) Use your albuterol  inhaler as a rescue medication to be used if you can't catch your breath by resting or slowing your pace  or doing a relaxed purse lip breathing pattern.  - The less you use it, the better it will work when you need it. - Ok to use the inhaler up to 2 puffs  every 4 hours if you must but call for appointment if use goes up over your usual need - Don't leave home without it !!  (think of it like the spare tire or starter fluid for your car)   Plan C = Crisis (instead of Plan B but only if Plan B stops working) - only use your albuterol  nebulizer if you first try Plan B and it fails to help > ok to use the nebulizer up to every 4 hours but if start needing it regularly call for immediate appointment  Prednisone  10 mg take  4 each am x 2 days,   2 each am x 2 days,  1 each am x 2 days and stop   My office will be contacting you by phone for referral to lung cancer screening   [phone]) - if you don't hear back from my office within one week,  please call us  back or notify us  thru MyChart and we'll address it right away.    The key is to stop smoking completely before smoking completely stops you!   Add Needs alpha one phenotype check next ov   Please schedule a follow up visit in 3 months but call sooner if needed

## 2024-06-12 NOTE — Assessment & Plan Note (Addendum)
 Active smoker with MAI  - 10/04/2023   add spiriva  to symbicort  but reduce to 80 due to chronic infection  - 11/22/2023  After extensive coaching inhaler device,  effectiveness =  70%   (short Ti, double triggering hfa) > continue symb 80 x 2 in am/ spiriva  x 2 in am and symbicort  80 x 2 in pm  - PFT's  12/27/23  FEV1 1.03 (28 % ) ratio 0.35  p 1% improvement from saba p symbicort  prior to study with DLCO   19.8 (71%)   and FV curve classically concave    >>>06/12/2024  After extensive coaching inhaler device,  effectiveness =    90% with SMI > restart spiriva  and combine with symb 80 to minize ICS in view of MAI  >>> Will add short course pred for mild aecopd no better p zpak with no active purulent sputum

## 2024-06-12 NOTE — Assessment & Plan Note (Addendum)
 Counseled re importance of smoking cessation but did not meet time criteria for separate billing    Low-dose CT lung cancer screening is recommended for patients who are 77-63 years of age with a 20+ pack-year history of smoking and who are currently smoking or quit <=15 years ago. No coughing up blood  No unintentional weight loss of > 15 pounds in the last 6 months - pt is eligible for scanning yearly until 15 y p quits smoking > referred.   Discussed in detail all the  indications, usual  risks and alternatives  relative to the benefits with patient who agrees to proceed with w/u as outlined.            Each maintenance medication was reviewed in detail including emphasizing most importantly the difference between maintenance and prns and under what circumstances the prns are to be triggered using an action plan format where appropriate.  Total time for H and P, chart review, counseling, reviewing SMI/ respimat device(s) and generating customized AVS unique to this office visit / same day charting = 35 min

## 2024-06-12 NOTE — Progress Notes (Signed)
 Derek [name], male    DOB: January 26, 1961    MRN: [id number]   Brief patient profile:  63  yowm  active smoker asthma as child  outgrew by 8th grade   but started smoking age 63 and by age 39 started back on inhalers referred to pulmonary clinic in [place]  10/04/2023 by Efrain (seeing for MAI)  for doe     History of Present Illness  10/04/2023  Pulmonary/ 1st office eval/ Mck[facility] / [place] [facility]  / still smoking/ maint on symbicort   Chief Complaint  Patient presents with   Consult       Dyspnea:  walking thru grocery ok and parking lt ok  as long as paces himself = MMRC2 = can't walk a nl pace on a flat grade s sob but does fine slow and flat   Sleep: bed r side down one pillow wake up nightly and needs neb middle of night most nights  SABA use: neb excess use  02: none  Rec Plan A = Automatic = Always=    Symbicort  80 Take 2 puffs first thing in am and then another 2 puffs about 12 hours later and after the morning symbicort  take spiriva  x 2 puffs  Work on inhaler technique:   Plan B = Backup (to supplement plan A, not to replace it) Only use your albuterol  inhaler as a rescue medication  Plan C = Crisis (instead of Plan B but only if Plan B stops working) - only use your albuterol  nebulizer if you first try Plan B Flutter valve as much as possible   Please schedule a follow up office visit in 6 weeks, call sooner if needed with all medications /inhalers/ solutions in hand    11/22/2023  f/u ov/[place] office/Gauge Winski re: AB maint on symbicort / spirviva did  bring meds / taking spiriva  in pm / stopped rx for MAI in Feb 2025  Chief Complaint  Patient presents with   Follow-up    6 week follow up  , pt is still smoking , has dry cough , not taking for cough   Dyspnea:  has to walk slow / flat = MMRC2 = can't walk a nl pace on a flat grade s sob but does fine slow and flat  Cough: clear  Sleeping: flat bed /  SABA use: neb once at night / hfa avg 3x in 24 h 02: none  Re Work on  inhaler technique:   Try to reduce your use of albuterol  if you don't really need it to catch your breath My office will be contacting you by phone for referral to ENT for hoarseness > 6/12/125 no ca Please schedule a follow up visit in 3 months but call sooner if needed   - PFT's  12/27/23  FEV1 1.03 (28 % ) ratio 0.35  p 1% improvement from saba p symbicort  prior to study with DLCO   19.8 (71%)   and FV curve classically concave     06/12/2024  f/u  ov/[place] office/Sueko Dimichele re: GOLD 4 COPD  maint on symbicort    s/p aecopd rx only with zpak, no longer on lama Chief Complaint  Patient presents with   Shortness of Breath    F/u coughing with clear mucus    Dyspnea: ok pace   slow flat surface / no hc parking  Cough: min rattliing / has flutter  Sleeping: flat bed / on side s resp    resp cc  SABA use: 4 x  daily not prn  02: none   Lung cancer screening: referred 06/12/2024    No obvious day to day or daytime variability or assoc excess/ purulent sputum or mucus plugs or hemoptysis or cp or chest tightness, subjective wheeze or overt  hb symptoms.    Also denies any obvious fluctuation of symptoms with weather or environmental changes or other aggravating or alleviating factors except as outlined above   No unusual exposure hx or h/o childhood pna/ asthma or knowledge of premature birth.  Current Allergies, Complete Past Medical History, Past Surgical History, Family History, and Social History were reviewed in [employer] record.  ROS  The following are not active complaints unless bolded Hoarseness, sore throat, dysphagia, dental problems, itching, sneezing,  nasal congestion or discharge of excess mucus or purulent secretions, ear ache,   fever, chills, sweats, unintended wt loss or wt gain, classically pleuritic or exertional cp,  orthopnea pnd or arm/hand swelling  or leg swelling, presyncope, palpitations, abdominal pain, anorexia, nausea, vomiting, diarrhea   or change in bowel habits or change in bladder habits, change in stools or change in urine, dysuria, hematuria,  rash, arthralgias, visual complaints, headache, numbness, weakness or ataxia or problems with walking or coordination,  change in mood or  memory.        Current Meds  Medication Sig   albuterol  (PROVENTIL  HFA;VENTOLIN  HFA) 108 (90 Base) MCG/ACT inhaler Inhale 2 puffs into the lungs every 6 (six) hours as needed for wheezing or shortness of breath.   albuterol  (PROVENTIL ) (2.5 MG/3ML) 0.083% nebulizer solution Take 3 mLs (2.5 mg total) by nebulization every 4 (four) hours as needed for wheezing or shortness of breath.   budesonide -formoterol  (SYMBICORT ) 80-4.5 MCG/ACT inhaler Take 2 puffs first thing in am and then another 2 puffs about 12 hours later.   Cyanocobalamin (VITAMIN B-12 PO) Take 1 tablet by mouth daily.   famotidine  (PEPCID ) 20 MG tablet Take 1 tablet (20 mg total) by mouth 2 (two) times daily.   metoprolol succinate (TOPROL-XL) 50 MG 24 hr tablet Take 50 mg by mouth daily.              Past Medical History:  Diagnosis Date   Arthritis    Asthma    COPD (chronic obstructive pulmonary disease) (HCC)    Emphysema lung (HCC)    Glaucoma    Hepatitis    C   Hypertension    Lung nodules    Bilateral   Substance abuse (HCC)    Tuberculosis       Objective:    Wts   06/12/2024     152  11/22/23 169 lb (76.7 kg)  10/04/23 168 lb 3.2 oz (76.3 kg)  08/17/23 168 lb (76.2 kg)    Vital signs reviewed  06/12/2024  - Note at rest 02 sats  94% on RA   General appearance:    amb wm nad  HEENT :  Oropharynx  clear      NECK :  without JVD/Nodes/TM/ nl carotid upstrokes bilaterally   LUNGS: no acc muscle use,  Mod barrel  contour chest wall with bilateral  Distant exp  wheeze and  without cough on insp or exp maneuvers and mod  Hyperresonant  to  percussion bilaterally     CV:  RRR  no s3 or murmur or increase in P2, and no edema   ABD:  soft and  nontender with pos mid insp Hoover's  in the supine position. No  bruits or organomegaly appreciated, bowel sounds nl  MS:   Ext warm without deformities or   obvious joint restrictions , calf tenderness, cyanosis or clubbing  SKIN: warm and dry without lesions    NEURO:  alert, approp, nl sensorium with  no motor or cerebellar deficits apparent.         Assessment   Assessment & Plan Mycobacterium avium infection (HCC)  Hoarseness  Asthmatic bronchitis , chronic (HCC)  COPD GOLD 4/ AB Active smoker with MAI  - 10/04/2023   add spiriva  to symbicort  but reduce to 80 due to chronic infection  - 11/22/2023  After extensive coaching inhaler device,  effectiveness =  70%   (short Ti, double triggering hfa) > continue symb 80 x 2 in am/ spiriva  x 2 in am and symbicort  80 x 2 in pm  - PFT's  12/27/23  FEV1 1.03 (28 % ) ratio 0.35  p 1% improvement from saba p symbicort  prior to study with DLCO   19.8 (71%)   and FV curve classically concave    >>>06/12/2024  After extensive coaching inhaler device,  effectiveness =    90% with SMI > restart spiriva  and combine with symb 80 to minize ICS in view of MAI  >>> Will add short course pred for mild aecopd no better p zpak with no active purulent sputum  Cigarette smoker Counseled re importance of smoking cessation but did not meet time criteria for separate billing    Low-dose CT lung cancer screening is recommended for patients who are 39-52 years of age with a 20+ pack-year history of smoking and who are currently smoking or quit <=15 years ago. No coughing up blood  No unintentional weight loss of > 15 pounds in the last 6 months - pt is eligible for scanning yearly until 15 y p quits smoking > referred.   Discussed in detail all the  indications, usual  risks and alternatives  relative to the benefits with patient who agrees to proceed with w/u as outlined.            Each maintenance medication was reviewed in detail including  emphasizing most importantly the difference between maintenance and prns and under what circumstances the prns are to be triggered using an action plan format where appropriate.  Total time for H and P, chart review, counseling, reviewing SMI/ respimat device(s) and generating customized AVS unique to this office visit / same day charting = 35 min         AVS  Patient Instructions  Plan A = Automatic = Always=    symbicort  80 x 2 puffs each am followed by 2 puffs of  spiriva  and repeat only the symbicort  12 hours later   Plan B = Backup (to supplement plan A, not to replace it) Use your albuterol  inhaler as a rescue medication to be used if you can't catch your breath by resting or slowing your pace  or doing a relaxed purse lip breathing pattern.  - The less you use it, the better it will work when you need it. - Ok to use the inhaler up to 2 puffs  every 4 hours if you must but call for appointment if use goes up over your usual need - Don't leave home without it !!  (think of it like the spare tire or starter fluid for your car)   Plan C = Crisis (instead of Plan B but only if Plan B stops working) - only  use your albuterol  nebulizer if you first try Plan B and it fails to help > ok to use the nebulizer up to every 4 hours but if start needing it regularly call for immediate appointment  Prednisone  10 mg take  4 each am x 2 days,   2 each am x 2 days,  1 each am x 2 days and stop   My office will be contacting you by phone for referral to lung cancer screening   [phone]) - if you don't hear back from my office within one week,  please call us  back or notify us  thru MyChart and we'll address it right away.    The key is to stop smoking completely before smoking completely stops you!   Add Needs alpha one phenotype check next ov   Please schedule a follow up visit in 3 months but call sooner if needed    Ozell America, MD 06/12/2024

## 2024-06-13 ENCOUNTER — Ambulatory Visit (HOSPITAL_COMMUNITY)
Admission: RE | Admit: 2024-06-13 | Discharge: 2024-06-13 | Disposition: A | Discharge status: Home / Self Care | Source: Ambulatory Visit | Attending: Otolaryngology | Admitting: Otolaryngology

## 2024-06-13 ENCOUNTER — Encounter (HOSPITAL_COMMUNITY): Payer: Self-pay | Admitting: Radiology

## 2024-06-13 DIAGNOSIS — H918X3 Other specified hearing loss, bilateral: Secondary | ICD-10-CM | POA: Insufficient documentation

## 2024-06-13 DIAGNOSIS — J432 Centrilobular emphysema: Secondary | ICD-10-CM | POA: Insufficient documentation

## 2024-06-13 DIAGNOSIS — R042 Hemoptysis: Secondary | ICD-10-CM | POA: Diagnosis present

## 2024-06-13 DIAGNOSIS — H903 Sensorineural hearing loss, bilateral: Secondary | ICD-10-CM | POA: Diagnosis present

## 2024-06-13 DIAGNOSIS — Z720 Tobacco use: Secondary | ICD-10-CM | POA: Diagnosis present

## 2024-06-13 DIAGNOSIS — R918 Other nonspecific abnormal finding of lung field: Secondary | ICD-10-CM | POA: Diagnosis not present

## 2024-06-13 MED ORDER — IOHEXOL 300 MG/ML  SOLN
75.0000 mL | Freq: Once | INTRAMUSCULAR | Status: AC | PRN
  Administered 2024-06-13: 75 mL via INTRAVENOUS

## 2024-06-13 MED ORDER — GADOBUTROL 1 MMOL/ML IV SOLN
7.0000 mL | Freq: Once | INTRAVENOUS | Status: AC | PRN
  Administered 2024-06-13: 7 mL via INTRAVENOUS

## 2024-07-18 ENCOUNTER — Other Ambulatory Visit: Payer: Self-pay

## 2024-07-18 ENCOUNTER — Encounter (INDEPENDENT_AMBULATORY_CARE_PROVIDER_SITE_OTHER): Payer: Self-pay

## 2024-07-18 ENCOUNTER — Telehealth: Payer: Self-pay

## 2024-07-18 ENCOUNTER — Ambulatory Visit (INDEPENDENT_AMBULATORY_CARE_PROVIDER_SITE_OTHER): Admitting: Otolaryngology

## 2024-07-18 ENCOUNTER — Ambulatory Visit (INDEPENDENT_AMBULATORY_CARE_PROVIDER_SITE_OTHER)

## 2024-07-18 VITALS — BP 130/81 | HR 81 | Temp 98.0°F | Wt 155.0 lb

## 2024-07-18 DIAGNOSIS — H918X3 Other specified hearing loss, bilateral: Secondary | ICD-10-CM | POA: Diagnosis not present

## 2024-07-18 DIAGNOSIS — R042 Hemoptysis: Secondary | ICD-10-CM

## 2024-07-18 DIAGNOSIS — Z87891 Personal history of nicotine dependence: Secondary | ICD-10-CM

## 2024-07-18 DIAGNOSIS — Z720 Tobacco use: Secondary | ICD-10-CM

## 2024-07-18 DIAGNOSIS — F17210 Nicotine dependence, cigarettes, uncomplicated: Secondary | ICD-10-CM

## 2024-07-18 DIAGNOSIS — Z122 Encounter for screening for malignant neoplasm of respiratory organs: Secondary | ICD-10-CM

## 2024-07-18 NOTE — Telephone Encounter (Signed)
 Lung Cancer Screening Narrative/Criteria Questionnaire (Cigarette Smokers Only- No Cigars/Pipes/vapes)   Derek [name]   SDMV:07/30/2024 at 1:30 [name]        1961-01-31               LDCT: 08/01/2024 at 7:30 am [facility]    63 y.o.   Phone: [phone]  Lung Screening Narrative (confirm age 55-77 yrs Medicare / 50-80 yrs Private pay insurance)   Insurance information: Medicaid   Referring Provider: Darlean   This screening involves an initial phone call with a team member from our program. It is called a shared decision making visit. The initial meeting is required by  insurance and Medicare to make sure you understand the program. This appointment takes about 15-20 minutes to complete. You will complete the screening scan at your scheduled date/time.  This scan takes about 5-10 minutes to complete. You can eat and drink normally before and after the scan.  Criteria questions for Lung Cancer Screening:   Are you a current or former smoker? Current Age began smoking: 10   If you are a former smoker, what year did you quit smoking? Never quit. (within 15 yrs)   To calculate your smoking history, I need an accurate estimate of how many packs of cigarettes you smoked per day and for how many years. (Not just the number of PPD you are now smoking)   Years smoking 53 x Packs per day 2 = Pack years 106   (at least 20 pack yrs)   (Make sure they understand that we need to know how much they have smoked in the past, not just the number of PPD they are smoking now)  Do you have a personal history of cancer?  No    Do you have a family history of cancer? Yes  (cancer type and and relative) Brother and Mother had cancer.   Are you coughing up blood?  No-however he did 04/2024  Have you had unexplained weight loss of 15 lbs or more in the last 6 months? Not in 6 months but he has in 8 months.  It looks like you meet all criteria.  When would be a good time for us  to schedule you for this  screening?   Additional information: N/A

## 2024-07-20 NOTE — Progress Notes (Signed)
 HPI:   Discussed the use of AI scribe software for clinical note transcription with the patient, who gave verbal consent to proceed.  History of Present Illness Derek [name] is a 63 year old male who presents with a history of coughing up blood and asymmetric hearing loss. He is accompanied by his father. He is under the care of a pulmonologist, Dr. Katina January.  He has a history of coughing up blood and is currently being evaluated for potential lung issues. A CT scan of his neck revealed findings in his lung. He is scheduled for a lung cancer screening CT in two weeks. He has a history of Mycobacterium avium complex (MAC) infection, and there are nodules in his lung that may have grown. No biopsy has been performed yet.  He also has a history of asymmetric hearing loss. Imaging of the inner ear, including a CT scan or MRI, showed no growth on the nerve. He has been using a hearing aid provided by the clinic, which he reports works well for him.  No additional ear, nose, or throat concerns were reported.    PMH/Meds/All/SocHx/FamHx/ROS: Past Medical History:  Diagnosis Date   Arthritis    Asthma    COPD (chronic obstructive pulmonary disease) (HCC)    Emphysema lung (HCC)    Glaucoma    Hepatitis    C   Hypertension    Lung nodules    Bilateral   Substance abuse (HCC)    Tuberculosis    Past Surgical History:  Procedure Laterality Date   BIOPSY  11/09/2015   Procedure: BIOPSY;  Surgeon: Margo LITTIE Haddock, MD;  Location: [facility];  Service: Endoscopy;;  sigmoid colon polyps x3   COLONOSCOPY WITH PROPOFOL  N/A 11/09/2015   SLF:5 POLYPSREMOVED FROM RECTO-SIGMOID COLON/INTERNAL HEMORRHOIDS   ESOPHAGOGASTRODUODENOSCOPY (EGD) WITH PROPOFOL  N/A 11/09/2015   DOQ:WNMFJO   EYE SURGERY Right    FUDUCIAL PLACEMENT N/A 11/09/2017   Procedure: PLACEMENT OF FUDUCIALS IN LEFT UPPER LOBE AND RIGHT LOWER LOBE LUNG;  Surgeon: Kerrin Elspeth BROCKS, MD;  Location: [facility] OR;  Service: Thoracic;   Laterality: N/A;   HERNIA REPAIR Left    POLYPECTOMY  11/09/2015   Procedure: POLYPECTOMY;  Surgeon: Margo LITTIE Haddock, MD;  Location: [facility];  Service: Endoscopy;;  rectal polyps x3( one not retrieved   VIDEO BRONCHOSCOPY WITH ENDOBRONCHIAL NAVIGATION N/A 11/09/2017   Procedure: VIDEO BRONCHOSCOPY WITH ENDOBRONCHIAL NAVIGATION;  Surgeon: Kerrin Elspeth BROCKS, MD;  Location: [facility];  Service: Thoracic;  Laterality: N/A;   No family history of bleeding disorders, wound healing problems or difficulty with anesthesia.  Social Connections: Not on file    Current Outpatient Medications:    albuterol  (PROVENTIL  HFA;VENTOLIN  HFA) 108 (90 Base) MCG/ACT inhaler, Inhale 2 puffs into the lungs every 6 (six) hours as needed for wheezing or shortness of breath., Disp: 3 Inhaler, Rfl: 1   albuterol  (PROVENTIL ) (2.5 MG/3ML) 0.083% nebulizer solution, Take 3 mLs (2.5 mg total) by nebulization every 4 (four) hours as needed for wheezing or shortness of breath., Disp: 150 mL, Rfl: 11   budesonide -formoterol  (SYMBICORT ) 80-4.5 MCG/ACT inhaler, Take 2 puffs first thing in am and then another 2 puffs about 12 hours later., Disp: 1 each, Rfl: 11   Cyanocobalamin (VITAMIN B-12 PO), Take 1 tablet by mouth daily., Disp: , Rfl:    famotidine  (PEPCID ) 20 MG tablet, Take 1 tablet (20 mg total) by mouth 2 (two) times daily., Disp: 30 tablet, Rfl: 3   metoprolol succinate (  TOPROL-XL) 50 MG 24 hr tablet, Take 50 mg by mouth daily., Disp: , Rfl:    Tiotropium Bromide (SPIRIVA  RESPIMAT) 2.5 MCG/ACT AERS, Inhale 2 puffs into the lungs daily., Disp: 4 g, Rfl: 11   Tiotropium Bromide (SPIRIVA  RESPIMAT) 2.5 MCG/ACT AERS, Inhale 2 puffs into the lungs daily., Disp: , Rfl:    Tiotropium Bromide Monohydrate  (SPIRIVA  RESPIMAT) 2.5 MCG/ACT AERS, 2 puffs each am (Patient not taking: Reported on 07/18/2024), Disp: 1 each, Rfl: 11 A complete ROS was performed with pertinent positives/negatives noted in the HPI. The remainder of the ROS  are negative.   Physical Exam:  BP 130/81 (BP Location: Right Arm, Patient Position: Sitting, Cuff Size: Normal)   Pulse 81   Temp 98 F (36.7 C)   Wt 155 lb (70.3 kg)   SpO2 90%   BMI 21.62 kg/m  General: Well developed, chronically ill-appearing. No acute distress. Head/Face: Normocephalic. No sinus tenderness. Facial nerve intact and equal bilaterally. No facial lacerations. Eyes: PERRL, no scleral icterus or conjunctival hemorrhage. EOMI. Ears: No gross deformity. Normal external canal. Tympanic membrane in tact bilaterally Hearing: Normal speech reception.  Nose: No gross deformity or lesions. No purulent discharge. No turbinate hypertrophy. Mouth/Oropharynx: Lips without any lesions. No mucosal lesions within the oropharynx. No tonsillar enlargement, exudate, or lesions. Pharyngeal walls symmetrical. Uvula midline. Tongue midline without lesions. Larynx: See TFL if applicable Nasopharynx: See TFL if applicable Neck: Trachea midline. No masses. No thyromegaly or nodules palpated. No crepitus. Lymphatic: No lymphadenopathy in the neck. Respiratory: No stridor or distress. Room air. Cardiovascular: Regular rate and rhythm. Extremities: No edema or cyanosis. Warm and well-perfused. Skin: No scars or lesions on face or neck. Neurologic: CN II-XII grossly intact. Moving all extremities without gross abnormality. Other:  Independent Review of Additional Tests or Records: None Procedures: None Impression & Plans: Assessment & Plan Asymmetric hearing loss Imaging showed no nerve growth. Hearing aid effective. - MRI IAC without any evidence of CPA tumor - Continue current hearing aid. - Schedule audiogram every 2 years.  Hemoptysis - significant smoking hx - CT neck showing left upper lobe cavitary mass - Patient being evaluated by pulmonologist  Follow-up as needed.  Derek Malkin, DO [facility]

## 2024-07-30 ENCOUNTER — Ambulatory Visit: Admitting: *Deleted

## 2024-07-30 ENCOUNTER — Encounter: Payer: Self-pay | Admitting: *Deleted

## 2024-07-30 DIAGNOSIS — F17210 Nicotine dependence, cigarettes, uncomplicated: Secondary | ICD-10-CM

## 2024-07-30 NOTE — Patient Instructions (Signed)

## 2024-07-30 NOTE — Progress Notes (Signed)
 Virtual Visit via Video Note  I connected with Derek [name] on 07/30/24 at  1:30 PM EST by a video enabled telemedicine application and verified that I am speaking with the correct person using two identifiers.  Location: Patient: in home Provider: [address] [place], [place], [address]   Shared Decision Making Visit Lung Cancer Screening Program [phone])   Eligibility: Age 63 y.o. Pack Years Smoking History Calculation 106 (# packs/per year x # years smoked) Recent History of coughing up blood  no Unexplained weight loss? no ( >Than 15 pounds within the last 6 months ) Prior History Lung / other cancer no (Diagnosis within the last 5 years already requiring surveillance chest CT Scans). Smoking Status Current Smoker Former Smokers: Years since quit:  NA  Quit Date: NA  Visit Components: Discussion included one or more decision making aids. yes Discussion included risk/benefits of screening. yes Discussion included potential follow up diagnostic testing for abnormal scans. yes Discussion included meaning and risk of over diagnosis. yes Discussion included meaning and risk of False Positives. yes Discussion included meaning of total radiation exposure. yes  Counseling Included: Importance of adherence to annual lung cancer LDCT screening. yes Impact of comorbidities on ability to participate in the program. yes Ability and willingness to under diagnostic treatment. yes  Smoking Cessation Counseling: Current Smokers:  Discussed importance of smoking cessation. yes Information about tobacco cessation classes and interventions provided to patient. yes Patient provided with ticket for LDCT Scan. yes Symptomatic Patient. yes  Counseling NA Diagnosis Code: Tobacco Use Z72.0 Asymptomatic Patient yes  Counseling (Intermediate counseling: > three minutes counseling) [id number]  Counseled patient 4 minutes regarding tobacco use.   Former Smokers:  Discussed the  importance of maintaining cigarette abstinence. yes Diagnosis Code: Personal History of Nicotine Dependence. S12.108 Information about tobacco cessation classes and interventions provided to patient. Yes Patient provided with ticket for LDCT Scan. yes Written Order for Lung Cancer Screening with LDCT placed in Epic. Yes (CT Chest Lung Cancer Screening Low Dose W/O CM) [id number] Z12.2-Screening of respiratory organs Z87.891-Personal history of nicotine dependence   Josette Ranger, RN  07/30/24

## 2024-08-01 ENCOUNTER — Encounter (HOSPITAL_COMMUNITY): Payer: Self-pay

## 2024-08-01 ENCOUNTER — Ambulatory Visit (HOSPITAL_COMMUNITY)
Admission: RE | Admit: 2024-08-01 | Discharge: 2024-08-01 | Disposition: A | Source: Ambulatory Visit | Attending: Acute Care | Admitting: Acute Care

## 2024-08-01 DIAGNOSIS — F17210 Nicotine dependence, cigarettes, uncomplicated: Secondary | ICD-10-CM

## 2024-08-01 DIAGNOSIS — Z122 Encounter for screening for malignant neoplasm of respiratory organs: Secondary | ICD-10-CM

## 2024-08-01 DIAGNOSIS — Z87891 Personal history of nicotine dependence: Secondary | ICD-10-CM

## 2024-08-06 ENCOUNTER — Telehealth: Payer: Self-pay | Admitting: Acute Care

## 2024-08-06 NOTE — Telephone Encounter (Signed)
 I have attempted to call the patient with the results of their  Low Dose CT Chest Lung cancer screening scan. There was no answer. I have left a HIPPA compliant VM requesting the patient call the office for the scan results. I included the office contact information in the message. We will await his return call. If no return call we will continue to call until patient is contacted.    This is a 4 B. First lung cancer screening scan. There is a Thick-walled cavitary lesion in the left upper lobe measures approximately 3.3 x 2.5 cm. This previously measured 1.4 x 1.2 cm in 2019. Walls are irregular and surrounding margins are spiculated. This has increased in size with progressive morphology concerning for malignancy. This was previously biopsied in 2019 and was AFB positive. While this could possibly  represent progression of infectious etiology, increased size and irregular morphology raises concern for malignancy.He has continued to smoke, and has a 106 pack year smoking history.   Additionally there were 2 additional nodules , both read as a LR 3, 7.2 mm and 6.6 mm   He needs to be seen in [place] by Dr. Catherine to be further evaluated.  After we reach the patient , we will make sure his PCP is aware of the results and the plan.   Dr. Catherine , let me know if you have any questions . Once we get in touch with the patient, we will get him scheduled with you.   Thanks all.

## 2024-08-06 NOTE — Telephone Encounter (Signed)
 Received call report LDCT:  IMPRESSION: 1. Lung-RADS 4B, suspicious. Additional imaging evaluation or consultation with Pulmonology or Thoracic Surgery recommended. Left upper lobe cavitary nodule has increased in size with surrounding irregular margins and spiculation since 2019. This was previously biopsied in 2019 and was AFB positive, however no interval exams are available. While this may represent progression of infectious etiology, increased size and irregular morphology raises concern for malignancy. The second cavitary lesion on prior exam in the right lower lobe has diminished in size in the interim. 2. There are 2 additional lung-RADS 3 nodules in the right lower lobe., 7.2 and 6.6 mm. Six-month low-dose follow-up CT is recommended if no further evaluation of the cavitary nodules is planned. 3. Coronary artery calcifications. 4. Aortic Atherosclerosis (ICD10-I70.0) and Emphysema (ICD10-J43.9).   These results will be called to the ordering clinician or representative by the Radiologist Assistant, and communication documented in the PACS or [employer].     Electronically Signed   By: Andrea Gasman M.D.   On: 08/06/2024 11:22

## 2024-08-07 NOTE — Telephone Encounter (Signed)
 See other telephone note from 08/06/24.

## 2024-08-07 NOTE — Telephone Encounter (Signed)
 We will keep you informed of his results and further recommendations. I'll attempt to be gentle in delivering the news to him. Hopefully, not so stressful for him.

## 2024-08-08 NOTE — Telephone Encounter (Signed)
 Spoke with patient's daughter, Lacinda. Advised if patient has any questions or would like to speak with Lauraine prior to the result appt he may call us  directly at [phone]. His daughter can be on the line as well to review patient's results if permitted by patient. Pt can also call back and speak with an RN or Lauraine if needed prior to the follow up appt. Patient was not with daughter at time of call. She lives several hours away form patient.

## 2024-08-08 NOTE — Telephone Encounter (Signed)
 Copied from CRM #[id number]. Topic: Clinical - Lab/Test Results >> Aug 06, 2024  4:22 PM Joesph PARAS wrote: Reason for CRM: Patients daughter calling regarding lung cancer screening results. Informed patient's daughter that I am unable to disclose results without an updated DPR with her on file. Patient's daughter expressed understanding and will speak with patient about getting documents updated or having patient call for results.   Number called by NP Ruthell is daughter's number as a note.

## 2024-08-17 NOTE — Progress Notes (Unsigned)
" ° °  Established Patient Pulmonology Office Visit   Subjective:  Patient ID: Derek [name], male    DOB: 09-06-1960  MRN: [id number]  CC: No chief complaint on file.   HPI  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   LUL cavitary lesion was previoulsy smaller but present in 2019. LUL navigational bronchoscopy completed in 2019 and benign pathology. Microbiology grew MAC. Tx for MAC by ID.  {PULM QUESTIONNAIRES (Optional):33196}  ROS  {History (Optional):23778} Current Medications[1]      Objective:  There were no vitals taken for this visit. {Pulm Vitals (Optional):32837}  Physical Exam   Diagnostic Review:  {Labs (Optional):32838}  LDCT 08/01/2024: IMPRESSION: 1. Lung-RADS 4B, suspicious. Additional imaging evaluation or consultation with Pulmonology or Thoracic Surgery recommended. Left upper lobe cavitary nodule has increased in size with surrounding irregular margins and spiculation since 2019. This was previously biopsied in 2019 and was AFB positive, however no interval exams are available. While this may represent progression of infectious etiology, increased size and irregular morphology raises concern for malignancy. The second cavitary lesion on prior exam in the right lower lobe has diminished in size in the interim. 2. There are 2 additional lung-RADS 3 nodules in the right lower lobe., 7.2 and 6.6 mm. Six-month low-dose follow-up CT is recommended if no further evaluation of the cavitary nodules is planned. 3. Coronary artery calcifications. 4. Aortic Atherosclerosis (ICD10-I70.0) and Emphysema (ICD10-J43.9).      Assessment & Plan:   Assessment & Plan Mycobacterium avium infection (HCC)  Asthmatic bronchitis , chronic (HCC)  COPD GOLD 4/ AB   No orders of the defined types were  placed in this encounter.     No follow-ups on file.   Derek Vandam, MD    [1]  Current Outpatient Medications:    albuterol  (PROVENTIL  HFA;VENTOLIN  HFA) 108 (90 Base) MCG/ACT inhaler, Inhale 2 puffs into the lungs every 6 (six) hours as needed for wheezing or shortness of breath., Disp: 3 Inhaler, Rfl: 1   albuterol  (PROVENTIL ) (2.5 MG/3ML) 0.083% nebulizer solution, Take 3 mLs (2.5 mg total) by nebulization every 4 (four) hours as needed for wheezing or shortness of breath., Disp: 150 mL, Rfl: 11   budesonide -formoterol  (SYMBICORT ) 80-4.5 MCG/ACT inhaler, Take 2 puffs first thing in am and then another 2 puffs about 12 hours later., Disp: 1 each, Rfl: 11   Cyanocobalamin (VITAMIN B-12 PO), Take 1 tablet by mouth daily., Disp: , Rfl:    famotidine  (PEPCID ) 20 MG tablet, Take 1 tablet (20 mg total) by mouth 2 (two) times daily., Disp: 30 tablet, Rfl: 3   metoprolol succinate (TOPROL-XL) 50 MG 24 hr tablet, Take 50 mg by mouth daily., Disp: , Rfl:    Tiotropium Bromide (SPIRIVA  RESPIMAT) 2.5 MCG/ACT AERS, Inhale 2 puffs into the lungs daily., Disp: 4 g, Rfl: 11   Tiotropium Bromide (SPIRIVA  RESPIMAT) 2.5 MCG/ACT AERS, Inhale 2 puffs into the lungs daily., Disp: , Rfl:    Tiotropium Bromide Monohydrate  (SPIRIVA  RESPIMAT) 2.5 MCG/ACT AERS, 2 puffs each am (Patient not taking: Reported on 07/18/2024), Disp: 1 each, Rfl: 11  "

## 2024-08-17 NOTE — H&P (View-Only) (Signed)
" ° °  Established Patient Pulmonology Office Visit   Subjective:  Patient ID: Derek [name], male    DOB: 09-06-1960  MRN: [id number]  CC: No chief complaint on file.   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   LUL cavitary lesion was previoulsy smaller but present in 2019. LUL navigational bronchoscopy completed in 2019 and benign pathology. Microbiology grew MAC. Tx for MAC by ID.  {PULM QUESTIONNAIRES (Optional):33196}  ROS  {History (Optional):23778} Current Medications[1]      Objective:  There were no vitals taken for this visit. {Pulm Vitals (Optional):32837}  Physical Exam   Diagnostic Review:  {Labs (Optional):32838}  LDCT 08/01/2024: IMPRESSION: 1. Lung-RADS 4B, suspicious. Additional imaging evaluation or consultation with Pulmonology or Thoracic Surgery recommended. Left upper lobe cavitary nodule has increased in size with surrounding irregular margins and spiculation since 2019. This was previously biopsied in 2019 and was AFB positive, however no interval exams are available. While this may represent progression of infectious etiology, increased size and irregular morphology raises concern for malignancy. The second cavitary lesion on prior exam in the right lower lobe has diminished in size in the interim. 2. There are 2 additional lung-RADS 3 nodules in the right lower lobe., 7.2 and 6.6 mm. Six-month low-dose follow-up CT is recommended if no further evaluation of the cavitary nodules is planned. 3. Coronary artery calcifications. 4. Aortic Atherosclerosis (ICD10-I70.0) and Emphysema (ICD10-J43.9).      Assessment & Plan:   Assessment & Plan Mycobacterium avium infection (HCC)  Asthmatic bronchitis , chronic (HCC)  COPD GOLD 4/ AB   No orders of the defined types were  placed in this encounter.     No follow-ups on file.   [name], MD    [1]  Current Outpatient Medications:    albuterol  (PROVENTIL  HFA;VENTOLIN  HFA) 108 (90 Base) MCG/ACT inhaler, Inhale 2 puffs into the lungs every 6 (six) hours as needed for wheezing or shortness of breath., Disp: 3 Inhaler, Rfl: 1   albuterol  (PROVENTIL ) (2.5 MG/3ML) 0.083% nebulizer solution, Take 3 mLs (2.5 mg total) by nebulization every 4 (four) hours as needed for wheezing or shortness of breath., Disp: 150 mL, Rfl: 11   budesonide -formoterol  (SYMBICORT ) 80-4.5 MCG/ACT inhaler, Take 2 puffs first thing in am and then another 2 puffs about 12 hours later., Disp: 1 each, Rfl: 11   Cyanocobalamin (VITAMIN B-12 PO), Take 1 tablet by mouth daily., Disp: , Rfl:    famotidine  (PEPCID ) 20 MG tablet, Take 1 tablet (20 mg total) by mouth 2 (two) times daily., Disp: 30 tablet, Rfl: 3   metoprolol succinate (TOPROL-XL) 50 MG 24 hr tablet, Take 50 mg by mouth daily., Disp: , Rfl:    Tiotropium Bromide (SPIRIVA  RESPIMAT) 2.5 MCG/ACT AERS, Inhale 2 puffs into the lungs daily., Disp: 4 g, Rfl: 11   Tiotropium Bromide (SPIRIVA  RESPIMAT) 2.5 MCG/ACT AERS, Inhale 2 puffs into the lungs daily., Disp: , Rfl:    Tiotropium Bromide Monohydrate  (SPIRIVA  RESPIMAT) 2.5 MCG/ACT AERS, 2 puffs each am (Patient not taking: Reported on 07/18/2024), Disp: 1 each, Rfl: 11  "

## 2024-08-18 ENCOUNTER — Encounter: Payer: Self-pay | Admitting: Pulmonary Disease

## 2024-08-18 ENCOUNTER — Ambulatory Visit (INDEPENDENT_AMBULATORY_CARE_PROVIDER_SITE_OTHER): Admitting: Pulmonary Disease

## 2024-08-18 ENCOUNTER — Telehealth: Payer: Self-pay | Admitting: Pulmonary Disease

## 2024-08-18 VITALS — BP 154/86 | HR 103 | Ht 71.0 in | Wt 154.0 lb

## 2024-08-18 DIAGNOSIS — R918 Other nonspecific abnormal finding of lung field: Secondary | ICD-10-CM | POA: Insufficient documentation

## 2024-08-18 DIAGNOSIS — Z8709 Personal history of other diseases of the respiratory system: Secondary | ICD-10-CM | POA: Diagnosis not present

## 2024-08-18 DIAGNOSIS — J449 Chronic obstructive pulmonary disease, unspecified: Secondary | ICD-10-CM

## 2024-08-18 MED ORDER — BREZTRI AEROSPHERE 160-9-4.8 MCG/ACT IN AERO: 2.0000 | INHALATION_SPRAY | Freq: Two times a day (BID) | RESPIRATORY_TRACT | Status: AC

## 2024-08-18 MED ORDER — BREZTRI AEROSPHERE 160-9-4.8 MCG/ACT IN AERO: 2.0000 | INHALATION_SPRAY | Freq: Two times a day (BID) | RESPIRATORY_TRACT | 6 refills | Status: AC

## 2024-08-18 NOTE — Telephone Encounter (Signed)
 Please schedule the following:  Provider performing procedure:Derek [name] Diagnosis: lung mass Which side if for nodule / mass? Right Procedure: navigational bronch + EBUS  Has patient been spoken to by Provider and given informed consent? Yes Anesthesia: general Do you need Fluro? Yes, cios Duration of procedure: 1.5 hours Date: 09/06/2023 Alternate Date: 09/09/2023  Time: prefers appt after 9 AM Location: [facility] Does patient have OSA? No DM? No Or Latex allergy? No Medication Restriction/ Anticoagulate/Antiplatelet: N/A Pre-op Labs Ordered:determined by Anesthesia Imaging request: SUPER D and PET/CT  (If, SuperDimension CT Chest, please have STAT courier sent to ENDO)

## 2024-08-18 NOTE — Patient Instructions (Signed)
" °  VISIT SUMMARY: You visited us  today to evaluate enlarging lung nodules, which may be due to either a recurrence of your previous Mycobacterium avium complex (MAC) infection or possible lung cancer. You have been experiencing shortness of breath, weakness, significant weight loss, night sweats, and occasional nocturnal breathing difficulties.  YOUR PLAN: ENLARGING PULMONARY NODULES: Your lung nodules have grown despite previous treatment for MAC, raising concerns about possible lung cancer or a recurrence of the infection. -A CT scan has been ordered to guide a biopsy procedure. -A biopsy using a robotic scope with GPS mapping is scheduled for January 9th in [place]. -A PET scan has been ordered to check for metastasis or other areas of concern. -Please fast from midnight before the procedure and arrange for transportation afterward. -If the biopsy confirms MAC, we may continue antibiotic treatment, possibly using amikacin via nebulizer.  CHRONIC OBSTRUCTIVE PULMONARY DISEASE (COPD): You have COPD with symptoms of shortness of breath, a long-term smoking history, and exposure to silica dust. -Continue using your current inhaler regimen with Symbicort  and albuterol  nebulizer.  Call 1-800 quit now to get tobacco treatment products to assist with smoking cessation. Contains text generated by [employer].  "

## 2024-08-18 NOTE — Telephone Encounter (Signed)
 Letter given to [employer] Case # [id number]. Sending to [employer] to [employer]

## 2024-08-18 NOTE — Assessment & Plan Note (Signed)
 Chronic obstructive pulmonary disease COPD with shortness of breath, long-term smoking history, and silica exposure. Current inhaler regimen includes Symbicort  and albuterol  nebulizer with inconsistent adherence. Grade 4, Class B. Will switch Symbicort  to Breztri  inhaler. - Breztri  2 puffs BID - Continue albuterol  MDI as needed - Continue nebulizer treatment, encouraged him to use 3 times daily

## 2024-08-18 NOTE — Assessment & Plan Note (Signed)
 Prior hx of NTM that was treated as noted above. Will repeat biopsy and then decide on further treatment if needed.

## 2024-08-18 NOTE — Assessment & Plan Note (Signed)
 Enlarging pulmonary nodules, possible malignancy versus recurrent Mycobacterium avium complex infection The patient has suspicious cavitary lung mass in left upper lobe that seems to have enlarged dramatically compared to CT chest in 2019. There's also another RLL cavitary solitary pulmonary nodule that looks more stable. Differential includes recurrent MAC versus lung cancer. Previous biopsy in 2019 was c/w MAC infection and patient was treated on and off for three years. Current imaging shows significant growth, necessitating further investigation. - Super D and PET/CT prior to biopsy - Scheduled biopsy using robotic navigational bronchoscopy on January 9th, [facility]. - Instructed to fast from midnight before the procedure and arrange for transportation post-procedure.

## 2024-08-19 ENCOUNTER — Telehealth: Payer: Self-pay | Admitting: Pulmonary Disease

## 2024-08-19 NOTE — Telephone Encounter (Signed)
 LVM for patient informing him we are cancelling the CT Super D chest scheduled for Wednesday 08/20/24 at 4:00pm.  Requested return call with questions or concerns

## 2024-08-20 ENCOUNTER — Encounter (HOSPITAL_COMMUNITY): Payer: Self-pay

## 2024-08-20 ENCOUNTER — Ambulatory Visit (HOSPITAL_COMMUNITY)

## 2024-08-24 ENCOUNTER — Encounter (HOSPITAL_COMMUNITY): Payer: Self-pay

## 2024-08-24 ENCOUNTER — Ambulatory Visit (HOSPITAL_COMMUNITY)
Admission: RE | Admit: 2024-08-24 | Discharge: 2024-08-24 | Disposition: A | Discharge status: Home / Self Care | Source: Ambulatory Visit | Attending: Pulmonary Disease | Admitting: Pulmonary Disease

## 2024-08-24 DIAGNOSIS — R918 Other nonspecific abnormal finding of lung field: Secondary | ICD-10-CM | POA: Insufficient documentation

## 2024-08-26 ENCOUNTER — Telehealth: Payer: Self-pay

## 2024-08-26 ENCOUNTER — Other Ambulatory Visit (HOSPITAL_COMMUNITY): Payer: Self-pay

## 2024-08-26 NOTE — Telephone Encounter (Signed)
*  Pulm  Pharmacy Patient Advocate Encounter   Received notification from Fax that prior authorization for Breztri  is required/requested.   Insurance verification completed.   The patient is insured through [facility] MEDICAID.   Per test claim: PA required; PA submitted to above mentioned insurance via Latent Key/confirmation #/EOC [id number] Status is pending

## 2024-08-26 NOTE — Telephone Encounter (Signed)
 Your request has been approved Approved. This drug has been approved. Approved quantity: 10.7 units per 30 day(s). The drug has been approved from 08/26/2024 to 08/26/2025. Please call the pharmacy to process your prescription claim. Generic or biosimilar substitution may be required when available and preferred on the formulary. Authorization Expiration12/30/2026

## 2024-08-26 NOTE — Telephone Encounter (Signed)
 Copied from CRM #[id number]. Topic: Clinical - Medical Advice >> Aug 22, 2024  4:13 PM Joesph PARAS wrote: Reason for CRM: Representative calling with third party to state that Dr. Catherine is being offered the chance to upload more information regarding patient's CT scan. Provided peer to peer number and tracking for the patient. Peer to Peer Number for upcoming CT is [phone], tracking IS [id number].

## 2024-08-26 NOTE — Telephone Encounter (Signed)
 Clotilda, do you think the ION system can take his last Low dose CT chest instead of doing another super D CT chest?

## 2024-09-04 ENCOUNTER — Other Ambulatory Visit: Payer: Self-pay

## 2024-09-04 ENCOUNTER — Encounter (HOSPITAL_COMMUNITY): Payer: Self-pay | Admitting: Pulmonary Disease

## 2024-09-04 NOTE — Progress Notes (Signed)
 SDW CALL  Patient was given pre-op instructions over the phone. The opportunity was given for the patient to ask questions. No further questions asked. Patient verbalized understanding of instructions given.   PCP - Dr. Yancey [place] Cardiologist - denies  PPM/ICD - denies Device Orders -  Rep Notified -   Chest x-ray - j1/15/25 CE EKG - 09/12/23 CE-requested Stress Test - denies ECHO - denies Cardiac Cath - denies  Sleep Study - denies CPAP -   Fasting Blood Sugar - na Checks Blood Sugar _____ times a day  Blood Thinner Instructions:na Aspirin Instructions:na  ERAS Protcol -NPO PRE-SURGERY Ensure or G2-   COVID TEST- na   Anesthesia review: no  Patient denies shortness of breath, fever, cough and chest pain over the phone call  As of today, STOP taking any Aspirin (unless otherwise instructed by your surgeon) Aleve, Naproxen, Ibuprofen, Motrin, Advil, Goody's, BC's, all herbal medications, fish oil, and all vitamins.   Oral Hygiene is also important to reduce your risk of infection.  Remember - BRUSH YOUR TEETH THE MORNING OF SURGERY WITH YOUR REGULAR TOOTHPASTE

## 2024-09-05 ENCOUNTER — Ambulatory Visit (HOSPITAL_COMMUNITY)

## 2024-09-05 ENCOUNTER — Encounter (HOSPITAL_COMMUNITY): Payer: Self-pay | Admitting: Pulmonary Disease

## 2024-09-05 ENCOUNTER — Ambulatory Visit (HOSPITAL_COMMUNITY)
Admission: RE | Admit: 2024-09-05 | Discharge: 2024-09-05 | Disposition: A | Discharge status: Home / Self Care | Attending: Pulmonary Disease | Admitting: Pulmonary Disease

## 2024-09-05 ENCOUNTER — Ambulatory Visit (HOSPITAL_COMMUNITY): Payer: Self-pay | Admitting: Physician Assistant

## 2024-09-05 ENCOUNTER — Encounter (HOSPITAL_COMMUNITY)
Admission: RE | Disposition: A | Discharge status: Home / Self Care | Payer: Self-pay | Source: Home / Self Care | Attending: Pulmonary Disease

## 2024-09-05 ENCOUNTER — Other Ambulatory Visit: Payer: Self-pay

## 2024-09-05 DIAGNOSIS — J449 Chronic obstructive pulmonary disease, unspecified: Secondary | ICD-10-CM

## 2024-09-05 DIAGNOSIS — F17210 Nicotine dependence, cigarettes, uncomplicated: Secondary | ICD-10-CM

## 2024-09-05 DIAGNOSIS — R911 Solitary pulmonary nodule: Secondary | ICD-10-CM | POA: Diagnosis not present

## 2024-09-05 DIAGNOSIS — R918 Other nonspecific abnormal finding of lung field: Secondary | ICD-10-CM | POA: Diagnosis present

## 2024-09-05 DIAGNOSIS — Z79899 Other long term (current) drug therapy: Secondary | ICD-10-CM | POA: Diagnosis not present

## 2024-09-05 DIAGNOSIS — Z800 Family history of malignant neoplasm of digestive organs: Secondary | ICD-10-CM | POA: Insufficient documentation

## 2024-09-05 DIAGNOSIS — J439 Emphysema, unspecified: Secondary | ICD-10-CM | POA: Diagnosis not present

## 2024-09-05 DIAGNOSIS — I10 Essential (primary) hypertension: Secondary | ICD-10-CM | POA: Insufficient documentation

## 2024-09-05 DIAGNOSIS — Z8619 Personal history of other infectious and parasitic diseases: Secondary | ICD-10-CM | POA: Insufficient documentation

## 2024-09-05 DIAGNOSIS — A310 Pulmonary mycobacterial infection: Secondary | ICD-10-CM

## 2024-09-05 HISTORY — PX: CRYOTHERAPY: SHX6894

## 2024-09-05 HISTORY — PX: BRONCHIAL NEEDLE ASPIRATION BIOPSY: SHX5106

## 2024-09-05 HISTORY — PX: BRONCHIAL WASHINGS: SHX5105

## 2024-09-05 HISTORY — PX: VIDEO BRONCHOSCOPY WITH ENDOBRONCHIAL NAVIGATION: SHX6175

## 2024-09-05 HISTORY — PX: BRONCHIAL BRUSHINGS: SHX5108

## 2024-09-05 LAB — COMPREHENSIVE METABOLIC PANEL WITH GFR
ALT: 16 U/L (ref 0–44)
AST: 32 U/L (ref 15–41)
Albumin: 3.5 g/dL (ref 3.5–5.0)
Alkaline Phosphatase: 134 U/L — ABNORMAL HIGH (ref 38–126)
Anion gap: 11 (ref 5–15)
BUN: 8 mg/dL (ref 8–23)
CO2: 26 mmol/L (ref 22–32)
Calcium: 9.4 mg/dL (ref 8.9–10.3)
Chloride: 100 mmol/L (ref 98–111)
Creatinine, Ser: 0.64 mg/dL (ref 0.61–1.24)
GFR, Estimated: >60 mL/min
Glucose, Bld: 82 mg/dL (ref 70–99)
Potassium: 4.5 mmol/L (ref 3.5–5.1)
Sodium: 137 mmol/L (ref 135–145)
Total Bilirubin: 0.4 mg/dL (ref 0.0–1.2)
Total Protein: 7.4 g/dL (ref 6.5–8.1)

## 2024-09-05 LAB — CBC
HCT: 54.7 % — ABNORMAL HIGH (ref 39.0–52.0)
Hemoglobin: 18.1 g/dL — ABNORMAL HIGH (ref 13.0–17.0)
MCH: 35.1 pg — ABNORMAL HIGH (ref 26.0–34.0)
MCHC: 33.1 g/dL (ref 30.0–36.0)
MCV: 106.2 fL — ABNORMAL HIGH (ref 80.0–100.0)
Platelets: 307 K/uL (ref 150–400)
RBC: 5.15 MIL/uL (ref 4.22–5.81)
RDW: 13.2 % (ref 11.5–15.5)
WBC: 11 K/uL — ABNORMAL HIGH (ref 4.0–10.5)
nRBC: 0 % (ref 0.0–0.2)

## 2024-09-05 MED ORDER — PROPOFOL 10 MG/ML IV BOLUS
INTRAVENOUS | Status: DC | PRN
  Administered 2024-09-05: 50 mg via INTRAVENOUS
  Administered 2024-09-05: 100 mg via INTRAVENOUS
  Administered 2024-09-05: 50 mg via INTRAVENOUS

## 2024-09-05 MED ORDER — CHLORHEXIDINE GLUCONATE 0.12 % MT SOLN
OROMUCOSAL | Status: AC
  Filled 2024-09-05: qty 15

## 2024-09-05 MED ORDER — SODIUM CHLORIDE 0.9 % IV SOLN: Freq: Once | INTRAVENOUS | Status: DC

## 2024-09-05 MED ORDER — SUGAMMADEX SODIUM 200 MG/2ML IV SOLN
INTRAVENOUS | Status: DC | PRN
  Administered 2024-09-05: 200 mg via INTRAVENOUS

## 2024-09-05 MED ORDER — LIDOCAINE 2% (20 MG/ML) 5 ML SYRINGE
INTRAMUSCULAR | Status: DC | PRN
  Administered 2024-09-05: 100 mg via INTRAVENOUS

## 2024-09-05 MED ORDER — CHLORHEXIDINE GLUCONATE 0.12 % MT SOLN
15.0000 mL | Freq: Once | OROMUCOSAL | Status: AC
  Administered 2024-09-05: 15 mL via OROMUCOSAL

## 2024-09-05 MED ORDER — DEXAMETHASONE SOD PHOSPHATE PF 10 MG/ML IJ SOLN
INTRAMUSCULAR | Status: DC | PRN
  Administered 2024-09-05: 10 mg via INTRAVENOUS

## 2024-09-05 MED ORDER — PHENYLEPHRINE HCL-NACL 20-0.9 MG/250ML-% IV SOLN
INTRAVENOUS | Status: DC | PRN
  Administered 2024-09-05: 40 ug via INTRAVENOUS

## 2024-09-05 MED ORDER — ROCURONIUM BROMIDE 10 MG/ML (PF) SYRINGE
PREFILLED_SYRINGE | INTRAVENOUS | Status: DC | PRN
  Administered 2024-09-05: 60 mg via INTRAVENOUS
  Administered 2024-09-05: 10 mg via INTRAVENOUS

## 2024-09-05 MED ORDER — ONDANSETRON HCL 4 MG/2ML IJ SOLN
INTRAMUSCULAR | Status: DC | PRN
  Administered 2024-09-05: 4 mg via INTRAVENOUS

## 2024-09-05 MED ORDER — PHENYLEPHRINE 80 MCG/ML (10ML) SYRINGE FOR IV PUSH (FOR BLOOD PRESSURE SUPPORT)
PREFILLED_SYRINGE | INTRAVENOUS | Status: DC | PRN
  Administered 2024-09-05: 160 ug via INTRAVENOUS

## 2024-09-05 MED ORDER — PROPOFOL 500 MG/50ML IV EMUL
INTRAVENOUS | Status: DC | PRN
  Administered 2024-09-05: 125 ug/kg/min via INTRAVENOUS

## 2024-09-05 MED ORDER — LACTATED RINGERS IV SOLN: INTRAVENOUS | Status: DC

## 2024-09-05 NOTE — Discharge Instructions (Signed)

## 2024-09-05 NOTE — Transfer of Care (Signed)
 Immediate Anesthesia Transfer of Care Note  Patient: Derek [name]  Procedure(s) Performed: VIDEO BRONCHOSCOPY WITH ENDOBRONCHIAL NAVIGATION (Bilateral) IRRIGATION, BRONCHUS (Bilateral) BRONCHOSCOPY, WITH NEEDLE ASPIRATION BIOPSY BRONCHOSCOPY, WITH BRUSH BIOPSY CRYOTHERAPY  Patient Location: PACU  Anesthesia Type:General  Level of Consciousness: awake, alert , and oriented  Airway & Oxygen Therapy: Patient Spontanous Breathing and Patient connected to face mask oxygen  Post-op Assessment: Report given to RN and Post -op Vital signs reviewed and stable  Post vital signs: Reviewed and stable  Last Vitals:  Vitals Value Taken Time  BP 104/70 09/05/24 12:01  Temp 36.5 C 09/05/24 11:57  Pulse 89 09/05/24 12:03  Resp 16 09/05/24 12:03  SpO2 88 % 09/05/24 12:03  Vitals shown include unfiled device data.  Last Pain:  Vitals:   09/05/24 1157  TempSrc: Temporal  PainSc: 0-No pain      Patients Stated Pain Goal: 0 (09/05/24 9076)  Complications: No notable events documented.

## 2024-09-05 NOTE — Anesthesia Procedure Notes (Signed)
 Procedure Name: Intubation Date/Time: 09/05/2024 10:24 AM  Performed by: Elby Raelene SAUNDERS, CRNAPre-anesthesia Checklist: Patient identified, Emergency Drugs available, Suction available and Patient being monitored Patient Re-evaluated:Patient Re-evaluated prior to induction Oxygen Delivery Method: Circle System Utilized Preoxygenation: Pre-oxygenation with 100% oxygen Induction Type: IV induction Ventilation: Mask ventilation without difficulty Laryngoscope Size: Miller and 2 Grade View: Grade II Tube type: Oral Tube size: 8.5 mm Number of attempts: 1 Airway Equipment and Method: Stylet and Bite block Placement Confirmation: ETT inserted through vocal cords under direct vision, positive ETCO2 and breath sounds checked- equal and bilateral Secured at: 23 cm Tube secured with: Tape Dental Injury: Teeth and Oropharynx as per pre-operative assessment

## 2024-09-05 NOTE — Anesthesia Preprocedure Evaluation (Addendum)
"                                    Anesthesia Evaluation  Patient identified by MRN, date of birth, ID band  Reviewed: Allergy & Precautions, NPO status , Patient's Chart, lab work & pertinent test results, reviewed documented beta blocker date and time   History of Anesthesia Complications Negative for: history of anesthetic complications  Airway Mallampati: I  TM Distance: <3 FB Neck ROM: Full    Dental  (+) Teeth Intact, Dental Advisory Given, Poor Dentition   Pulmonary COPD, Current Smoker and Patient abstained from smoking.  Enlarging pulmonary nodules, possible malignancy versus recurrent Mycobacterium avium complex infection    breath sounds clear to auscultation       Cardiovascular hypertension, Pt. on medications and Pt. on home beta blockers  Rhythm:Regular Rate:Normal     Neuro/Psych    GI/Hepatic ,GERD  Medicated and Controlled,,(+)     substance abuse  , Hepatitis -, C  Endo/Other    Renal/GU      Musculoskeletal   Abdominal   Peds  Hematology   Anesthesia Other Findings   Reproductive/Obstetrics                              Anesthesia Physical Anesthesia Plan  ASA: 3  Anesthesia Plan: General   Post-op Pain Management:    Induction: Intravenous  PONV Risk Score and Plan: 1 and Propofol  infusion and Treatment may vary due to age or medical condition  Airway Management Planned: Oral ETT  Additional Equipment: None  Intra-op Plan:   Post-operative Plan: Extubation in OR  Informed Consent: I have reviewed the patients History and Physical, chart, labs and discussed the procedure including the risks, benefits and alternatives for the proposed anesthesia with the patient or authorized representative who has indicated his/her understanding and acceptance.     Dental advisory given  Plan Discussed with: CRNA  Anesthesia Plan Comments:          Anesthesia Quick Evaluation  "

## 2024-09-05 NOTE — Interval H&P Note (Signed)
 History and Physical Interval Note:  09/05/2024 9:33 AM  Derek [name]  has presented today for surgery, with the diagnosis of lung mass.  The various methods of treatment have been discussed with the patient and family. After consideration of risks, benefits and other options for treatment, the patient has consented to  Procedures: VIDEO BRONCHOSCOPY WITH ENDOBRONCHIAL NAVIGATION (Bilateral) ENDOBRONCHIAL ULTRASOUND (EBUS) (Bilateral) as a surgical intervention.  The patient's history has been reviewed, patient examined, no change in status, stable for surgery.  I have reviewed the patient's chart and labs.  Questions were answered to the patient's satisfaction.     Derek [name]

## 2024-09-05 NOTE — Op Note (Signed)
 Video Bronchoscopy with Robotic Assisted Bronchoscopic Navigation   Date of Operation: 09/05/2024   Pre-op Diagnosis: LUL cavitary lesion, RLL solitary pulmonary nodule  Post-op Diagnosis: same  Surgeon: Paula Southerly, MD  Assistants: N/A  Anesthesia: General endotracheal anesthesia  Operation: Flexible video fiberoptic bronchoscopy with robotic assistance and biopsies.  Estimated Blood Loss: Minimal  Complications: None  Indications and History: IVO MOGA is a 64 y.o. male with history of MAC infection, COPD, and cavitary lung lesions.  Recommendation made to achieve a tissue diagnosis via robotic assisted navigational bronchoscopy.  The risks, benefits, complications, treatment options and expected outcomes were discussed with the patient.  The possibilities of pneumothorax, pneumonia, reaction to medication, pulmonary aspiration, perforation of a viscus, bleeding, failure to diagnose a condition and creating a complication requiring transfusion or operation were discussed with the patient who freely signed the consent.    Description of Procedure: The patient was seen in the Preoperative Area, was examined and was deemed appropriate to proceed.  The patient was taken to [facility] Endoscopy room 3, identified as Janine VEAR Tornow and the procedure verified as Flexible Video Fiberoptic Bronchoscopy.  A Time Out was held and the above information confirmed.   Prior to the date of the procedure a high-resolution CT scan of the chest was performed. Utilizing ION software program a virtual tracheobronchial tree was generated to allow the creation of distinct navigation pathways to the patient's parenchymal abnormalities. After being taken to the operating room general anesthesia was initiated and the patient  was orally intubated. The video fiberoptic bronchoscope was introduced via the endotracheal tube and a general inspection was performed which showed normal right and left lung anatomy.  Aspiration of the bilateral mainstems was completed to remove any remaining secretions. Robotic catheter inserted into patient's endotracheal tube.   Target #1 - LUL Cavitary Lesion: The distinct navigation pathways prepared prior to this procedure were then utilized to navigate to patient's lesion identified on CT scan. The robotic catheter was secured into place and the vision probe was withdrawn.  Lesion location was approximated using fluoroscopy.  Local registration and targeting was performed using [employer] Cios mobile C-arm three-dimensional imaging. Under fluoroscopic guidance transbronchial needle biopsies, and transbronchial cryoprobe biopsies were performed to be sent for cytology and pathology.  Needle-in-lesion was confirmed using Cios mobile C-arm.  A bronchioalveolar lavage was performed in the LUL cavitary lesion and sent for microbiology.  Target #2 - RLL solitary pulmonary nodule: The distinct navigation pathways prepared prior to this procedure were then utilized to navigate to patient's lesion identified on CT scan. The robotic catheter was secured into place and the vision probe was withdrawn.  Lesion location was approximated using fluoroscopy.  Local registration and targeting was performed using [employer] Cios mobile C-arm three-dimensional imaging. Under fluoroscopic guidance transbronchial needle brushings, and transbronchial cryoprobe biopsies were performed to be sent for cytology and pathology.  Needle-in-lesion was confirmed using Cios mobile C-arm.  A bronchioalveolar lavage was performed in the RLL solitary pulmonary nodule and sent for cytology and microbiology.  At the end of the procedure a general airway inspection was performed and there was no evidence of active bleeding. The bronchoscope was removed.  The patient tolerated the procedure well. There was no significant blood loss and there were no obvious complications. A post-procedural chest x-ray  is pending.  Samples Target #1: 1. Transbronchial Wang needle biopsies from LUL cavitary lesion 2. Transbronchial cryoprobe biopsies from LUL cavitary lesion 3. Bronchoalveolar lavage from  LUL cavitary lesion  Samples Target #2: 1. Transbronchial needle brushings from RLL SPN 2. Transbronchial cryoprobe biopsies from RLL SPN 3. Bronchoalveolar lavage from RLL SPN  Plans:  The patient will be discharged from the PACU to home when recovered from anesthesia and after chest x-ray is reviewed. We will review the cytology, pathology and microbiology results with the patient when they become available. Outpatient followup will be with me.   Paula Southerly, MD [facility]

## 2024-09-05 NOTE — Op Note (Signed)
 Procedure Note  Patient: Derek [name]  [employer] Cios mobile C-arm was utilized to identify and biopsy LUL cavitary lesion and RLL solitary pulmonary nodule.  Needle-in-lesion was confirmed using real-time Cios imaging, and images were uploaded to PACS.   Target 1 - LUL Cavitary Lesion (tool in lesion)      Target 2 - RLL SPN (tool in lesion)   Paula Southerly, MD [facility]

## 2024-09-07 ENCOUNTER — Encounter (HOSPITAL_COMMUNITY): Payer: Self-pay | Admitting: Pulmonary Disease

## 2024-09-07 LAB — ACID FAST SMEAR (AFB, MYCOBACTERIA)
Acid Fast Smear: NEGATIVE
Acid Fast Smear: NEGATIVE

## 2024-09-07 NOTE — Anesthesia Postprocedure Evaluation (Signed)
"   Anesthesia Post Note  Patient: Derek [name]  Procedure(s) Performed: VIDEO BRONCHOSCOPY WITH ENDOBRONCHIAL NAVIGATION (Bilateral) IRRIGATION, BRONCHUS (Bilateral) BRONCHOSCOPY, WITH NEEDLE ASPIRATION BIOPSY BRONCHOSCOPY, WITH BRUSH BIOPSY CRYOTHERAPY     Patient location during evaluation: PACU Anesthesia Type: General Level of consciousness: awake Pain management: pain level controlled Vital Signs Assessment: post-procedure vital signs reviewed and stable Respiratory status: spontaneous breathing Cardiovascular status: blood pressure returned to baseline Postop Assessment: no apparent nausea or vomiting Anesthetic complications: no   No notable events documented.                Lauraine DASEN Colhoun      "

## 2024-09-08 ENCOUNTER — Ambulatory Visit: Payer: Self-pay | Admitting: Pulmonary Disease

## 2024-09-08 DIAGNOSIS — A310 Pulmonary mycobacterial infection: Secondary | ICD-10-CM

## 2024-09-08 DIAGNOSIS — J151 Pneumonia due to Pseudomonas: Secondary | ICD-10-CM

## 2024-09-08 LAB — CYTOLOGY - NON PAP

## 2024-09-08 MED ORDER — LEVOFLOXACIN 750 MG PO TABS: 750.0000 mg | ORAL_TABLET | Freq: Every day | ORAL | 0 refills | Status: AC

## 2024-09-08 NOTE — Progress Notes (Signed)
 Call Mr. Heymann and please let him know that he needs to start Levofloxacin  because he grew Pseudomonas bacteria in the bronchoscopy cultures. I sent the antibiotic to [facility].

## 2024-09-09 LAB — ASPERGILLUS ANTIGEN, BAL/SERUM
Aspergillus Ag, BAL/Serum: 0.19 {index} (ref 0.00–0.49)
Aspergillus Ag, BAL/Serum: 0.26 {index} (ref 0.00–0.49)

## 2024-09-09 LAB — FUNGUS CULTURE WITH STAIN

## 2024-09-09 LAB — FUNGUS CULTURE RESULT

## 2024-09-10 LAB — AEROBIC/ANAEROBIC CULTURE W GRAM STAIN (SURGICAL/DEEP WOUND)

## 2024-09-11 ENCOUNTER — Ambulatory Visit (HOSPITAL_COMMUNITY)
Admission: RE | Admit: 2024-09-11 | Discharge: 2024-09-11 | Disposition: A | Discharge status: Home / Self Care | Source: Ambulatory Visit | Attending: Pulmonary Disease | Admitting: Pulmonary Disease

## 2024-09-11 DIAGNOSIS — J439 Emphysema, unspecified: Secondary | ICD-10-CM | POA: Diagnosis not present

## 2024-09-11 DIAGNOSIS — I2510 Atherosclerotic heart disease of native coronary artery without angina pectoris: Secondary | ICD-10-CM | POA: Insufficient documentation

## 2024-09-11 DIAGNOSIS — K760 Fatty (change of) liver, not elsewhere classified: Secondary | ICD-10-CM | POA: Diagnosis not present

## 2024-09-11 DIAGNOSIS — N400 Enlarged prostate without lower urinary tract symptoms: Secondary | ICD-10-CM | POA: Diagnosis not present

## 2024-09-11 DIAGNOSIS — I700 Atherosclerosis of aorta: Secondary | ICD-10-CM | POA: Insufficient documentation

## 2024-09-11 DIAGNOSIS — R918 Other nonspecific abnormal finding of lung field: Secondary | ICD-10-CM | POA: Diagnosis present

## 2024-09-11 LAB — AEROBIC/ANAEROBIC CULTURE W GRAM STAIN (SURGICAL/DEEP WOUND)

## 2024-09-11 MED ORDER — FLUDEOXYGLUCOSE F - 18 (FDG) INJECTION
7.4100 | Freq: Once | INTRAVENOUS | Status: AC | PRN
  Administered 2024-09-11: 7.41 via INTRAVENOUS

## 2024-09-12 ENCOUNTER — Ambulatory Visit: Admitting: Acute Care

## 2024-09-15 ENCOUNTER — Ambulatory Visit: Admitting: Internal Medicine

## 2024-09-22 ENCOUNTER — Ambulatory Visit: Payer: Self-pay | Admitting: Pulmonary Disease

## 2024-09-22 NOTE — Addendum Note (Signed)
 Addended by: Keaton Beichner on: 09/22/2024 01:29 PM   Modules accepted: Orders

## 2024-09-23 LAB — ACID FAST CULTURE WITH REFLEXED SENSITIVITIES (MYCOBACTERIA)
Acid Fast Culture: POSITIVE — AB
Acid Fast Culture: POSITIVE — AB

## 2024-09-23 LAB — ACID FAST ID BY PCR AND SUSCEPTIBILITIES
M Tuberculosis Complex: NEGATIVE
M Tuberculosis Complex: NEGATIVE
M avium complex: POSITIVE — AB
M avium complex: POSITIVE — AB

## 2024-10-01 ENCOUNTER — Ambulatory Visit: Payer: Self-pay | Admitting: Pulmonary Disease

## 2024-10-01 NOTE — Telephone Encounter (Signed)
 Atc x1 no answer lmtcb and what time the office opens tomorrow   Pt need to be seen tomorrow if we can pls

## 2024-10-01 NOTE — Telephone Encounter (Signed)
 This pt called and is requesting medication for Cough-mucus,SOB  Please advise

## 2024-10-01 NOTE — Telephone Encounter (Addendum)
 FYI Only or Action Required?: Action required by provider: medication refill request. ED advised- declined  Patient is followed in Pulmonology for COPD, last seen on 08/18/2024 by Catherine Cools, MD.  Called Nurse Triage reporting Shortness of Breath.  Symptoms began several days ago.  Interventions attempted: Rescue inhaler, Maintenance inhaler, and Nebulizer treatments.  Symptoms are: gradually worsening.  Triage Disposition: Go to ED Now (Notify PCP)  Patient/caregiver understands and will follow disposition?: No, refuses disposition  E2C2 Pulmonary Triage - Initial Assessment Questions Chief Complaint (e.g., cough, sob, wheezing, fever, chills, sweat or additional symptoms) *Go to specific symptom protocol after initial questions. Cough-mucus,SOB  How long have symptoms been present? 09/09/23- diagnosed with bronchoscopy pseudomonas in his lungs   Have you tested for COVID or Flu? Note: If not, ask patient if a home test can be taken. If so, instruct patient to call back for positive results. No  MEDICINES:   Have you used any OTC meds to help with symptoms? No If yes, ask What medications? na  Have you used your inhalers/maintenance medication? Yes If yes, What medications? Breztri - finished sample Symbicort - daily- no longer on list Albuterol - inhaler/nebulizer-daily  If inhaler, ask How many puffs and how often? Note: Review instructions on medication in the chart. Using nebulizer twice/daily Albuterol  inhaler- 4 times/day Symbicort - 2 puffs am/pm   OXYGEN: Do you wear supplemental oxygen? No If yes, How many liters are you supposed to use? na  Do you monitor your oxygen levels? No If yes, What is your reading (oxygen level) today? Does not have monitor  What is your usual oxygen saturation reading?  (Note: Pulmonary O2 sats should be 90% or greater) unsure    Message from [facility] A sent at 10/01/2024  3:21 PM EST  Reason for  Triage: pt states he was prescribed levofloxacin  (LEVAQUIN ) 750 MG tablet as they found pseudomonas in his lungs when he had bronchoscopy done on 09/05/2024, he states he took this medication for a week and it has since been a week since he finished that dose - he now reports same symptoms returning: SOB, brown phlegm, and night sweats - he is requesting to have this medication prescribed again     Reason for Disposition  [1] MODERATE difficulty breathing (e.g., speaks in phrases, SOB even at rest, pulse 100-120) AND [2] NEW-onset or WORSE than normal  Answer Assessment - Initial Assessment Questions Patient is requesting refill of Levaquin - declines ED disposition, will come for appointment if necessary for refill. Patient advised he would need to be seen- ED declined- will send message to office.  Patient request call back.    1. RESPIRATORY STATUS: Describe your breathing? (e.g., wheezing, shortness of breath, unable to speak, severe coughing)      SOB- symptoms returning after antibiotic treatment 2. ONSET: When did this breathing problem begin?      09/05/24- diagnosis  pseudomonas in his lungs- was prescribed Levaquin   3. PATTERN Does the difficult breathing come and go, or has it been constant since it started?      Comes and goes- has episodes of SOB where he has to use inhaler 4. SEVERITY: How bad is your breathing? (e.g., mild, moderate, severe)      moderate 5. RECURRENT SYMPTOM: Have you had difficulty breathing before? If Yes, ask: When was the last time? and What happened that time?      Yes- see above 6. CARDIAC HISTORY: Do you have any history of heart disease? (e.g., heart attack, angina, bypass  surgery, angioplasty)      no 7. LUNG HISTORY: Do you have any history of lung disease?  (e.g., pulmonary embolus, asthma, emphysema)     COPD, lung mass, smoker 8. CAUSE: What do you think is causing the breathing problem?      Patient feel antibiotic did not  clear infection- requesting refill 9. OTHER SYMPTOMS: Do you have any other symptoms? (e.g., chest pain, cough, dizziness, fever, runny nose)     Cough- heavy brown mucus, SOB- wheezing 10. O2 SATURATION MONITOR:  Do you use an oxygen saturation monitor (pulse oximeter) at home? If Yes, ask: What is your reading (oxygen level) today? What is your usual oxygen saturation reading? (e.g., 95%)       unknown  Protocols used: Breathing Difficulty-A-AH

## 2024-10-02 NOTE — Telephone Encounter (Signed)
 LVM for patient to call and discuss scheduling an appointment for today 10/02/24

## 2024-10-06 ENCOUNTER — Ambulatory Visit: Admitting: Pulmonary Disease

## 2024-10-06 DIAGNOSIS — J4489 Other specified chronic obstructive pulmonary disease: Secondary | ICD-10-CM

## 2024-10-06 DIAGNOSIS — J449 Chronic obstructive pulmonary disease, unspecified: Secondary | ICD-10-CM

## 2024-10-06 DIAGNOSIS — A310 Pulmonary mycobacterial infection: Secondary | ICD-10-CM

## 2024-10-17 ENCOUNTER — Ambulatory Visit: Payer: Self-pay | Admitting: Internal Medicine
